# Patient Record
Sex: Female | Born: 1955
Health system: Southern US, Community
[De-identification: ages and names within clinical notes are randomized; demographics above are authoritative.]

## PROBLEM LIST (undated history)

## (undated) DIAGNOSIS — C801 Malignant (primary) neoplasm, unspecified: Secondary | ICD-10-CM

## (undated) DIAGNOSIS — E669 Obesity, unspecified: Secondary | ICD-10-CM

## (undated) DIAGNOSIS — B009 Herpesviral infection, unspecified: Secondary | ICD-10-CM

## (undated) DIAGNOSIS — N3281 Overactive bladder: Secondary | ICD-10-CM

## (undated) DIAGNOSIS — K219 Gastro-esophageal reflux disease without esophagitis: Secondary | ICD-10-CM

## (undated) DIAGNOSIS — F329 Major depressive disorder, single episode, unspecified: Secondary | ICD-10-CM

## (undated) DIAGNOSIS — M1990 Unspecified osteoarthritis, unspecified site: Secondary | ICD-10-CM

## (undated) DIAGNOSIS — K209 Esophagitis, unspecified: Secondary | ICD-10-CM

## (undated) DIAGNOSIS — R351 Nocturia: Secondary | ICD-10-CM

## (undated) DIAGNOSIS — Z8489 Family history of other specified conditions: Secondary | ICD-10-CM

## (undated) DIAGNOSIS — R350 Frequency of micturition: Secondary | ICD-10-CM

## (undated) DIAGNOSIS — F32A Depression, unspecified: Secondary | ICD-10-CM

## (undated) DIAGNOSIS — N390 Urinary tract infection, site not specified: Secondary | ICD-10-CM

## (undated) DIAGNOSIS — R3915 Urgency of urination: Secondary | ICD-10-CM

## (undated) DIAGNOSIS — K2270 Barrett's esophagus without dysplasia: Secondary | ICD-10-CM

## (undated) DIAGNOSIS — E7800 Pure hypercholesterolemia, unspecified: Secondary | ICD-10-CM

## (undated) DIAGNOSIS — Z5189 Encounter for other specified aftercare: Secondary | ICD-10-CM

## (undated) DIAGNOSIS — IMO0001 Reserved for inherently not codable concepts without codable children: Secondary | ICD-10-CM

## (undated) HISTORY — PX: OTHER SURGICAL HISTORY: SHX169

## (undated) HISTORY — DX: Urinary tract infection, site not specified: N39.0

## (undated) HISTORY — DX: Obesity, unspecified: E66.9

## (undated) HISTORY — DX: Major depressive disorder, single episode, unspecified: F32.9

## (undated) HISTORY — DX: Barrett's esophagus without dysplasia: K22.70

## (undated) HISTORY — DX: Overactive bladder: N32.81

## (undated) HISTORY — PX: KNEE ARTHROSCOPY: SHX127

## (undated) HISTORY — PX: ARTHROSCOPY KNEE W/ DRILLING: SUR92

## (undated) HISTORY — PX: UPPER GASTROINTESTINAL ENDOSCOPY: SHX188

## (undated) HISTORY — DX: Esophagitis, unspecified: K20.9

## (undated) HISTORY — DX: Herpesviral infection, unspecified: B00.9

## (undated) HISTORY — DX: Encounter for other specified aftercare: Z51.89

## (undated) HISTORY — PX: HIP FRACTURE SURGERY: SHX118

## (undated) HISTORY — DX: Depression, unspecified: F32.A

## (undated) HISTORY — PX: COLONOSCOPY: SHX174

## (undated) HISTORY — DX: Malignant (primary) neoplasm, unspecified: C80.1

## (undated) HISTORY — DX: Gastro-esophageal reflux disease without esophagitis: K21.9

## (undated) HISTORY — DX: Unspecified osteoarthritis, unspecified site: M19.90

---
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
## 1993-01-15 HISTORY — PX: SINUS EXPLORATION: SHX5214

## 1998-01-15 HISTORY — PX: CARPAL TUNNEL RELEASE: SHX101

## 1998-04-13 ENCOUNTER — Ambulatory Visit (HOSPITAL_BASED_OUTPATIENT_CLINIC_OR_DEPARTMENT_OTHER): Admission: RE | Admit: 1998-04-13 | Discharge: 1998-04-13 | Payer: Self-pay | Admitting: Orthopedic Surgery

## 1998-08-01 ENCOUNTER — Encounter: Payer: Self-pay | Admitting: Occupational Medicine

## 1998-08-01 ENCOUNTER — Ambulatory Visit: Admission: RE | Admit: 1998-08-01 | Discharge: 1998-08-01 | Payer: Self-pay | Admitting: Occupational Medicine

## 2003-04-16 ENCOUNTER — Ambulatory Visit (HOSPITAL_COMMUNITY): Admission: RE | Admit: 2003-04-16 | Discharge: 2003-04-16 | Payer: Self-pay | Admitting: Gastroenterology

## 2004-02-23 ENCOUNTER — Encounter: Admission: RE | Admit: 2004-02-23 | Discharge: 2004-02-23 | Payer: Self-pay | Admitting: Family Medicine

## 2004-11-29 ENCOUNTER — Encounter: Admission: RE | Admit: 2004-11-29 | Discharge: 2004-11-29 | Payer: Self-pay | Admitting: Family Medicine

## 2004-12-28 ENCOUNTER — Encounter (INDEPENDENT_AMBULATORY_CARE_PROVIDER_SITE_OTHER): Payer: Self-pay | Admitting: *Deleted

## 2004-12-28 ENCOUNTER — Ambulatory Visit (HOSPITAL_COMMUNITY): Admission: RE | Admit: 2004-12-28 | Discharge: 2004-12-28 | Payer: Self-pay | Admitting: Family Medicine

## 2005-01-15 HISTORY — PX: ESOPHAGOGASTRODUODENOSCOPY: SHX1529

## 2005-01-20 ENCOUNTER — Emergency Department (HOSPITAL_COMMUNITY): Admission: EM | Admit: 2005-01-20 | Discharge: 2005-01-20 | Payer: Self-pay | Admitting: Family Medicine

## 2005-06-29 LAB — HM COLONOSCOPY: HM Colonoscopy: NORMAL

## 2005-07-23 ENCOUNTER — Encounter: Admission: RE | Admit: 2005-07-23 | Discharge: 2005-07-23 | Payer: Self-pay | Admitting: Orthopedic Surgery

## 2006-03-26 ENCOUNTER — Emergency Department (HOSPITAL_COMMUNITY): Admission: EM | Admit: 2006-03-26 | Discharge: 2006-03-26 | Payer: Self-pay | Admitting: Emergency Medicine

## 2006-04-16 HISTORY — PX: HEMORRHOID SURGERY: SHX153

## 2007-10-07 ENCOUNTER — Encounter: Admission: RE | Admit: 2007-10-07 | Discharge: 2007-10-07 | Payer: Self-pay | Admitting: Family Medicine

## 2008-04-30 ENCOUNTER — Emergency Department (HOSPITAL_COMMUNITY): Admission: EM | Admit: 2008-04-30 | Discharge: 2008-04-30 | Payer: Self-pay | Admitting: Emergency Medicine

## 2009-02-27 ENCOUNTER — Encounter: Admission: RE | Admit: 2009-02-27 | Discharge: 2009-02-27 | Payer: Self-pay | Admitting: Orthopedic Surgery

## 2009-09-01 ENCOUNTER — Encounter: Admission: RE | Admit: 2009-09-01 | Discharge: 2009-09-01 | Payer: Self-pay | Admitting: Unknown Physician Specialty

## 2010-06-02 NOTE — Op Note (Signed)
Kristi Avila, Kristi Avila                             ACCOUNT NO.:  [id number]   MEDICAL RECORD NO.:  [id number]                   PATIENT TYPE:  AMB   LOCATION:  ENDO                                 FACILITY:  [facility]   PHYSICIAN:  John C. Madilyn Fireman, M.D.                 DATE OF BIRTH:  1955/10/11   DATE OF PROCEDURE:  04/16/2003  DATE OF DISCHARGE:                                 OPERATIVE REPORT   PROCEDURE:  Esophagogastroduodenoscopy with esophageal dilatation.   INDICATION FOR PROCEDURE:  Chronic reflux symptoms and intermittent solid  food dysphagia.   DESCRIPTION OF PROCEDURE:  The patient was placed in the left lateral  decubitus position and placed on the pulse monitor with continuous low-flow  oxygen delivered by nasal cannula.  She was sedated with 100 mcg IV fentanyl  and 10 mg IV Versed.  The Olympus video endoscope was advanced under direct  vision into the oropharynx and esophagus.  The esophagus was straight and of  normal caliber with the squamocolumnar line at 37 cm above a 1.5 cm hiatal  hernia, and there was a circumferential, widely patent but distinct lower  esophageal ring.  There was no visible esophagitis or stricture.  The  stomach was entered, and a small amount of liquid secretions were suctioned  from the fundus.  Retroflexed view of the cardia was unremarkable.  The  fundus, body, antrum, and pylorus all appeared normal.  The duodenum was  entered, and both the bulb and second portion were well-inspected and  appeared to be within normal limits.  The scope was advanced as far as  possible down the distal duodenum and Savary guidewire placed.  The scope  was then withdrawn, and a 17 mm Savary dilator was passed over the guidewire  mild dilatation and no blood seen on withdrawal.  The dilator was removed  together with the wire and the patient returned to the recovery room in  stable condition.  She tolerated the procedure well, and there were no  immediate  complications.   IMPRESSION:  Lower esophageal ring with small hiatal hernia.   PLAN:  1. Advance diet and observe response to dilatation.  2. Continue Protonix which is helping her reflux symptoms.                                               John C. Madilyn Fireman, M.D.    J[facility]/MEDQ  D:  04/16/2003  T:  04/16/2003  Job:  [id number]   cc:   [place]ale Haven. Andrey Campanile, M.D.  [address]  [place]  [place] [address]  Fax: [phone]

## 2012-05-08 ENCOUNTER — Telehealth: Payer: Self-pay | Admitting: Physician Assistant

## 2012-05-09 NOTE — Telephone Encounter (Signed)
1- Bladder Medicine: At OV 03/26/12 started Detrol LA 2mg . This is the low dose. Will increase to next dose--4mg . Tell her if this does not work, let me know and will change to different medicine.  2-Simvastatin: Tell pt that a new warning came out about Simvastatin a while ago. It lists certain meds that are not to be used in combination with Simvastatin. She is not on any of those medicatons. Tell her Simvastatin has been out for >15 years. Millions of people have been on this medicine. She does not need to worry.   Send in new RX for Detrol LA 4mg  one po QD   # 30 / prn refills

## 2012-05-09 NOTE — Telephone Encounter (Signed)
Pt called.  Understands about increase of Detrol.  Then got into discussion about Simvastatin.  Told not on any medications that were of concern in literature.  Pt then stated was worried because when reading and talked to pharmacist, one of the concerns was with patients that drank beer and she drinks beer on the weekends.  Then expressed concern over body and joint aches she is also having and that can be side effect of statins.  She wanted to know if she could use herbal supplement "red yeast"  Told her we usually tell patients with myalgias on statins to stop statin for two weeks (do not replace with anything else) then call us and let us know how they are doing.  She has opted to do that.

## 2012-05-12 ENCOUNTER — Other Ambulatory Visit: Payer: Self-pay | Admitting: Family Medicine

## 2012-05-12 MED ORDER — TOLTERODINE TARTRATE ER 4 MG PO CP24: 4.0000 mg | ORAL_CAPSULE | Freq: Every day | ORAL | Status: DC

## 2012-05-12 NOTE — Telephone Encounter (Signed)
detrol increased to 4mg  /day. Pt states pharmacy never received.  Rx sent again

## 2012-05-24 ENCOUNTER — Emergency Department (INDEPENDENT_AMBULATORY_CARE_PROVIDER_SITE_OTHER)
Admission: EM | Admit: 2012-05-24 | Discharge: 2012-05-24 | Discharge status: Against Medical Advice | Payer: BC Managed Care – PPO | Source: Home / Self Care

## 2012-05-24 ENCOUNTER — Encounter (HOSPITAL_COMMUNITY): Payer: Self-pay

## 2012-05-24 DIAGNOSIS — R110 Nausea: Secondary | ICD-10-CM

## 2012-05-24 DIAGNOSIS — R079 Chest pain, unspecified: Secondary | ICD-10-CM

## 2012-05-24 DIAGNOSIS — R1013 Epigastric pain: Secondary | ICD-10-CM

## 2012-05-24 HISTORY — DX: Pure hypercholesterolemia, unspecified: E78.00

## 2012-05-24 HISTORY — DX: Reserved for inherently not codable concepts without codable children: IMO0001

## 2012-05-24 HISTORY — DX: Gastro-esophageal reflux disease without esophagitis: K21.9

## 2012-05-24 MED ORDER — SODIUM CHLORIDE 0.9 % IV SOLN: Freq: Once | INTRAVENOUS | Status: DC

## 2012-05-24 NOTE — ED Notes (Signed)
Further discussion w patient, signed AMA form; pt understands she is to go to the ED if develops worsening syx

## 2012-05-24 NOTE — ED Provider Notes (Signed)
History     CSN: [id number]  Arrival date & time 05/24/12  1530   First MD Initiated Contact with Patient 05/24/12 1551      Chief Complaint  Patient presents with  . Chest Pain    (Consider location/radiation/quality/duration/timing/severity/associated sxs/prior treatment) HPI Comments: While eating lunch today this 57 year old female experienced sudden acute epigastric pain radiating superiorly retrosternally into the right chest. She describes it as a fullness feeling. It is associated with nausea but no vomiting. She initially thought this was due to her reflux and sheet to GI medications some of which included the Zegerid and Reglan. These did not help her symptoms. She stated she wanted to be sure that she was not having a heart attack. She appears generally well and in no acute distress. She is warm and dry. Showing no signs of respiratory distress.   Past Medical History  Diagnosis Date  . Reflux   . High cholesterol     History reviewed. No pertinent past surgical history.  No family history on file.  History  Substance Use Topics  . Smoking status: Not on file  . Smokeless tobacco: Not on file  . Alcohol Use: Not on file    OB History   Grav Para Term Preterm Abortions TAB SAB Ect Mult Living                  Review of Systems  Constitutional: Negative.   HENT: Negative.   Respiratory: Negative.   Cardiovascular: Positive for chest pain. Negative for palpitations.  Gastrointestinal: Positive for nausea and abdominal pain. Negative for vomiting.  Genitourinary: Negative.   Neurological: Negative.     Allergies  Cephalosporins; Codeine; and Sulfa antibiotics  Home Medications   Current Outpatient Rx  Name  Route  Sig  Dispense  Refill  . fluticasone (FLONASE) 50 MCG/ACT nasal spray   Nasal   Place 2 sprays into the nose daily.         Marland Kitchen acyclovir (ZOVIRAX) 400 MG tablet   Oral   Take 400 mg by mouth every 4 (four) hours while awake.          . DULoxetine (CYMBALTA) 60 MG capsule   Oral   Take 60 mg by mouth daily.         . meloxicam (MOBIC) 15 MG tablet   Oral   Take 15 mg by mouth daily.         . metoCLOPramide (REGLAN) 5 MG tablet   Oral   Take 5 mg by mouth 4 (four) times daily.         Marland Kitchen omeprazole-sodium bicarbonate (ZEGERID) 40-1100 MG per capsule   Oral   Take 1 capsule by mouth daily before breakfast.         . tolterodine (DETROL LA) 4 MG 24 hr capsule   Oral   Take 1 capsule (4 mg total) by mouth daily.   30 capsule   5     BP 112/62  Pulse 69  Temp(Src) 97.9 F (36.6 C) (Oral)  Resp 16  SpO2 100%  Physical Exam  Nursing note and vitals reviewed. Constitutional: She is oriented to person, place, and time. She appears well-developed and well-nourished. No distress.  Eyes: EOM are normal. Pupils are equal, round, and reactive to light.  Neck: Normal range of motion. Neck supple.  Cardiovascular: Normal rate, regular rhythm and intact distal pulses.   Pulmonary/Chest: Effort normal and breath sounds normal. No respiratory distress. She has no  wheezes.  Abdominal: Soft. She exhibits no mass. There is tenderness. There is no rebound.  Epigastric and right upper quadrant tenderness. Positive Murphy sign.  Musculoskeletal: She exhibits no edema and no tenderness.  Lymphadenopathy:    She has no cervical adenopathy.  Neurological: She is alert and oriented to person, place, and time.  Skin: Skin is warm and dry. No rash noted.  Psychiatric: She has a normal mood and affect.    ED Course  Procedures (including critical care time)  Labs Reviewed - No data to display No results found.   1. Chest pain at rest   2. Epigastric abdominal pain   3. Nausea       MDM  This 57 year old female is being transferred to the emergency department for evaluation of epigastric pain radiating into the chest describing it as a fullness. Although she has a normal EKG she has few risk factors  including postmenopausal and dyslipidemia. She is in stable condition with some improvement in the fullness in her chest.         Hayden Rasmussen, NP 05/24/12 1628  Hayden Rasmussen, NP 05/24/12 [phone]

## 2012-05-24 NOTE — ED Provider Notes (Signed)
Medical screening examination/treatment/procedure(s) were performed by non-physician practitioner and as supervising physician I was immediately available for consultation/collaboration.  Leslee Home, M.D.  Reuben Likes, MD 05/24/12 2001

## 2012-05-24 NOTE — ED Notes (Signed)
Went into room to start IV and place on monitor , when she refused to allow IV start, and is declining transfer to ED for further evaluation; wants to make an appt w her PCP on Monday for further testing

## 2012-05-24 NOTE — ED Notes (Signed)
Reports a pressure or heaviness in her epigastric area since lunch today, not improved after taking her PM dose of reglan ; states her pain does not radiate, no sweating or vomiting w the sensation; "feels weird , wanted to have it checked out to be sure I wasn't having a heart attack"

## 2012-05-26 ENCOUNTER — Ambulatory Visit (INDEPENDENT_AMBULATORY_CARE_PROVIDER_SITE_OTHER): Payer: BC Managed Care – PPO | Admitting: Physician Assistant

## 2012-05-26 ENCOUNTER — Encounter: Payer: Self-pay | Admitting: Physician Assistant

## 2012-05-26 VITALS — BP 104/62 | HR 72 | Temp 97.5°F | Resp 18 | Ht 64.75 in | Wt 220.0 lb

## 2012-05-26 DIAGNOSIS — E785 Hyperlipidemia, unspecified: Secondary | ICD-10-CM

## 2012-05-26 DIAGNOSIS — R0789 Other chest pain: Secondary | ICD-10-CM

## 2012-05-26 MED ORDER — PRAVASTATIN SODIUM 10 MG PO TABS: 10.0000 mg | ORAL_TABLET | Freq: Every day | ORAL | Status: DC

## 2012-05-27 NOTE — Progress Notes (Signed)
Patient ID: Kristi Avila MRN: [id number], DOB: 1956-01-01, 57 y.o. Date of Encounter: 05/27/2012, 9:07 AM    Chief Complaint:  Chief Complaint  Patient presents with  . ER folllow up c/o chest pain  . has been off simvastatin  feels great what to do now     HPI: 57 y.o. year old female reports that she went to [facility] over weekend-05/24/12. Had pain in  Right chest and nausea. States that they did EKG but no labs. She was told to f/u here. Says that she has had no further pain or nausea since that one episode. Says she went home and took reflux med and slept with head of bed raised and symptoms resolved. Has had no exertional chest discomfort,  Shortness of breath, nausea, neck or arm discomfort.   Also states that she was having diffuse achiness ove rher entire body. Stopped Simvastatin 2 weeks ago and feels so much better. Myalgias resolved.   Home Meds: See attached medication section for any medications that were entered at today's visit. The computer does not put those onto this list.The following list is a list of meds entered prior to today's visit.   Current Outpatient Prescriptions on File Prior to Visit  Medication Sig Dispense Refill  . acyclovir (ZOVIRAX) 400 MG tablet Take 400 mg by mouth daily.       . DULoxetine (CYMBALTA) 60 MG capsule Take 60 mg by mouth daily.      . fluticasone (FLONASE) 50 MCG/ACT nasal spray Place 2 sprays into the nose daily.      . meloxicam (MOBIC) 15 MG tablet Take 15 mg by mouth daily.      . metoCLOPramide (REGLAN) 5 MG tablet Take 5 mg by mouth 4 (four) times daily.      Marland Kitchen omeprazole-sodium bicarbonate (ZEGERID) 40-1100 MG per capsule Take 1 capsule by mouth daily before breakfast.      . tolterodine (DETROL LA) 4 MG 24 hr capsule Take 1 capsule (4 mg total) by mouth daily.  30 capsule  5   No current facility-administered medications on file prior to visit.    Allergies:  Allergies  Allergen Reactions  . Cephalosporins   . Codeine   .  Simvastatin     myalgias  . Sulfa Antibiotics       Review of Systems: See HPI for pertinent ROS. All other ROS negative.    Physical Exam: Blood pressure 104/62, pulse 72, temperature 97.5 F (36.4 C), temperature source Oral, resp. rate 18, height 5' 4.75" (1.645 m), weight 220 lb (99.791 kg)., Body mass index is 36.88 kg/(m^2). General: Obese WF.  Appears in no acute distress. Neck: Supple. No thyromegaly. No lymphadenopathy. Lungs: Clear bilaterally to auscultation without wheezes, rales, or rhonchi. Breathing is unlabored. Heart: Regular rhythm. No murmurs, rubs, or gallops. Abdomen: Soft, non-tender, non-distended with normoactive bowel sounds. No hepatomegaly. No rebound/guarding. No obvious abdominal masses. Msk:  Strength and tone normal for age. Extremities/Skin: Warm and dry. No clubbing or cyanosis. No edema. No rashes or suspicious lesions. Neuro: Alert and oriented X 3. Moves all extremities spontaneously. Gait is normal. CNII-XII grossly in tact. Psych:  Responds to questions appropriately with a normal affect.     ASSESSMENT AND PLAN:  57 y.o. year old female with  1. Other and unspecified hyperlipidemia Will try Pravachol. Call me if develops myalgias. O/W, if tolerates med, will recheck lab in 6 weeks.  - pravastatin (PRAVACHOL) 10 MG tablet; Take 1 tablet (10  mg total) by mouth daily.  Dispense: 30 tablet; Refill: 1 - Lipid panel; Future - Hepatic function panel; Future  2. Atypical chest pain This sounds c/w GERD. Her symptoms have resolved and her exam is normal. If has recurrent symptoms, f/u.   Signed, [address] [place], [place], [facility] 05/27/2012 9:07 AM

## 2012-06-02 ENCOUNTER — Telehealth: Payer: Self-pay | Admitting: *Deleted

## 2012-06-02 DIAGNOSIS — E785 Hyperlipidemia, unspecified: Secondary | ICD-10-CM

## 2012-06-02 MED ORDER — EZETIMIBE 10 MG PO TABS: 10.0000 mg | ORAL_TABLET | Freq: Every day | ORAL | Status: DC

## 2012-06-02 NOTE — Telephone Encounter (Signed)
Rec Zetia. This works in a completely different way. Just deceases absorption of cholesterol in intestine. Does not cause muscle aches.Recheck Fasting Labs in 2-3 months. Send in Rx: Zetia 10mg  one po QD # 30/ 2 refills/ Place Order for FLP/LFT

## 2012-06-02 NOTE — Telephone Encounter (Signed)
Having myalgias with Pravachol??  Feels just can't take statins.  States family history of intolerance to statins.  What can she do that does not require a statin?  Please advise.

## 2012-06-04 ENCOUNTER — Other Ambulatory Visit: Payer: Self-pay | Admitting: Physician Assistant

## 2012-06-04 NOTE — Telephone Encounter (Signed)
Medication refilled per protocol. 

## 2012-06-07 NOTE — Telephone Encounter (Signed)
Pt aware andf med already called in on 05/19

## 2012-07-23 DIAGNOSIS — K2270 Barrett's esophagus without dysplasia: Secondary | ICD-10-CM

## 2012-07-23 DIAGNOSIS — K209 Esophagitis, unspecified without bleeding: Secondary | ICD-10-CM

## 2012-07-23 HISTORY — DX: Esophagitis, unspecified without bleeding: K20.90

## 2012-07-23 HISTORY — DX: Barrett's esophagus without dysplasia: K22.70

## 2012-08-25 ENCOUNTER — Other Ambulatory Visit: Payer: BC Managed Care – PPO

## 2012-08-25 DIAGNOSIS — E785 Hyperlipidemia, unspecified: Secondary | ICD-10-CM

## 2012-08-25 LAB — HEPATIC FUNCTION PANEL
ALT: 27 U/L (ref 0–35)
Bilirubin, Direct: 0.1 mg/dL (ref 0.0–0.3)

## 2012-08-25 LAB — LIPID PANEL
Cholesterol: 306 mg/dL — ABNORMAL HIGH (ref 0–200)
HDL: 71 mg/dL (ref >39)
LDL Cholesterol: 200 mg/dL — ABNORMAL HIGH (ref 0–99)
Total CHOL/HDL Ratio: 4.3 Ratio

## 2012-08-27 ENCOUNTER — Other Ambulatory Visit: Payer: Self-pay | Admitting: Orthopedic Surgery

## 2012-08-28 ENCOUNTER — Encounter: Payer: Self-pay | Admitting: Physician Assistant

## 2012-08-28 ENCOUNTER — Ambulatory Visit (INDEPENDENT_AMBULATORY_CARE_PROVIDER_SITE_OTHER): Payer: BC Managed Care – PPO | Admitting: Physician Assistant

## 2012-08-28 VITALS — BP 110/70 | HR 80 | Temp 98.2°F | Resp 18 | Ht 65.0 in | Wt 224.0 lb

## 2012-08-28 DIAGNOSIS — E7800 Pure hypercholesterolemia, unspecified: Secondary | ICD-10-CM

## 2012-08-28 DIAGNOSIS — N318 Other neuromuscular dysfunction of bladder: Secondary | ICD-10-CM

## 2012-08-28 DIAGNOSIS — F411 Generalized anxiety disorder: Secondary | ICD-10-CM

## 2012-08-28 DIAGNOSIS — N3281 Overactive bladder: Secondary | ICD-10-CM | POA: Insufficient documentation

## 2012-08-28 DIAGNOSIS — F3289 Other specified depressive episodes: Secondary | ICD-10-CM

## 2012-08-28 DIAGNOSIS — E669 Obesity, unspecified: Secondary | ICD-10-CM

## 2012-08-28 DIAGNOSIS — N950 Postmenopausal bleeding: Secondary | ICD-10-CM

## 2012-08-28 DIAGNOSIS — F32A Depression, unspecified: Secondary | ICD-10-CM | POA: Insufficient documentation

## 2012-08-28 DIAGNOSIS — F329 Major depressive disorder, single episode, unspecified: Secondary | ICD-10-CM

## 2012-08-28 DIAGNOSIS — F419 Anxiety disorder, unspecified: Secondary | ICD-10-CM | POA: Insufficient documentation

## 2012-08-28 DIAGNOSIS — K219 Gastro-esophageal reflux disease without esophagitis: Secondary | ICD-10-CM

## 2012-08-28 DIAGNOSIS — E785 Hyperlipidemia, unspecified: Secondary | ICD-10-CM

## 2012-08-28 MED ORDER — FESOTERODINE FUMARATE ER 8 MG PO TB24: 8.0000 mg | ORAL_TABLET | Freq: Every day | ORAL | Status: DC

## 2012-08-28 MED ORDER — MELOXICAM 15 MG PO TABS: 15.0000 mg | ORAL_TABLET | Freq: Every day | ORAL | Status: DC

## 2012-08-28 MED ORDER — DULOXETINE HCL 60 MG PO CPEP: ORAL_CAPSULE | ORAL | Status: DC

## 2012-08-28 MED ORDER — EZETIMIBE 10 MG PO TABS: 10.0000 mg | ORAL_TABLET | Freq: Every day | ORAL | Status: DC

## 2012-08-28 NOTE — Progress Notes (Signed)
Patient ID: Kristi Avila MRN: [id number], DOB: 01-09-1956, 57 y.o. Date of Encounter: @DATE @  Chief Complaint:  Chief Complaint  Patient presents with  . routine check up    has had labs  having knee replacement next month  feels zetia is making her achy    HPI: 57 y.o. year old female  presents for routine followup.  Hyperlipidemia:  The simvastatin and pravastatin calls it caused significant myalgias. Most recently she was prescribed steady. However she stopped that often because she thought that was causing some body aches off A. She's currently taking over-the-counter fish oil.  Postmenopausal bleeding she states that she had had no menses for 2 years. However she did have 2 days of bleeding in the month of June. No other episodes of bleeding. No abdominal or pelvic pain.  At her office visit with me on 03/26/2012 she reported  symptoms of overactive bladder. At that visit she reported several month history of urinary frequency nocturia and leaking at times. She was started on Detrol XL 2 mg daily. Since then she has called reporting continued symptoms and the dose was increased to 4 mg. She reports that even with the 4 mg she's continuing to have symptoms. She wakes up 4 to 5 times at night to urinate. As well she says that at the end of urination she has some leaking.  she had no adverse effects to the medication.  Anxiety/depression she is  still on the Cymbalta 60 mg 2 tablet daily. This continues to control her anxiety and depression and her mood is stable on medication.   Past Medical History  Diagnosis Date  . Reflux   . High cholesterol   . Overactive bladder   . Obesity   . Anxiety   . Depression   . GERD (gastroesophageal reflux disease)      Home Meds: See attached medication section for current medication list. Any medications entered into computer today will not appear on this note's list. The medications listed below were entered prior to today. Current  Outpatient Prescriptions on File Prior to Visit  Medication Sig Dispense Refill  . acyclovir (ZOVIRAX) 400 MG tablet Take 400 mg by mouth daily.       Marland Kitchen BLACK COHOSH PO Take by mouth 2 (two) times daily.      . Cholecalciferol (VITAMIN D PO) Take by mouth daily.      . fluticasone (FLONASE) 50 MCG/ACT nasal spray INSTILL 2 SPRAYS IN EACH NOSTRIL EVERY DAY  5 g  11  . metoCLOPramide (REGLAN) 5 MG tablet Take 5 mg by mouth 4 (four) times daily.      . Multiple Vitamin (MULTIVITAMIN) tablet Take 1 tablet by mouth daily.      Marland Kitchen omeprazole-sodium bicarbonate (ZEGERID) 40-1100 MG per capsule Take 1 capsule by mouth daily before breakfast.      . CALCIUM PO Take by mouth daily.       No current facility-administered medications on file prior to visit.    Allergies:  Allergies  Allergen Reactions  . Cephalosporins   . Codeine   . Pravastatin     Myalgias  . Simvastatin     myalgias  . Sulfa Antibiotics     History   Social History  . Marital Status: Unknown    Spouse Name: N/A    Number of Children: N/A  . Years of Education: N/A   Occupational History  . Not on file.   Social History Main Topics  .  Smoking status: Never Smoker   . Smokeless tobacco: Never Used  . Alcohol Use: Yes  . Drug Use: No  . Sexual Activity: Not on file   Other Topics Concern  . Not on file   Social History Narrative  . No narrative on file    History reviewed. No pertinent family history.   Review of Systems:  See HPI for pertinent ROS. All other ROS negative.    Physical Exam: Blood pressure 110/70, pulse 80, temperature 98.2 F (36.8 C), temperature source Oral, resp. rate 18, height 5\' 5"  (1.651 m), weight 224 lb (101.606 kg)., Body mass index is 37.28 kg/(m^2). General: Obese white female. Appears in no acute distress. Neck: Supple. No thyromegaly. No lymphadenopathy.No carotid bruits. Lungs: Clear bilaterally to auscultation without wheezes, rales, or rhonchi. Breathing is  unlabored. Heart: RRR with S1 S2. No murmurs, rubs, or gallops. Abdomen: Soft, non-tender, non-distended with normoactive bowel sounds. No hepatomegaly. No rebound/guarding. No obvious abdominal masses. Musculoskeletal:  Strength and tone normal for age. Extremities/Skin: Warm and dry. No edema. No rashes or suspicious lesions. Neuro: Alert and oriented X 3. Moves all extremities spontaneously. Gait is normal. CNII-XII grossly in tact. Psych:  Responds to questions appropriately with a normal affect.     ASSESSMENT AND PLAN:  57 y.o. year old female with  1. Anxiety - DULoxetine (CYMBALTA) 60 MG capsule; Take 2 daily  Dispense: 60 capsule; Refill: 5  2. Depression - DULoxetine (CYMBALTA) 60 MG capsule; Take 2 daily  Dispense: 60 capsule; Refill: 5  3. GERD (gastroesophageal reflux disease)  4. High cholesterol She is agreeable to restart the Zetia. Discussed that this medication should not cause myalgias. However if she feels that she definitely feels worse on the medication and this may have to be stopped. She has a history of intolerance to multiple statins. Recent LDL is 200 since she really needs to be on therapy if possible. - ezetimibe (ZETIA) 10 MG tablet; Take 1 tablet (10 mg total) by mouth daily.  Dispense: 90 tablet; Refill: 1  5. Obesity  6. Overactive bladder Still with symptoms on Detrol LA 4 mg. Will change to Toviaz - fesoterodine (TOVIAZ) 8 MG TB24 tablet; Take 1 tablet (8 mg total) by mouth daily.  Dispense: 30 tablet; Refill: 11  7. Postmenopausal bleeding Need to rule out uterine cancer - Ambulatory referral to Obstetrics / Gynecology  She had a complete physical exam 06/04/2011. All preventive care was abated at that time. Murray Hodgkins [place], [place], [facility] 08/28/2012 3:05 PM

## 2012-08-29 ENCOUNTER — Other Ambulatory Visit: Payer: Self-pay | Admitting: Orthopedic Surgery

## 2012-09-10 ENCOUNTER — Encounter: Payer: Self-pay | Admitting: Physician Assistant

## 2012-09-10 ENCOUNTER — Ambulatory Visit (INDEPENDENT_AMBULATORY_CARE_PROVIDER_SITE_OTHER): Payer: BC Managed Care – PPO | Admitting: Physician Assistant

## 2012-09-10 VITALS — HR 68 | Temp 98.0°F | Resp 18 | Wt 224.0 lb

## 2012-09-10 DIAGNOSIS — M79609 Pain in unspecified limb: Secondary | ICD-10-CM

## 2012-09-10 DIAGNOSIS — M79662 Pain in left lower leg: Secondary | ICD-10-CM

## 2012-09-10 NOTE — Progress Notes (Signed)
Patient ID: Kristi Avila MRN: [id number], DOB: [birth year], 56 y.o. Date of Encounter: 09/10/2012, 4:34 PM    Chief Complaint:  Chief Complaint  Patient presents with  . pain left outer lower leg over 1 week     HPI: 57 y.o. year old white female reports that she has had pain in the back of her left calf for the past one to 2 weeks. She's had no trauma or injury. She has had no episodes of severe muscle spasms /cramps to cause  Pain. She has done no exercise or activity to cause this pain. She is actually been at the beach on vacation for the past week. She did very minimal wall pain and no other type of exercise.   She is concerned about possible blood clots and wants to have tests to make sure that she does not have a blood clot present. As well she has read that Zetia can cause some muscle pain. However she does note that she has had no other muscles with any pain-- only this left calf.  Home Meds: See attached medication section for any medications that were entered at today's visit. The computer does not put those onto this list.The following list is a list of meds entered prior to today's visit.   Current Outpatient Prescriptions on File Prior to Visit  Medication Sig Dispense Refill  . acyclovir (ZOVIRAX) 400 MG tablet Take 400 mg by mouth daily.       Marland Kitchen BLACK COHOSH PO Take by mouth 2 (two) times daily.      Marland Kitchen CALCIUM PO Take by mouth daily.      . Cholecalciferol (VITAMIN D PO) Take by mouth daily.      . DULoxetine (CYMBALTA) 60 MG capsule Take 2 daily  60 capsule  5  . ezetimibe (ZETIA) 10 MG tablet Take 1 tablet (10 mg total) by mouth daily.  90 tablet  1  . fesoterodine (TOVIAZ) 8 MG TB24 tablet Take 1 tablet (8 mg total) by mouth daily.  30 tablet  11  . fluticasone (FLONASE) 50 MCG/ACT nasal spray INSTILL 2 SPRAYS IN EACH NOSTRIL EVERY DAY  5 g  11  . meloxicam (MOBIC) 15 MG tablet Take 1 tablet (15 mg total) by mouth daily.  30 tablet  5  . metoCLOPramide (REGLAN) 5 MG  tablet Take 5 mg by mouth 4 (four) times daily.      . Multiple Vitamin (MULTIVITAMIN) tablet Take 1 tablet by mouth daily.      Marland Kitchen omeprazole-sodium bicarbonate (ZEGERID) 40-1100 MG per capsule Take 1 capsule by mouth daily before breakfast.       No current facility-administered medications on file prior to visit.    Allergies:  Allergies  Allergen Reactions  . Cephalosporins   . Codeine   . Pravastatin     Myalgias  . Simvastatin     myalgias  . Sulfa Antibiotics       Review of Systems: See HPI for pertinent ROS. All other ROS negative.    Physical Exam: Pulse 68, temperature 98 F (36.7 C), temperature source Oral, resp. rate 18, weight 224 lb (101.606 kg)., Body mass index is 37.28 kg/(m^2). General: Obese white female. Appears in no acute distress. Lungs: Clear bilaterally to auscultation without wheezes, rales, or rhonchi. Breathing is unlabored. Heart: Regular rhythm. No murmurs, rubs, or gallops. Msk:  Strength and tone normal for age. Extremities/Skin: Left calf: There is no erythema. No warmth. No palpable cord. The posterior  aspect of her calf is tender with palpation of the muscle itself. Neuro: Alert and oriented X 3. Moves all extremities spontaneously. Gait is normal. CNII-XII grossly in tact. Psych:  Responds to questions appropriately with a normal affect.     ASSESSMENT AND PLAN:  57 y.o. year old female with  1. Calf pain, left We'll obtain venous Doppler to rule out DVT.  If this is negative then I think it is definitely secondary to muscle pain. I do not think there is any reason to obtain an x-ray as the pain is in the muscle area not deep in the bone. If the Doppler is negative then I recommend stretching. Also recommend holding the zetia for 2 weeks to see whether the pain resolved with holding the zetia. Can then followup with me in 2 weeks to let me now whether the symptoms have resolved and to discuss whether to retry the zetia or not. - Lower  Extremity Venous Duplex Left; Future   Signed, Shon Hale [place], [place], [facility] 09/10/2012 4:34 PM

## 2012-09-11 ENCOUNTER — Encounter (HOSPITAL_COMMUNITY): Payer: Self-pay | Admitting: Pharmacy Technician

## 2012-09-11 ENCOUNTER — Other Ambulatory Visit: Payer: BC Managed Care – PPO

## 2012-09-11 ENCOUNTER — Ambulatory Visit
Admission: RE | Admit: 2012-09-11 | Discharge: 2012-09-11 | Disposition: A | Discharge status: Home / Self Care | Payer: BC Managed Care – PPO | Source: Ambulatory Visit | Attending: Physician Assistant | Admitting: Physician Assistant

## 2012-09-11 DIAGNOSIS — M79662 Pain in left lower leg: Secondary | ICD-10-CM

## 2012-09-12 ENCOUNTER — Encounter: Payer: Self-pay | Admitting: Family Medicine

## 2012-09-12 ENCOUNTER — Other Ambulatory Visit: Payer: BC Managed Care – PPO

## 2012-09-12 DIAGNOSIS — B009 Herpesviral infection, unspecified: Secondary | ICD-10-CM | POA: Insufficient documentation

## 2012-09-12 DIAGNOSIS — M159 Polyosteoarthritis, unspecified: Secondary | ICD-10-CM

## 2012-09-16 ENCOUNTER — Other Ambulatory Visit (HOSPITAL_COMMUNITY): Payer: Self-pay | Admitting: Orthopedic Surgery

## 2012-09-17 ENCOUNTER — Encounter (HOSPITAL_COMMUNITY): Payer: Self-pay

## 2012-09-17 ENCOUNTER — Encounter (HOSPITAL_COMMUNITY)
Admission: RE | Admit: 2012-09-17 | Discharge: 2012-09-17 | Disposition: A | Discharge status: Home / Self Care | Payer: BC Managed Care – PPO | Source: Ambulatory Visit | Attending: Orthopedic Surgery | Admitting: Orthopedic Surgery

## 2012-09-17 DIAGNOSIS — Z01812 Encounter for preprocedural laboratory examination: Secondary | ICD-10-CM | POA: Insufficient documentation

## 2012-09-17 HISTORY — DX: Frequency of micturition: R35.0

## 2012-09-17 HISTORY — DX: Nocturia: R35.1

## 2012-09-17 HISTORY — DX: Urgency of urination: R39.15

## 2012-09-17 LAB — CBC
MCH: 30 pg (ref 26.0–34.0)
MCHC: 32.9 g/dL (ref 30.0–36.0)
MCV: 91.1 fL (ref 78.0–100.0)
Platelets: 333 10*3/uL (ref 150–400)
RDW: 12.5 % (ref 11.5–15.5)

## 2012-09-17 LAB — COMPREHENSIVE METABOLIC PANEL
AST: 19 U/L (ref 0–37)
Albumin: 3.8 g/dL (ref 3.5–5.2)
CO2: 28 mEq/L (ref 19–32)
Calcium: 9.1 mg/dL (ref 8.4–10.5)
Creatinine, Ser: 0.91 mg/dL (ref 0.50–1.10)
GFR calc non Af Amer: 70 mL/min — ABNORMAL LOW (ref >90)
Sodium: 134 mEq/L — ABNORMAL LOW (ref 135–145)
Total Protein: 6.9 g/dL (ref 6.0–8.3)

## 2012-09-17 LAB — SURGICAL PCR SCREEN
MRSA, PCR: NEGATIVE
Staphylococcus aureus: POSITIVE — AB

## 2012-09-17 LAB — PROTIME-INR: INR: 0.95 (ref 0.00–1.49)

## 2012-09-17 LAB — URINALYSIS, ROUTINE W REFLEX MICROSCOPIC
Glucose, UA: NEGATIVE mg/dL
Ketones, ur: NEGATIVE mg/dL
Leukocytes, UA: NEGATIVE
Protein, ur: NEGATIVE mg/dL
Urobilinogen, UA: 0.2 mg/dL (ref 0.0–1.0)

## 2012-09-17 NOTE — Progress Notes (Signed)
PCR report faxed and called to Harley Hallmark at office / also faxed to Dr. Lequita Halt / Pt notified

## 2012-09-17 NOTE — Patient Instructions (Addendum)
Kristi Avila  09/17/2012                           YOUR PROCEDURE IS SCHEDULED ON: 09/22/12               PLEASE REPORT TO SHORT STAY CENTER AT : 6:30 AM               CALL THIS NUMBER IF ANY PROBLEMS THE DAY OF SURGERY :               [phone]                      REMEMBER:   Do not eat food or drink liquids AFTER MIDNIGHT    Take these medicines the morning of surgery with A SIP OF WATER: CYMBALTA / REGLAN / [name] / XANAX   Do not wear jewelry, make-up   Do not wear lotions, powders, or perfumes.   Do not shave legs or underarms 12 hrs. before surgery (men may shave face)  Do not bring valuables to the hospital.  Contacts, dentures or bridgework may not be worn into surgery.  Leave suitcase in the car. After surgery it may be brought to your room.  For patients admitted to the hospital more than one night, checkout time is 11:00                          The day of discharge.   Patients discharged the day of surgery will not be allowed to drive home                             If going home same day of surgery, must have someone stay with you first                           24 hrs at home and arrange for some one to drive you home from hospital.    Special Instructions:   Please read over the following fact sheets that you were given:               1. MRSA  INFORMATION                      2. [place] PREPARING FOR SURGERY SHEET               3. INCENTIVE SPIROMETER                                                X_____________________________________________________________________        Failure to follow these instructions may result in cancellation of your surgery

## 2012-09-21 ENCOUNTER — Other Ambulatory Visit: Payer: Self-pay | Admitting: Orthopedic Surgery

## 2012-09-21 NOTE — H&P (Signed)
Kristi Avila  DOB: 08/01/1955 Single / Language: English / Race: White Female  Date of Admission:  09/22/2012  Chief Complaint:  Right Knee Pain  History of Present Illness The patient is a 57 year old female who comes in for a preoperative History and Physical. The patient is scheduled for a right total knee arthroplasty to be performed by Dr. Frank V. Aluisio, MD at [facility] on 09/22/2012. The patient is a 57 year old female who presents today for follow up of their knee. The patient is being followed for their right knee pain and osteoarthritis. They are now several month(s) out from a Synvisc series. Symptoms reported today include: pain, swelling and stiffness. The patient feels that they are doing poorly and report their pain level to be moderate to severe (worse with weightbearing). Current treatment includes: NSAIDs (Mobic). The following medication has been used for pain control: Hydrocodone. The patient has reported improvement of their symptoms with: viscosupplementation (for short periods). The knee is getting progressively worse. She is having some lateral hip pain but the knee pain is worse than that. This is limiting what she can and can not do. She is ready to get the knee replace. She is not having any swelling. She states it is not locking on her. She is ready to proceed with surgery. They have been treated conservatively in the past for the above stated problem and despite conservative measures, they continue to have progressive pain and severe functional limitations and dysfunction. They have failed non-operative management including home exercise, medications, and injections. It is felt that they would benefit from undergoing total joint replacement. Risks and benefits of the procedure have been discussed with the patient and they elect to proceed with surgery. There are no active contraindications to surgery such as ongoing infection or rapidly progressive  neurological disease.  Problem List Primary osteoarthritis of one knee (715.16)   Allergies Sulfanomides Cephalosporins Codeine/Codeine Derivatives   Family History Cerebrovascular Accident. mother Osteoporosis. mother and father Osteoarthritis. mother and father   Social History Number of flights of stairs before winded. 2-3 Illicit drug use. no Living situation. live alone Exercise. Exercises rarely; does running / walking Tobacco use. never smoker Pain Contract. no Children. 0 Current work status. working full time Alcohol use. current drinker; drinks beer, wine and hard liquor; only occasionally per week Drug/Alcohol Rehab (Currently). no Drug/Alcohol Rehab (Previously). no Marital status. single Tobacco / smoke exposure. no Advance Directives. Living Will, Healthcare POA   Medication History Zetia (10MG Tablet, Oral) Active. Metoclopramide HCl (10MG Tablet, Oral) Active. Zegerid ( Oral) Specific dose unknown - Active. Cymbalta (60MG Capsule DR Part, Oral) Active. Fluticasone Propionate (50MCG/ACT Suspension, Nasal) Active. Meloxicam (7.5MG Tablet, 1 (one) Tablet Oral two times daily with food  BEGIN ON DAY 7 AFTER DOSEPAK, Taken starting 11/26/2011) Active. (PAB/JAF RX SENT VIA ESCRIBE) Norco (10-325MG Tablet, 1-2 Tablet Oral every 4-6 hours as needed for pain, Taken starting 08/26/2012) Active.   Past Surgical History Anal Fissure Repair Sinus Surgery Arthroscopy of Knee. bilateral Carpal Tunnel Repair. right Colon Polyp Removal - Colonoscopy Dilation and Curettage of Uterus - Multiple Foot Surgery. left   Medical History Depression Hypercholesterolemia Skin Cancer Ulcer disease Osteoarthritis Gastroesophageal Reflux Disease Barrett's Esophagus Hemorrhoids Menopause   Review of Systems General:Present- Night Sweats and Fatigue. Not Present- Chills, Weight Gain, Weight Loss and Memory Loss. Skin:Not Present-  Hives, Itching, Rash, Eczema and Lesions. HEENT:Not Present- Tinnitus, Headache, Double Vision, Visual Loss, Hearing Loss and Dentures.   Respiratory:Not Present- Shortness of breath with exertion, Shortness of breath at rest, Allergies, Coughing up blood and Chronic Cough. Cardiovascular:Not Present- Chest Pain, Racing/skipping heartbeats, Difficulty Breathing Lying Down, Murmur, Swelling and Palpitations. Gastrointestinal:Present- Constipation. Not Present- Bloody Stool, Heartburn, Abdominal Pain, Vomiting, Nausea, Diarrhea, Difficulty Swallowing, Jaundice and Loss of appetitie. Female Genitourinary:Present- Urinary frequency. Not Present- Blood in Urine, Weak urinary stream, Discharge, Flank Pain, Incontinence, Painful Urination, Urgency, Urinary Retention and Urinating at Night. Musculoskeletal:Present- Muscle Weakness, Joint Pain, Back Pain and Morning Stiffness. Not Present- Muscle Pain, Joint Swelling and Spasms. Neurological:Not Present- Tremor, Dizziness, Blackout spells, Paralysis, Difficulty with balance and Weakness. Psychiatric:Not Present- Insomnia.   Vitals Pulse: 76 (Regular) Resp.: 14 (Unlabored) BP: 114/76 (Sitting, Left Arm, Standard)    Physical Exam The physical exam findings are as follows:   General Mental Status - Alert, cooperative and good historian. General Appearance- pleasant. Not in acute distress. Orientation- Oriented X3. Build & Nutrition- Well nourished and Well developed.   Head and Neck Head- normocephalic, atraumatic . Neck Global Assessment- supple. no bruit auscultated on the right and no bruit auscultated on the left.   Eye Pupil- Bilateral- Regular and Round. Motion- Bilateral- EOMI.   Chest and Lung Exam Auscultation: Breath sounds:- clear at anterior chest wall and - clear at posterior chest wall. Adventitious sounds:- No Adventitious sounds.   Cardiovascular Auscultation:Rhythm- Regular rate  and rhythm. Heart Sounds- S1 WNL and S2 WNL. Murmurs & Other Heart Sounds:Auscultation of the heart reveals - No Murmurs.   Abdomen Inspection:Contour- Generalized mild distention. Palpation/Percussion:Tenderness- Abdomen is non-tender to palpation. Rigidity (guarding)- Abdomen is soft. Auscultation:Auscultation of the abdomen reveals - Bowel sounds normal.   Female Genitourinary Not done, not pertinent to present illness  Musculoskeletal On exam she is alert and oriented in no apparent distress. Her right hip can be flexed to 110, rotated in 30, out 40, and abducted 40 with some discomfort on range of motion. She is very tender over the greater trochanter and palpation there reproduces her pain.  Assessment & Plan Primary osteoarthritis of one knee (715.16) Impression: Right Knee  Note: Plan is for a Right Total Knee Replacement by Dr. Aluisio.  Plan is to go home.  PCP - Mary Beth Dixon, PA-C [facility]  The patient does not have any contraindications and will recieve TXA (tranexamic acid) prior to surgery.  Time spent ~ 35 minutes   Signed electronically by Robt Okuda L Rayen Dafoe, III PA-C 

## 2012-09-22 ENCOUNTER — Other Ambulatory Visit: Payer: Self-pay | Admitting: Surgical

## 2012-09-22 ENCOUNTER — Inpatient Hospital Stay (HOSPITAL_COMMUNITY): Payer: BC Managed Care – PPO | Admitting: Registered Nurse

## 2012-09-22 ENCOUNTER — Encounter (HOSPITAL_COMMUNITY)
Admission: RE | Disposition: A | Discharge status: Home Health | Payer: Self-pay | Source: Ambulatory Visit | Attending: Orthopedic Surgery

## 2012-09-22 ENCOUNTER — Encounter (HOSPITAL_COMMUNITY): Payer: Self-pay | Admitting: Registered Nurse

## 2012-09-22 ENCOUNTER — Encounter (HOSPITAL_COMMUNITY): Payer: Self-pay | Admitting: *Deleted

## 2012-09-22 ENCOUNTER — Inpatient Hospital Stay (HOSPITAL_COMMUNITY)
Admission: RE | Admit: 2012-09-22 | Discharge: 2012-09-24 | DRG: 209 | Disposition: A | Discharge status: Home Health | Payer: BC Managed Care – PPO | Source: Ambulatory Visit | Attending: Orthopedic Surgery | Admitting: Orthopedic Surgery

## 2012-09-22 DIAGNOSIS — E669 Obesity, unspecified: Secondary | ICD-10-CM | POA: Diagnosis present

## 2012-09-22 DIAGNOSIS — M1710 Unilateral primary osteoarthritis, unspecified knee: Principal | ICD-10-CM | POA: Diagnosis present

## 2012-09-22 DIAGNOSIS — F329 Major depressive disorder, single episode, unspecified: Secondary | ICD-10-CM | POA: Diagnosis present

## 2012-09-22 DIAGNOSIS — F3289 Other specified depressive episodes: Secondary | ICD-10-CM | POA: Diagnosis present

## 2012-09-22 DIAGNOSIS — Z6835 Body mass index (BMI) 35.0-35.9, adult: Secondary | ICD-10-CM

## 2012-09-22 DIAGNOSIS — D62 Acute posthemorrhagic anemia: Secondary | ICD-10-CM

## 2012-09-22 DIAGNOSIS — E7800 Pure hypercholesterolemia, unspecified: Secondary | ICD-10-CM | POA: Diagnosis present

## 2012-09-22 DIAGNOSIS — Z79899 Other long term (current) drug therapy: Secondary | ICD-10-CM

## 2012-09-22 DIAGNOSIS — M179 Osteoarthritis of knee, unspecified: Secondary | ICD-10-CM

## 2012-09-22 DIAGNOSIS — K219 Gastro-esophageal reflux disease without esophagitis: Secondary | ICD-10-CM | POA: Diagnosis present

## 2012-09-22 DIAGNOSIS — M1990 Unspecified osteoarthritis, unspecified site: Secondary | ICD-10-CM | POA: Diagnosis present

## 2012-09-22 DIAGNOSIS — Z96651 Presence of right artificial knee joint: Secondary | ICD-10-CM

## 2012-09-22 HISTORY — PX: TOTAL KNEE ARTHROPLASTY: SHX125

## 2012-09-22 LAB — TYPE AND SCREEN
ABO/RH(D): AB POS
Antibody Screen: NEGATIVE

## 2012-09-22 SURGERY — ARTHROPLASTY, KNEE, TOTAL
Anesthesia: Spinal | Site: Knee | Laterality: Right | Wound class: Clean

## 2012-09-22 MED ORDER — TRAMADOL HCL 50 MG PO TABS
50.0000 mg | ORAL_TABLET | Freq: Four times a day (QID) | ORAL | Status: DC | PRN
  Administered 2012-09-23 (×3): 100 mg via ORAL
  Filled 2012-09-22 (×3): qty 2

## 2012-09-22 MED ORDER — VANCOMYCIN HCL IN DEXTROSE 1-5 GM/200ML-% IV SOLN
1000.0000 mg | Freq: Two times a day (BID) | INTRAVENOUS | Status: AC
  Administered 2012-09-22: 1000 mg via INTRAVENOUS
  Filled 2012-09-22: qty 200

## 2012-09-22 MED ORDER — POLYETHYLENE GLYCOL 3350 17 G PO PACK
17.0000 g | PACK | Freq: Every day | ORAL | Status: DC | PRN
  Administered 2012-09-23: 17 g via ORAL

## 2012-09-22 MED ORDER — MIDAZOLAM HCL 5 MG/5ML IJ SOLN
INTRAMUSCULAR | Status: DC | PRN
  Administered 2012-09-22: 2 mg via INTRAVENOUS

## 2012-09-22 MED ORDER — METOCLOPRAMIDE HCL 5 MG PO TABS
5.0000 mg | ORAL_TABLET | Freq: Four times a day (QID) | ORAL | Status: DC
  Administered 2012-09-22 – 2012-09-24 (×8): 5 mg via ORAL
  Filled 2012-09-22 (×11): qty 1

## 2012-09-22 MED ORDER — METHOCARBAMOL 500 MG PO TABS
500.0000 mg | ORAL_TABLET | Freq: Four times a day (QID) | ORAL | Status: DC | PRN
  Administered 2012-09-22 – 2012-09-24 (×7): 500 mg via ORAL
  Filled 2012-09-22 (×7): qty 1

## 2012-09-22 MED ORDER — LACTATED RINGERS IV SOLN: INTRAVENOUS | Status: DC

## 2012-09-22 MED ORDER — ALPRAZOLAM 0.25 MG PO TABS
0.2500 mg | ORAL_TABLET | Freq: Three times a day (TID) | ORAL | Status: DC | PRN
  Administered 2012-09-22 – 2012-09-23 (×4): 0.25 mg via ORAL
  Filled 2012-09-22 (×4): qty 1

## 2012-09-22 MED ORDER — METOCLOPRAMIDE HCL 5 MG/ML IJ SOLN: 5.0000 mg | Freq: Three times a day (TID) | INTRAMUSCULAR | Status: DC | PRN

## 2012-09-22 MED ORDER — STERILE WATER FOR IRRIGATION IR SOLN
Status: DC | PRN
  Administered 2012-09-22: 2000 mL

## 2012-09-22 MED ORDER — DEXAMETHASONE SODIUM PHOSPHATE 10 MG/ML IJ SOLN
INTRAMUSCULAR | Status: DC | PRN
  Administered 2012-09-22: 10 mg via INTRAVENOUS

## 2012-09-22 MED ORDER — PHENOL 1.4 % MT LIQD: 1.0000 | OROMUCOSAL | Status: DC | PRN

## 2012-09-22 MED ORDER — CHLORHEXIDINE GLUCONATE 4 % EX LIQD: 60.0000 mL | Freq: Once | CUTANEOUS | Status: DC

## 2012-09-22 MED ORDER — ACYCLOVIR 400 MG PO TABS
400.0000 mg | ORAL_TABLET | Freq: Every day | ORAL | Status: DC
  Administered 2012-09-22 – 2012-09-24 (×3): 400 mg via ORAL
  Filled 2012-09-22 (×3): qty 1

## 2012-09-22 MED ORDER — HYDROMORPHONE HCL PF 1 MG/ML IJ SOLN
0.5000 mg | INTRAMUSCULAR | Status: DC | PRN
  Administered 2012-09-22: 1 mg via INTRAVENOUS
  Administered 2012-09-22: 0.5 mg via INTRAVENOUS
  Administered 2012-09-22 – 2012-09-23 (×5): 1 mg via INTRAVENOUS
  Filled 2012-09-22 (×7): qty 1

## 2012-09-22 MED ORDER — RIVAROXABAN 10 MG PO TABS
10.0000 mg | ORAL_TABLET | Freq: Every day | ORAL | Status: DC
  Administered 2012-09-23 – 2012-09-24 (×2): 10 mg via ORAL
  Filled 2012-09-22 (×3): qty 1

## 2012-09-22 MED ORDER — PROPOFOL INFUSION 10 MG/ML OPTIME
INTRAVENOUS | Status: DC | PRN
  Administered 2012-09-22: 100 ug/kg/min via INTRAVENOUS

## 2012-09-22 MED ORDER — FLEET ENEMA 7-19 GM/118ML RE ENEM: 1.0000 | ENEMA | Freq: Once | RECTAL | Status: AC | PRN

## 2012-09-22 MED ORDER — MORPHINE SULFATE 2 MG/ML IJ SOLN
1.0000 mg | INTRAMUSCULAR | Status: DC | PRN
  Administered 2012-09-22: 2 mg via INTRAVENOUS
  Administered 2012-09-22: 1 mg via INTRAVENOUS
  Filled 2012-09-22 (×2): qty 1

## 2012-09-22 MED ORDER — MENTHOL 3 MG MT LOZG: 1.0000 | LOZENGE | OROMUCOSAL | Status: DC | PRN

## 2012-09-22 MED ORDER — PROMETHAZINE HCL 25 MG/ML IJ SOLN: 6.2500 mg | INTRAMUSCULAR | Status: DC | PRN

## 2012-09-22 MED ORDER — PANTOPRAZOLE SODIUM 40 MG PO TBEC
80.0000 mg | DELAYED_RELEASE_TABLET | Freq: Every day | ORAL | Status: DC
  Administered 2012-09-22 – 2012-09-24 (×3): 80 mg via ORAL
  Filled 2012-09-22 (×3): qty 2

## 2012-09-22 MED ORDER — SODIUM CHLORIDE 0.9 % IJ SOLN
INTRAMUSCULAR | Status: DC | PRN
  Administered 2012-09-22: 10:00:00

## 2012-09-22 MED ORDER — DEXAMETHASONE 6 MG PO TABS
10.0000 mg | ORAL_TABLET | Freq: Every day | ORAL | Status: AC
  Administered 2012-09-23: 10 mg via ORAL
  Filled 2012-09-22: qty 1

## 2012-09-22 MED ORDER — POTASSIUM CHLORIDE IN NACL 20-0.9 MEQ/L-% IV SOLN
INTRAVENOUS | Status: DC
  Administered 2012-09-22 (×2): via INTRAVENOUS
  Filled 2012-09-22 (×4): qty 1000

## 2012-09-22 MED ORDER — TRANEXAMIC ACID 100 MG/ML IV SOLN
1000.0000 mg | INTRAVENOUS | Status: AC
  Administered 2012-09-22: 1000 mg via INTRAVENOUS
  Filled 2012-09-22: qty 10

## 2012-09-22 MED ORDER — PROPOFOL 10 MG/ML IV BOLUS
INTRAVENOUS | Status: DC | PRN
  Administered 2012-09-22 (×2): 30 mg via INTRAVENOUS

## 2012-09-22 MED ORDER — FLUTICASONE PROPIONATE 50 MCG/ACT NA SUSP
2.0000 | Freq: Every day | NASAL | Status: DC
  Administered 2012-09-23: 2 via NASAL
  Filled 2012-09-22: qty 16

## 2012-09-22 MED ORDER — SODIUM CHLORIDE 0.9 % IR SOLN
Status: DC | PRN
  Administered 2012-09-22: 1000 mL

## 2012-09-22 MED ORDER — ONDANSETRON HCL 4 MG PO TABS: 4.0000 mg | ORAL_TABLET | Freq: Four times a day (QID) | ORAL | Status: DC | PRN

## 2012-09-22 MED ORDER — EZETIMIBE 10 MG PO TABS
10.0000 mg | ORAL_TABLET | Freq: Every evening | ORAL | Status: DC
  Administered 2012-09-22 – 2012-09-23 (×2): 10 mg via ORAL
  Filled 2012-09-22 (×3): qty 1

## 2012-09-22 MED ORDER — FESOTERODINE FUMARATE ER 8 MG PO TB24
8.0000 mg | ORAL_TABLET | Freq: Every evening | ORAL | Status: DC
  Administered 2012-09-22 – 2012-09-23 (×2): 8 mg via ORAL
  Filled 2012-09-22 (×3): qty 1

## 2012-09-22 MED ORDER — ACETAMINOPHEN 500 MG PO TABS
1000.0000 mg | ORAL_TABLET | Freq: Four times a day (QID) | ORAL | Status: AC
Start: 2012-09-22 — End: 2012-09-22
  Administered 2012-09-22: 1000 mg via ORAL
  Filled 2012-09-22: qty 2

## 2012-09-22 MED ORDER — LACTATED RINGERS IV SOLN
INTRAVENOUS | Status: DC
  Administered 2012-09-22 (×2): via INTRAVENOUS
  Administered 2012-09-22: 1000 mL via INTRAVENOUS

## 2012-09-22 MED ORDER — DOCUSATE SODIUM 100 MG PO CAPS
100.0000 mg | ORAL_CAPSULE | Freq: Two times a day (BID) | ORAL | Status: DC
  Administered 2012-09-22 – 2012-09-24 (×4): 100 mg via ORAL

## 2012-09-22 MED ORDER — MEPERIDINE HCL 50 MG/ML IJ SOLN
6.2500 mg | INTRAMUSCULAR | Status: DC | PRN
  Administered 2012-09-22: 12.5 mg via INTRAVENOUS

## 2012-09-22 MED ORDER — OXYCODONE HCL 5 MG PO TABS
5.0000 mg | ORAL_TABLET | ORAL | Status: DC | PRN
  Administered 2012-09-22 – 2012-09-24 (×17): 10 mg via ORAL
  Filled 2012-09-22 (×18): qty 2

## 2012-09-22 MED ORDER — DULOXETINE HCL 60 MG PO CPEP
60.0000 mg | ORAL_CAPSULE | Freq: Every morning | ORAL | Status: DC
Start: 2012-09-23 — End: 2012-09-24
  Administered 2012-09-23 – 2012-09-24 (×2): 60 mg via ORAL
  Filled 2012-09-22 (×2): qty 1

## 2012-09-22 MED ORDER — 0.9 % SODIUM CHLORIDE (POUR BTL) OPTIME
TOPICAL | Status: DC | PRN
  Administered 2012-09-22: 1000 mL

## 2012-09-22 MED ORDER — METOCLOPRAMIDE HCL 10 MG PO TABS: 5.0000 mg | ORAL_TABLET | Freq: Three times a day (TID) | ORAL | Status: DC | PRN

## 2012-09-22 MED ORDER — BUPIVACAINE HCL (PF) 0.5 % IJ SOLN
INTRAMUSCULAR | Status: AC
  Filled 2012-09-22: qty 30

## 2012-09-22 MED ORDER — BISACODYL 10 MG RE SUPP: 10.0000 mg | Freq: Every day | RECTAL | Status: DC | PRN

## 2012-09-22 MED ORDER — BUPIVACAINE HCL (PF) 0.25 % IJ SOLN
INTRAMUSCULAR | Status: AC
  Filled 2012-09-22: qty 30

## 2012-09-22 MED ORDER — BUPIVACAINE LIPOSOME 1.3 % IJ SUSP
20.0000 mL | Freq: Once | INTRAMUSCULAR | Status: DC
  Filled 2012-09-22: qty 20

## 2012-09-22 MED ORDER — HYDROMORPHONE HCL PF 1 MG/ML IJ SOLN: 0.2500 mg | INTRAMUSCULAR | Status: DC | PRN

## 2012-09-22 MED ORDER — METHOCARBAMOL 100 MG/ML IJ SOLN
500.0000 mg | Freq: Four times a day (QID) | INTRAVENOUS | Status: DC | PRN
  Administered 2012-09-22: 500 mg via INTRAVENOUS
  Filled 2012-09-22: qty 5

## 2012-09-22 MED ORDER — KETOROLAC TROMETHAMINE 15 MG/ML IJ SOLN
15.0000 mg | Freq: Four times a day (QID) | INTRAMUSCULAR | Status: AC | PRN
  Administered 2012-09-23: 15 mg via INTRAVENOUS
  Filled 2012-09-22: qty 1

## 2012-09-22 MED ORDER — SODIUM CHLORIDE 0.9 % IJ SOLN
INTRAMUSCULAR | Status: AC
  Filled 2012-09-22: qty 50

## 2012-09-22 MED ORDER — ONDANSETRON HCL 4 MG/2ML IJ SOLN
INTRAMUSCULAR | Status: DC | PRN
  Administered 2012-09-22: 4 mg via INTRAVENOUS

## 2012-09-22 MED ORDER — PHENYLEPHRINE HCL 10 MG/ML IJ SOLN
INTRAMUSCULAR | Status: DC | PRN
  Administered 2012-09-22 (×3): 120 ug via INTRAVENOUS
  Administered 2012-09-22 (×2): 80 ug via INTRAVENOUS
  Administered 2012-09-22: 40 ug via INTRAVENOUS

## 2012-09-22 MED ORDER — BUPIVACAINE IN DEXTROSE 0.75-8.25 % IT SOLN
INTRATHECAL | Status: DC | PRN
  Administered 2012-09-22: 1.6 mL via INTRATHECAL

## 2012-09-22 MED ORDER — DEXAMETHASONE SODIUM PHOSPHATE 10 MG/ML IJ SOLN: 10.0000 mg | Freq: Once | INTRAMUSCULAR | Status: DC

## 2012-09-22 MED ORDER — DEXAMETHASONE SODIUM PHOSPHATE 10 MG/ML IJ SOLN
10.0000 mg | Freq: Every day | INTRAMUSCULAR | Status: AC
  Filled 2012-09-22: qty 1

## 2012-09-22 MED ORDER — ACETAMINOPHEN 500 MG PO TABS
1000.0000 mg | ORAL_TABLET | Freq: Once | ORAL | Status: AC
  Administered 2012-09-22: 1000 mg via ORAL
  Filled 2012-09-22: qty 2

## 2012-09-22 MED ORDER — SODIUM CHLORIDE 0.9 % IV SOLN: INTRAVENOUS | Status: DC

## 2012-09-22 MED ORDER — FENTANYL CITRATE 0.05 MG/ML IJ SOLN
INTRAMUSCULAR | Status: DC | PRN
  Administered 2012-09-22: 100 ug via INTRAVENOUS

## 2012-09-22 MED ORDER — ONDANSETRON HCL 4 MG/2ML IJ SOLN: 4.0000 mg | Freq: Four times a day (QID) | INTRAMUSCULAR | Status: DC | PRN

## 2012-09-22 MED ORDER — BUPIVACAINE HCL 0.25 % IJ SOLN
INTRAMUSCULAR | Status: DC | PRN
  Administered 2012-09-22: 20 mL

## 2012-09-22 MED ORDER — MEPERIDINE HCL 50 MG/ML IJ SOLN
INTRAMUSCULAR | Status: AC
  Filled 2012-09-22: qty 1

## 2012-09-22 MED ORDER — DIPHENHYDRAMINE HCL 12.5 MG/5ML PO ELIX
12.5000 mg | ORAL_SOLUTION | ORAL | Status: DC | PRN
Start: 2012-09-22 — End: 2012-09-24

## 2012-09-22 MED ORDER — VANCOMYCIN HCL 10 G IV SOLR
1500.0000 mg | INTRAVENOUS | Status: AC
  Administered 2012-09-22: 1500 mg via INTRAVENOUS
  Filled 2012-09-22: qty 1500

## 2012-09-22 SURGICAL SUPPLY — 58 items
BAG SPEC THK2 15X12 ZIP CLS (MISCELLANEOUS) ×1
BAG ZIPLOCK 12X15 (MISCELLANEOUS) ×2 IMPLANT
BANDAGE ELASTIC 6 VELCRO ST LF (GAUZE/BANDAGES/DRESSINGS) ×2 IMPLANT
BANDAGE ESMARK 6X9 LF (GAUZE/BANDAGES/DRESSINGS) ×1 IMPLANT
BLADE SAG 18X100X1.27 (BLADE) ×2 IMPLANT
BLADE SAW SGTL 11.0X1.19X90.0M (BLADE) ×2 IMPLANT
BNDG CMPR 9X6 STRL LF SNTH (GAUZE/BANDAGES/DRESSINGS) ×1
BNDG ESMARK 6X9 LF (GAUZE/BANDAGES/DRESSINGS) ×2
BOWL SMART MIX CTS (DISPOSABLE) ×2 IMPLANT
CAPT RP KNEE ×1 IMPLANT
CEMENT HV SMART SET (Cement) ×4 IMPLANT
CLOTH BEACON ORANGE TIMEOUT ST (SAFETY) ×2 IMPLANT
CUFF TOURN SGL QUICK 34 (TOURNIQUET CUFF) ×2
CUFF TRNQT CYL 34X4X40X1 (TOURNIQUET CUFF) ×1 IMPLANT
DECANTER SPIKE VIAL GLASS SM (MISCELLANEOUS) ×2 IMPLANT
DRAPE EXTREMITY T 121X128X90 (DRAPE) ×2 IMPLANT
DRAPE POUCH INSTRU U-SHP 10X18 (DRAPES) ×2 IMPLANT
DRAPE U-SHAPE 47X51 STRL (DRAPES) ×2 IMPLANT
DRSG ADAPTIC 3X8 NADH LF (GAUZE/BANDAGES/DRESSINGS) ×2 IMPLANT
DRSG PAD ABDOMINAL 8X10 ST (GAUZE/BANDAGES/DRESSINGS) ×2 IMPLANT
DURAPREP 26ML APPLICATOR (WOUND CARE) ×2 IMPLANT
ELECT REM PT RETURN 9FT ADLT (ELECTROSURGICAL) ×2
ELECTRODE REM PT RTRN 9FT ADLT (ELECTROSURGICAL) ×1 IMPLANT
EVACUATOR 1/8 PVC DRAIN (DRAIN) ×2 IMPLANT
FACESHIELD LNG OPTICON STERILE (SAFETY) ×10 IMPLANT
GLOVE BIO SURGEON STRL SZ7.5 (GLOVE) IMPLANT
GLOVE BIO SURGEON STRL SZ8 (GLOVE) ×2 IMPLANT
GLOVE BIOGEL PI IND STRL 8 (GLOVE) ×2 IMPLANT
GLOVE BIOGEL PI INDICATOR 8 (GLOVE) ×2
GLOVE SURG SS PI 6.5 STRL IVOR (GLOVE) IMPLANT
GOWN STRL NON-REIN LRG LVL3 (GOWN DISPOSABLE) ×2 IMPLANT
GOWN STRL REIN XL XLG (GOWN DISPOSABLE) IMPLANT
HANDPIECE INTERPULSE COAX TIP (DISPOSABLE) ×2
IMMOBILIZER KNEE 20 (SOFTGOODS) ×2
IMMOBILIZER KNEE 20 THIGH 36 (SOFTGOODS) ×1 IMPLANT
KIT BASIN OR (CUSTOM PROCEDURE TRAY) ×2 IMPLANT
MANIFOLD NEPTUNE II (INSTRUMENTS) ×2 IMPLANT
NDL SAFETY ECLIPSE 18X1.5 (NEEDLE) ×2 IMPLANT
NEEDLE HYPO 18GX1.5 SHARP (NEEDLE) ×4
NS IRRIG 1000ML POUR BTL (IV SOLUTION) ×2 IMPLANT
PACK TOTAL JOINT (CUSTOM PROCEDURE TRAY) ×2 IMPLANT
PADDING CAST COTTON 6X4 STRL (CAST SUPPLIES) ×5 IMPLANT
POSITIONER SURGICAL ARM (MISCELLANEOUS) ×2 IMPLANT
SET HNDPC FAN SPRY TIP SCT (DISPOSABLE) ×1 IMPLANT
SPONGE GAUZE 4X4 12PLY (GAUZE/BANDAGES/DRESSINGS) ×2 IMPLANT
STRIP CLOSURE SKIN 1/2X4 (GAUZE/BANDAGES/DRESSINGS) ×4 IMPLANT
STRIP CLOSURE SKIN 1/4X4 (GAUZE/BANDAGES/DRESSINGS) ×1 IMPLANT
SUCTION FRAZIER 12FR DISP (SUCTIONS) ×2 IMPLANT
SUT MNCRL AB 4-0 PS2 18 (SUTURE) ×2 IMPLANT
SUT VIC AB 2-0 CT1 27 (SUTURE) ×6
SUT VIC AB 2-0 CT1 TAPERPNT 27 (SUTURE) ×3 IMPLANT
SUT VLOC 180 0 24IN GS25 (SUTURE) ×2 IMPLANT
SYR 20CC LL (SYRINGE) ×2 IMPLANT
SYR 50ML LL SCALE MARK (SYRINGE) ×2 IMPLANT
TOWEL OR 17X26 10 PK STRL BLUE (TOWEL DISPOSABLE) ×4 IMPLANT
TRAY FOLEY CATH 14FRSI W/METER (CATHETERS) ×2 IMPLANT
WATER STERILE IRR 1500ML POUR (IV SOLUTION) ×2 IMPLANT
WRAP KNEE MAXI GEL POST OP (GAUZE/BANDAGES/DRESSINGS) ×2 IMPLANT

## 2012-09-22 NOTE — Addendum Note (Signed)
Addendum created 09/22/12 1458 by Illene Silver, CRNA   Modules edited: Anesthesia LDA

## 2012-09-22 NOTE — Interval H&P Note (Signed)
History and Physical Interval Note:  09/22/2012 7:06 AM  Kristi Avila  has presented today for surgery, with the diagnosis of oa right knee   The various methods of treatment have been discussed with the patient and family. After consideration of risks, benefits and other options for treatment, the patient has consented to  Procedure(s): RIGHT TOTAL KNEE ARTHROPLASTY (Right) as a surgical intervention .  The patient's history has been reviewed, patient examined, no change in status, stable for surgery.  I have reviewed the patient's chart and labs.  Questions were answered to the patient's satisfaction.     Loanne Drilling

## 2012-09-22 NOTE — Progress Notes (Signed)
Utilization review completed.  

## 2012-09-22 NOTE — Evaluation (Signed)
Physical Therapy Evaluation Patient Details Name: Kristi Avila MRN: [id number] DOB: 1956/01/07 Today's Date: 09/22/2012 Time: 4098-1191 PT Time Calculation (min): 20 min  PT Assessment / Plan / Recommendation History of Present Illness  s/p R TKA 09/22/12  Clinical Impression  On eval POD 0, pt required Min assist for mobility-able to ambulate ~25 feet with RW. Anticipate pt will progress well during stay. Recommend HHPT, RW.     PT Assessment  Patient needs continued PT services    Follow Up Recommendations  Home health PT    Does the patient have the potential to tolerate intense rehabilitation      Barriers to Discharge        Equipment Recommendations  Rolling walker with 5" wheels    Recommendations for Other Services OT consult   Frequency 7X/week    Precautions / Restrictions Precautions Precautions: Knee;Fall Required Braces or Orthoses: Knee Immobilizer - Right Knee Immobilizer - Right: Discontinue once straight leg raise with < 10 degree lag Restrictions Weight Bearing Restrictions: No RLE Weight Bearing: Weight bearing as tolerated   Pertinent Vitals/Pain 5/10 R knee. Ice applied end of session      Mobility  Bed Mobility Bed Mobility: Supine to Sit Supine to Sit: 4: Min assist Details for Bed Mobility Assistance: Assist for R LE off bed.  Transfers Transfers: Sit to Stand;Stand to Sit Sit to Stand: 4: Min assist;From bed;From elevated surface Stand to Sit: 4: Min assist;To chair/3-in-1;With armrests Details for Transfer Assistance: VCs safety, technique, handplacement. Assist to rise, stabilize, control descent Ambulation/Gait Ambulation/Gait Assistance: 4: Min assist Ambulation Distance (Feet): 25 Feet Assistive device: Rolling walker Ambulation/Gait Assistance Details: VCS safety, technique, sequence, step length. Assist to stabilize.  Gait Pattern: Step-to pattern;Decreased stride length;Antalgic    Exercises     PT Diagnosis: Difficulty  walking;Abnormality of gait;Acute pain  PT Problem List: Decreased strength;Decreased range of motion;Decreased activity tolerance;Decreased balance;Decreased mobility;Pain;Decreased knowledge of use of DME PT Treatment Interventions: DME instruction;Gait training;Stair training;Functional mobility training;Therapeutic activities;Therapeutic exercise;Patient/family education     PT Goals(Current goals can be found in the care plan section) Acute Rehab PT Goals Patient Stated Goal: home. regain independence PT Goal Formulation: With patient Time For Goal Achievement: 10/06/12 Potential to Achieve Goals: Good  Visit Information  Last PT Received On: 09/22/12 Assistance Needed: +1 History of Present Illness: s/p R TKA 09/22/12       Prior Functioning  Home Living Family/patient expects to be discharged to:: Private residence Living Arrangements: Spouse/significant other Available Help at Discharge: Family;Friend(s) Type of Home: House Home Access: Stairs to enter [employer] of Steps: 2 Entrance Stairs-Rails: None Home Layout: Two level;Able to live on main level with bedroom/bathroom Home Equipment: None Prior Function Level of Independence: Independent Communication Communication: No difficulties    Cognition  Cognition Arousal/Alertness: Awake/alert Behavior During Therapy: WFL for tasks assessed/performed Overall Cognitive Status: Within Functional Limits for tasks assessed    Extremity/Trunk Assessment Upper Extremity Assessment Upper Extremity Assessment: Defer to OT evaluation Lower Extremity Assessment Lower Extremity Assessment: RLE deficits/detail RLE Deficits / Details: hip flex 2/5, moves ankle well Cervical / Trunk Assessment Cervical / Trunk Assessment: Normal   Balance    End of Session PT - End of Session Equipment Utilized During Treatment: Gait belt Activity Tolerance: Patient tolerated treatment well Patient left: in chair;with call  bell/phone within reach CPM Right Knee CPM Right Knee: Off  GP     Rebeca Alert, MPT Pager: [phone]

## 2012-09-22 NOTE — Addendum Note (Signed)
Addendum created 09/22/12 1300 by Illene Silver, CRNA   Modules edited: Anesthesia Blocks and Procedures

## 2012-09-22 NOTE — Transfer of Care (Signed)
Immediate Anesthesia Transfer of Care Note  Patient: Kristi Avila  Procedure(s) Performed: Procedure(s): RIGHT TOTAL KNEE ARTHROPLASTY (Right)  Patient Location: PACU  Anesthesia Type:Spinal  Level of Consciousness: awake, alert  and oriented  Airway & Oxygen Therapy: Patient Spontanous Breathing and Patient connected to face mask oxygen  Post-op Assessment: Post -op Vital signs reviewed and stable  Post vital signs: stable  Complications: No apparent anesthesia complications

## 2012-09-22 NOTE — Anesthesia Preprocedure Evaluation (Signed)
Anesthesia Evaluation  Patient identified by MRN, date of birth, ID band Patient awake    Reviewed: Allergy & Precautions, H&P , NPO status , Patient's Chart, lab work & pertinent test results  History of Anesthesia Complications (+) PONV  Airway Mallampati: II TM Distance: >3 FB Neck ROM: Full    Dental no notable dental hx.    Pulmonary neg pulmonary ROS,  breath sounds clear to auscultation  Pulmonary exam normal       Cardiovascular negative cardio ROS  Rhythm:Regular Rate:Normal     Neuro/Psych negative neurological ROS  negative psych ROS   GI/Hepatic negative GI ROS, Neg liver ROS,   Endo/Other  negative endocrine ROS  Renal/GU negative Renal ROS  negative genitourinary   Musculoskeletal negative musculoskeletal ROS (+)   Abdominal   Peds negative pediatric ROS (+)  Hematology negative hematology ROS (+)   Anesthesia Other Findings   Reproductive/Obstetrics negative OB ROS                           Anesthesia Physical Anesthesia Plan  ASA: II  Anesthesia Plan: Spinal   Post-op Pain Management:    Induction:   Airway Management Planned: Simple Face Mask  Additional Equipment:   Intra-op Plan:   Post-operative Plan:   Informed Consent: I have reviewed the patients History and Physical, chart, labs and discussed the procedure including the risks, benefits and alternatives for the proposed anesthesia with the patient or authorized representative who has indicated his/her understanding and acceptance.   Dental advisory given  Plan Discussed with: CRNA  Anesthesia Plan Comments:         Anesthesia Quick Evaluation  

## 2012-09-22 NOTE — H&P (View-Only) (Signed)
Kristi Avila  DOB: 10/16/1955 Single / Language: [place] / Race: White Female  Date of Admission:  09/22/2012  Chief Complaint:  Right Knee Pain  History of Present Illness The patient is a 57 year old female who comes in for a preoperative History and Physical. The patient is scheduled for a right total knee arthroplasty to be performed by Dr. Gus Rankin. Aluisio, MD at [facility] on 09/22/2012. The patient is a 57 year old female who presents today for follow up of their knee. The patient is being followed for their right knee pain and osteoarthritis. They are now several month(s) out from a Synvisc series. Symptoms reported today include: pain, swelling and stiffness. The patient feels that they are doing poorly and report their pain level to be moderate to severe (worse with weightbearing). Current treatment includes: NSAIDs (Mobic). The following medication has been used for pain control: Hydrocodone. The patient has reported improvement of their symptoms with: viscosupplementation (for short periods). The knee is getting progressively worse. She is having some lateral hip pain but the knee pain is worse than that. This is limiting what she can and can not do. She is ready to get the knee replace. She is not having any swelling. She states it is not locking on her. She is ready to proceed with surgery. They have been treated conservatively in the past for the above stated problem and despite conservative measures, they continue to have progressive pain and severe functional limitations and dysfunction. They have failed non-operative management including home exercise, medications, and injections. It is felt that they would benefit from undergoing total joint replacement. Risks and benefits of the procedure have been discussed with the patient and they elect to proceed with surgery. There are no active contraindications to surgery such as ongoing infection or rapidly progressive  neurological disease.  Problem List Primary osteoarthritis of one knee (715.16)   Allergies Sulfanomides Cephalosporins Codeine/Codeine Derivatives   Family History Cerebrovascular Accident. mother Osteoporosis. mother and father Osteoarthritis. mother and father   Social History Number of flights of stairs before winded. 2-3 Illicit drug use. no Living situation. live alone Exercise. Exercises rarely; does running / walking Tobacco use. never smoker Pain Contract. no Children. 0 Current work status. working full time Alcohol use. current drinker; drinks beer, wine and hard liquor; only occasionally per week Drug/Alcohol Rehab (Currently). no Drug/Alcohol Rehab (Previously). no Marital status. single Tobacco / smoke exposure. no Advance Directives. Living Will, Healthcare POA   Medication History Zetia (10MG  Tablet, Oral) Active. Metoclopramide HCl (10MG  Tablet, Oral) Active. Zegerid ( Oral) Specific dose unknown - Active. Cymbalta (60MG  Capsule DR Part, Oral) Active. Fluticasone Propionate (50MCG/ACT Suspension, Nasal) Active. Meloxicam (7.5MG  Tablet, 1 (one) Tablet Oral two times daily with food  BEGIN ON DAY 7 AFTER DOSEPAK, Taken starting 11/26/2011) Active. (PAB/JAF RX SENT VIA ESCRIBE) Norco (10-325MG  Tablet, 1-2 Tablet Oral every 4-6 hours as needed for pain, Taken starting 08/26/2012) Active.   Past Surgical History Anal Fissure Repair Sinus Surgery Arthroscopy of Knee. bilateral Carpal Tunnel Repair. right Colon Polyp Removal - Colonoscopy Dilation and Curettage of Uterus - Multiple Foot Surgery. left   Medical History Depression Hypercholesterolemia Skin Cancer Ulcer disease Osteoarthritis Gastroesophageal Reflux Disease Barrett's Esophagus Hemorrhoids Menopause   Review of Systems General:Present- Night Sweats and Fatigue. Not Present- Chills, Weight Gain, Weight Loss and Memory Loss. Skin:Not Present-  Hives, Itching, Rash, Eczema and Lesions. HEENT:Not Present- Tinnitus, Headache, Double Vision, Visual Loss, Hearing Loss and Dentures.  Respiratory:Not Present- Shortness of breath with exertion, Shortness of breath at rest, Allergies, Coughing up blood and Chronic Cough. Cardiovascular:Not Present- Chest Pain, Racing/skipping heartbeats, Difficulty Breathing Lying Down, Murmur, Swelling and Palpitations. Gastrointestinal:Present- Constipation. Not Present- Bloody Stool, Heartburn, Abdominal Pain, Vomiting, Nausea, Diarrhea, Difficulty Swallowing, Jaundice and Loss of appetitie. Female Genitourinary:Present- Urinary frequency. Not Present- Blood in Urine, Weak urinary stream, Discharge, Flank Pain, Incontinence, Painful Urination, Urgency, Urinary Retention and Urinating at Night. Musculoskeletal:Present- Muscle Weakness, Joint Pain, Back Pain and Morning Stiffness. Not Present- Muscle Pain, Joint Swelling and Spasms. Neurological:Not Present- Tremor, Dizziness, Blackout spells, Paralysis, Difficulty with balance and Weakness. Psychiatric:Not Present- Insomnia.   Vitals Pulse: 76 (Regular) Resp.: 14 (Unlabored) BP: 114/76 (Sitting, Left Arm, Standard)    Physical Exam The physical exam findings are as follows:   General Mental Status - Alert, cooperative and good historian. General Appearance- pleasant. Not in acute distress. Orientation- Oriented X3. Build & Nutrition- Well nourished and Well developed.   Head and Neck Head- normocephalic, atraumatic . Neck Global Assessment- supple. no bruit auscultated on the right and no bruit auscultated on the left.   Eye Pupil- Bilateral- Regular and Round. Motion- Bilateral- EOMI.   Chest and Lung Exam Auscultation: Breath sounds:- clear at anterior chest wall and - clear at posterior chest wall. Adventitious sounds:- No Adventitious sounds.   Cardiovascular Auscultation:Rhythm- Regular rate  and rhythm. Heart Sounds- S1 WNL and S2 WNL. Murmurs & Other Heart Sounds:Auscultation of the heart reveals - No Murmurs.   Abdomen Inspection:Contour- Generalized mild distention. Palpation/Percussion:Tenderness- Abdomen is non-tender to palpation. Rigidity (guarding)- Abdomen is soft. Auscultation:Auscultation of the abdomen reveals - Bowel sounds normal.   Female Genitourinary Not done, not pertinent to present illness  Musculoskeletal On exam she is alert and oriented in no apparent distress. Her right hip can be flexed to 110, rotated in 30, out 40, and abducted 40 with some discomfort on range of motion. She is very tender over the greater trochanter and palpation there reproduces her pain.  Assessment & Plan Primary osteoarthritis of one knee (715.16) Impression: Right Knee  Note: Plan is for a Right Total Knee Replacement by Dr. Lequita Halt.  Plan is to go home.  PCP - Frazier Richards, PA-C [facility]  The patient does not have any contraindications and will recieve TXA (tranexamic acid) prior to surgery.  Time spent ~ 35 minutes   Signed electronically by Lauraine Rinne, III PA-C

## 2012-09-22 NOTE — Op Note (Signed)
Pre-operative diagnosis- Osteoarthritis  Right knee(s)  Post-operative diagnosis- Osteoarthritis Right knee(s)  Procedure-  Right  Total Knee Arthroplasty  Surgeon- Gus Rankin. Konica Stankowski, MD  Assistant- Dimitri Ped, PA-C   Anesthesia-  Spinal EBL-* No blood loss amount entered *  Drains Hemovac  Tourniquet time-  Total Tourniquet Time Documented: Thigh (Right) - 42 minutes Total: Thigh (Right) - 42 minutes    Complications- None  Condition-PACU - hemodynamically stable.   Brief Clinical Note  Kristi Avila is a 57 y.o. year old female with end stage OA of her right knee with progressively worsening pain and dysfunction. She has constant pain, with activity and at rest and significant functional deficits with difficulties even with ADLs. She has had extensive non-op management including analgesics, injections of cortisone and viscosupplements, and home exercise program, but remains in significant pain with significant dysfunction.Radiographs show bone on bone arthritis lateral and patellofemoral. She presents now for right Total Knee Arthroplasty.    Procedure in detail---   The patient is brought into the operating room and positioned supine on the operating table. After successful administration of  Spinal,   a tourniquet is placed high on the  Right thigh(s) and the lower extremity is prepped and draped in the usual sterile fashion. Time out is performed by the operating team and then the  Right lower extremity is wrapped in Esmarch, knee flexed and the tourniquet inflated to 300 mmHg.       A midline incision is made with a ten blade through the subcutaneous tissue to the level of the extensor mechanism. A fresh blade is used to make a medial parapatellar arthrotomy. Soft tissue over the proximal medial tibia is subperiosteally elevated to the joint line with a knife and into the semimembranosus bursa with a Cobb elevator. Soft tissue over the proximal lateral tibia is elevated with  attention being paid to avoiding the patellar tendon on the tibial tubercle. The patella is everted, knee flexed 90 degrees and the ACL and PCL are removed. Findings are bone on bone lateral and patellofemoral with large lateral osteophytes.        The drill is used to create a starting hole in the distal femur and the canal is thoroughly irrigated with sterile saline to remove the fatty contents. The 5 degree Right  valgus alignment guide is placed into the femoral canal and the distal femoral cutting block is pinned to remove 10 mm off the distal femur. Resection is made with an oscillating saw.      The tibia is subluxed forward and the menisci are removed. The extramedullary alignment guide is placed referencing proximally at the medial aspect of the tibial tubercle and distally along the second metatarsal axis and tibial crest. The block is pinned to remove 2mm off the more deficient lateral  side. Resection is made with an oscillating saw. Size 3is the most appropriate size for the tibia and the proximal tibia is prepared with the modular drill and keel punch for that size.      The femoral sizing guide is placed and size 4 narrow is most appropriate. Rotation is marked off the epicondylar axis and confirmed by creating a rectangular flexion gap at 90 degrees. The size 4 cutting block is pinned in this rotation and the anterior, posterior and chamfer cuts are made with the oscillating saw. The intercondylar block is then placed and that cut is made.      Trial size 3 tibial component, trial size 4  narrow posterior stabilized femur and a 10  mm posterior stabilized rotating platform insert trial is placed. Full extension is achieved with excellent varus/valgus and anterior/posterior balance throughout full range of motion. The patella is everted and thickness measured to be 22  mm. Free hand resection is taken to 12 mm, a 38 template is placed, lug holes are drilled, trial patella is placed, and it tracks  normally. Osteophytes are removed off the posterior femur with the trial in place. All trials are removed and the cut bone surfaces prepared with pulsatile lavage. Cement is mixed and once ready for implantation, the size 3 tibial implant, size  4 narrow posterior stabilized femoral component, and the size 38 patella are cemented in place and the patella is held with the clamp. The trial insert is placed and the knee held in full extension. The Exparel (20 ml mixed with 30 ml saline) and .25% Bupivicaine, are injected into the extensor mechanism, posterior capsule, medial and lateral gutters and subcutaneous tissues.  All extruded cement is removed and once the cement is hard the permanent 10 mm posterior stabilized rotating platform insert is placed into the tibial tray.      The wound is copiously irrigated with saline solution and the extensor mechanism closed over a hemovac drain with #1 V-loc suture. The tourniquet is released for a total tourniquet time of 42  minutes. Flexion against gravity is 140 degrees and the patella tracks normally. Subcutaneous tissue is closed with 2.0 vicryl and subcuticular with running 4.0 Monocryl. The incision is cleaned and dried and steri-strips and a bulky sterile dressing are applied. The limb is placed into a knee immobilizer and the patient is awakened and transported to recovery in stable condition.      Please note that a surgical assistant was a medical necessity for this procedure in order to perform it in a safe and expeditious manner. Surgical assistant was necessary to retract the ligaments and vital neurovascular structures to prevent injury to them and also necessary for proper positioning of the limb to allow for anatomic placement of the prosthesis.   Gus Rankin Kaysin Brock, MD    09/22/2012, 10:39 AM

## 2012-09-22 NOTE — Transfer of Care (Signed)
Immediate Anesthesia Transfer of Care Note  Patient: Kristi Avila  Procedure(s) Performed: Procedure(s): RIGHT TOTAL KNEE ARTHROPLASTY (Right)  Patient Location: PACU  Anesthesia Type:Spinal  Level of Consciousness: awake, alert  and oriented  Airway & Oxygen Therapy: Patient Spontanous Breathing and Patient connected to face mask oxygen  Post-op Assessment: Report given to PACU RN  Post vital signs: stable  Complications: No apparent anesthesia complications

## 2012-09-22 NOTE — Anesthesia Procedure Notes (Addendum)
Spinal   Spinal  End time: 09/22/2012 9:35 AM Staffing CRNA/Resident: Lorianna Spadaccini E Performed by: resident/CRNA  Preanesthetic Checklist Completed: patient identified, site marked, surgical consent, pre-op evaluation, timeout performed, IV checked, risks and benefits discussed and monitors and equipment checked Spinal Block Patient position: sitting Prep: Betadine Patient monitoring: heart rate, continuous pulse ox and blood pressure Approach: midline Location: L2-3 Injection technique: single-shot Needle Needle type: Sprotte  Needle gauge: 24 G Assessment Sensory level: T4 Additional Notes Spinal kit expiration date checked and confirmed . Pt tolerated procedure well CSFx3, negative heme, negative paresthesia.

## 2012-09-22 NOTE — Anesthesia Postprocedure Evaluation (Signed)
  Anesthesia Post-op Note  Patient: Kristi Avila  Procedure(s) Performed: Procedure(s) (LRB): RIGHT TOTAL KNEE ARTHROPLASTY (Right)  Patient Location: PACU  Anesthesia Type: Spinal  Level of Consciousness: awake and alert   Airway and Oxygen Therapy: Patient Spontanous Breathing  Post-op Pain: mild  Post-op Assessment: Post-op Vital signs reviewed, Patient's Cardiovascular Status Stable, Respiratory Function Stable, Patent Airway and No signs of Nausea or vomiting  Last Vitals:  Filed Vitals:   09/22/12 1214  BP: 128/76  Pulse: 61  Temp: 36.5 C  Resp:     Post-op Vital Signs: stable   Complications: No apparent anesthesia complications

## 2012-09-23 ENCOUNTER — Encounter (HOSPITAL_COMMUNITY): Payer: Self-pay | Admitting: Orthopedic Surgery

## 2012-09-23 DIAGNOSIS — D62 Acute posthemorrhagic anemia: Secondary | ICD-10-CM

## 2012-09-23 LAB — BASIC METABOLIC PANEL
BUN: 8 mg/dL (ref 6–23)
Chloride: 105 mEq/L (ref 96–112)
GFR calc Af Amer: >90 mL/min (ref >90)
GFR calc non Af Amer: >90 mL/min (ref >90)
Glucose, Bld: 104 mg/dL — ABNORMAL HIGH (ref 70–99)
Potassium: 3.9 mEq/L (ref 3.5–5.1)
Sodium: 137 mEq/L (ref 135–145)

## 2012-09-23 LAB — CBC
HCT: 32.9 % — ABNORMAL LOW (ref 36.0–46.0)
Hemoglobin: 10.8 g/dL — ABNORMAL LOW (ref 12.0–15.0)
MCHC: 32.8 g/dL (ref 30.0–36.0)
WBC: 12.3 10*3/uL — ABNORMAL HIGH (ref 4.0–10.5)

## 2012-09-23 NOTE — Evaluation (Signed)
Occupational Therapy Evaluation Patient Details Name: Kristi Avila MRN: [id number] DOB: 02-06-1955 Today's Date: 09/23/2012 Time: 4098-1191 OT Time Calculation (min): 35 min  OT Assessment / Plan / Recommendation History of present illness s/p R TKA 09/22/12   Clinical Impression   Pt is doing well and able to practice toilet transfer with walker use today. Will benefit from skilled OT services to maximize ADL independence for d/c home with family assist.     OT Assessment  Patient needs continued OT Services    Follow Up Recommendations  No OT follow up;Supervision/Assistance - 24 hour    Barriers to Discharge      Equipment Recommendations  3 in 1 bedside comode    Recommendations for Other Services    Frequency  Min 2X/week    Precautions / Restrictions Precautions Precautions: Knee;Fall Required Braces or Orthoses: Knee Immobilizer - Right Knee Immobilizer - Right: Discontinue once straight leg raise with < 10 degree lag Restrictions Weight Bearing Restrictions: No RLE Weight Bearing: Weight bearing as tolerated   Pertinent Vitals/Pain 4/10 R knee. Reposition, ice    ADL  Eating/Feeding: Independent Where Assessed - Eating/Feeding: Chair Grooming: Wash/dry hands;Set up Where Assessed - Grooming: Supported sitting Upper Body Bathing: Chest;Right arm;Left arm;Abdomen;Set up Where Assessed - Upper Body Bathing: Unsupported sitting Lower Body Bathing: Minimal assistance Where Assessed - Lower Body Bathing: Supported sit to stand Upper Body Dressing: Set up Where Assessed - Upper Body Dressing: Unsupported sitting Lower Body Dressing: Moderate assistance Where Assessed - Lower Body Dressing: Supported sit to Pharmacist, hospital: Minimal Web designer: Raised toilet seat with arms (or 3-in-1 over toilet) Toileting - Clothing Manipulation and Hygiene: Minimal assistance Where Assessed - Engineer, mining and Hygiene: Sit to stand  from 3-in-1 or toilet Equipment Used: Rolling walker ADL Comments: Pt's significant other present and will be able to help at d/c. Pt may be able to borrow a 3in1 but is leaning toward obtaining one here. She plans to sponge bathe initially.     OT Diagnosis: Generalized weakness  OT Problem List: Decreased strength;Decreased knowledge of use of DME or AE OT Treatment Interventions: Self-care/ADL training;DME and/or AE instruction;Patient/family education;Therapeutic activities   OT Goals(Current goals can be found in the care plan section) Acute Rehab OT Goals Patient Stated Goal: home. regain independence OT Goal Formulation: With patient/family Time For Goal Achievement: 09/30/12 Potential to Achieve Goals: Good  Visit Information  Last OT Received On: 09/23/12 Assistance Needed: +1 History of Present Illness: s/p R TKA 09/22/12       Prior Functioning     Home Living Family/patient expects to be discharged to:: Private residence Living Arrangements: Spouse/significant other Available Help at Discharge: Family;Friend(s) Type of Home: House Home Access: Stairs to enter [employer] of Steps: 2 Entrance Stairs-Rails: None Home Layout: Two level;Able to live on main level with bedroom/bathroom Home Equipment: None Additional Comments: states she may be able to borrow a 3in1 Prior Function Level of Independence: Independent Communication Communication: No difficulties         Vision/Perception     Cognition  Cognition Arousal/Alertness: Awake/alert Behavior During Therapy: WFL for tasks assessed/performed Overall Cognitive Status: Within Functional Limits for tasks assessed    Extremity/Trunk Assessment Upper Extremity Assessment Upper Extremity Assessment: Overall WFL for tasks assessed     Mobility Bed Mobility Supine to Sit: 4: Min guard;HOB elevated Transfers Transfers: Sit to Stand;Stand to Sit Sit to Stand: 4: Min guard;With upper extremity  assist;From bed;From chair/3-in-1  Stand to Sit: 4: Min assist;With upper extremity assist;To chair/3-in-1 Details for Transfer Assistance: verbal cues for hand placement and safety     Exercise     Balance Balance Balance Assessed: Yes Dynamic Standing Balance Dynamic Standing - Level of Assistance: 4: Min assist (min guard)   End of Session OT - End of Session Equipment Utilized During Treatment: Gait belt Activity Tolerance: Patient tolerated treatment well Patient left: in chair;with call bell/phone within reach;with family/visitor present CPM Right Knee CPM Right Knee: Off  GO     Lennox Laity [phone] 09/23/2012, 9:58 AM

## 2012-09-23 NOTE — Progress Notes (Signed)
   Subjective: 1 Day Post-Op Procedure(s) (LRB): RIGHT TOTAL KNEE ARTHROPLASTY (Right) Patient reports pain as moderate and severe last night.  Little better this morning. Patient seen in rounds with Dr. Lequita Halt. Patient is well, but has had some minor complaints of pain in the knee, requiring pain medications We will start therapy today.  Plan is to go Home after hospital stay.  Objective: Vital signs in last 24 hours: Temp:  [96.9 F (36.1 C)-98.5 F (36.9 C)] 98.1 F (36.7 C) (09/09 0518) Pulse Rate:  [58-91] 79 (09/09 0518) Resp:  [13-19] 16 (09/09 0518) BP: (99-128)/(49-81) 126/81 mmHg (09/09 0518) SpO2:  [94 %-100 %] 100 % (09/09 0518) Weight:  [102.059 kg (225 lb)] 102.059 kg (225 lb) (09/08 1214)  Intake/Output from previous day:  Intake/Output Summary (Last 24 hours) at 09/23/12 0722 Last data filed at 09/23/12 0519  Gross per 24 hour  Intake 4057.5 ml  Output   6280 ml  Net -2222.5 ml    Intake/Output this shift:    Labs:  Recent Labs  09/23/12 0403  HGB 10.8*    Recent Labs  09/23/12 0403  WBC 12.3*  RBC 3.60*  HCT 32.9*  PLT 296    Recent Labs  09/23/12 0403  NA 137  K 3.9  CL 105  CO2 27  BUN 8  CREATININE 0.78  GLUCOSE 104*  CALCIUM 9.0   No results found for this basename: LABPT, INR,  in the last 72 hours  EXAM General - Patient is Alert, Appropriate and Oriented Extremity - Neurovascular intact Sensation intact distally Dorsiflexion/Plantar flexion intact Dressing - dressing C/D/I Motor Function - intact, moving foot and toes well on exam.  Hemovac pulled without difficulty.  Past Medical History  Diagnosis Date  . Reflux   . High cholesterol   . Overactive bladder   . Obesity   . Anxiety   . Depression   . Arthritis   . HSV-2 infection     anal  . GERD (gastroesophageal reflux disease)     h/o peptic ulcer  . PONV (postoperative nausea and vomiting)   . Frequency of urination   . Urgency of urination   .  Nocturia     Assessment/Plan: 1 Day Post-Op Procedure(s) (LRB): RIGHT TOTAL KNEE ARTHROPLASTY (Right) Principal Problem:   OA (osteoarthritis) of knee Active Problems:   Postoperative anemia due to acute blood loss  Estimated body mass index is 35.78 kg/(m^2) as calculated from the following:   Height as of this encounter: 5' 6.5" (1.689 m).   Weight as of this encounter: 102.059 kg (225 lb). Advance diet Up with therapy Discharge home with home health  DVT Prophylaxis - Xarelto Weight-Bearing as tolerated to right leg No vaccines. D/C O2 and Pulse OX and try on Room Air  Camie Hauss 09/23/2012, 7:22 AM

## 2012-09-23 NOTE — Progress Notes (Addendum)
[facility]  [facility] is providing the following services: Patient declined RW - stated that she already has access to one.   If patient discharges after hours, please call [phone].   Renard Hamper 09/23/2012, 11:59 AM

## 2012-09-23 NOTE — Progress Notes (Signed)
Physical Therapy Treatment Patient Details Name: Kristi Avila MRN: [id number] DOB: 11/16/1955 Today's Date: 09/23/2012 Time: 4098-1191 PT Time Calculation (min): 23 min  PT Assessment / Plan / Recommendation  History of Present Illness s/p R TKA 09/22/12   PT Comments   Progressing with mobility. Still having some pain control issues.   Follow Up Recommendations  Home health PT     Does the patient have the potential to tolerate intense rehabilitation     Barriers to Discharge        Equipment Recommendations  Rolling walker with 5" wheels    Recommendations for Other Services OT consult  Frequency 7X/week   Progress towards PT Goals Progress towards PT goals: Progressing toward goals  Plan Current plan remains appropriate    Precautions / Restrictions Precautions Precautions: Knee;Fall Required Braces or Orthoses: Knee Immobilizer - Right Knee Immobilizer - Right: Discontinue once straight leg raise with < 10 degree lag Restrictions Weight Bearing Restrictions: No RLE Weight Bearing: Weight bearing as tolerated   Pertinent Vitals/Pain 1/10 R knee at rest; 5/10 R knee with activity. Ice applied end of session    Mobility  Bed Mobility Bed Mobility: Supine to Sit;Sit to Supine Supine to Sit: 4: Min assist Sit to Supine: 4: Min assist Details for Bed Mobility Assistance: Assist for R LE off/onto bed.  Transfers Transfers: Sit to Stand;Stand to Sit Sit to Stand: 4: Min assist;From bed Stand to Sit: 4: Min guard;To bed Details for Transfer Assistance: verbal cues for hand placement and safety. Assist to rise, stabilize Ambulation/Gait Ambulation/Gait Assistance: 4: Min guard Ambulation Distance (Feet): 95 Feet Assistive device: Rolling walker Ambulation/Gait Assistance Details: VCs safety, technique, sequence, step length. Assist to stabilize intermittently. Gait Pattern: Step-to pattern;Antalgic;Trunk flexed    Exercises Total Joint Exercises Ankle Circles/Pumps:  AROM;Both;15 reps;Supine Quad Sets: AROM;Both;15 reps;Supine Heel Slides: AAROM;Right;10 reps;Supine Hip ABduction/ADduction: AAROM;Right;10 reps;Supine Straight Leg Raises: AAROM;Right;10 reps;Supine Goniometric ROM: 10-50 degrees supine   PT Diagnosis:    PT Problem List:   PT Treatment Interventions:     PT Goals (current goals can now be found in the care plan section)    Visit Information  Last PT Received On: 09/23/12 Assistance Needed: +1 History of Present Illness: s/p R TKA 09/22/12    Subjective Data      Cognition  Cognition Arousal/Alertness: Awake/alert Behavior During Therapy: WFL for tasks assessed/performed Overall Cognitive Status: Within Functional Limits for tasks assessed    Balance     End of Session PT - End of Session Equipment Utilized During Treatment: Gait belt Activity Tolerance: Patient tolerated treatment well Patient left: in bed;with call bell/phone within reach;with family/visitor present   GP     Rebeca Alert, MPT Pager: [phone]

## 2012-09-23 NOTE — Progress Notes (Signed)
Physical Therapy Treatment Patient Details Name: Kristi Avila MRN: [id number] DOB: 26-Sep-1955 Today's Date: 09/23/2012 Time: 1140-1205 PT Time Calculation (min): 25 min  PT Assessment / Plan / Recommendation  History of Present Illness s/p R TKA 09/22/12   PT Comments   Progressing with mobility. Moderate pain with activity.   Follow Up Recommendations  Home health PT     Does the patient have the potential to tolerate intense rehabilitation     Barriers to Discharge        Equipment Recommendations  Rolling walker with 5" wheels    Recommendations for Other Services OT consult  Frequency 7X/week   Progress towards PT Goals Progress towards PT goals: Progressing toward goals  Plan Current plan remains appropriate    Precautions / Restrictions Precautions Precautions: Knee;Fall Required Braces or Orthoses: Knee Immobilizer - Right Knee Immobilizer - Right: Discontinue once straight leg raise with < 10 degree lag Restrictions Weight Bearing Restrictions: No RLE Weight Bearing: Weight bearing as tolerated   Pertinent Vitals/Pain 7/10 R knee. Pre-medicated before session. Ice applied end of session    Mobility  Bed Mobility Bed Mobility: Supine to Sit;Sit to Supine Supine to Sit: 4: Min assist Sit to Supine: 4: Min assist Details for Bed Mobility Assistance: Assist for R LE off/onto bed.  Transfers Transfers: Sit to Stand;Stand to Sit Sit to Stand: 4: Min assist;From bed Stand to Sit: 4: Min assist;To bed Details for Transfer Assistance: verbal cues for hand placement and safety. Assist to rise, stabilize, control descent Ambulation/Gait Ambulation/Gait Assistance: 4: Min assist Ambulation Distance (Feet): 100 Feet Assistive device: Rolling walker Ambulation/Gait Assistance Details: VCs safety, technique, sequence, step length. Assist to stabilize intermittently. Gait Pattern: Step-to pattern;Decreased stride length;Antalgic;Trunk flexed    Exercises Total Joint  Exercises Ankle Circles/Pumps: AROM;Both;15 reps;Supine Quad Sets: AROM;Both;15 reps;Supine Heel Slides: AAROM;Right;10 reps;Supine Hip ABduction/ADduction: AAROM;Right;10 reps;Supine Straight Leg Raises: AAROM;Right;10 reps;Supine Goniometric ROM: 10-50 degrees supine   PT Diagnosis:    PT Problem List:   PT Treatment Interventions:     PT Goals (current goals can now be found in the care plan section) Acute Rehab PT Goals Patient Stated Goal: home. regain independence  Visit Information  Last PT Received On: 09/23/12 Assistance Needed: +1 History of Present Illness: s/p R TKA 09/22/12    Subjective Data  Patient Stated Goal: home. regain independence   Cognition  Cognition Arousal/Alertness: Awake/alert Behavior During Therapy: WFL for tasks assessed/performed Overall Cognitive Status: Within Functional Limits for tasks assessed    Balance  Balance Balance Assessed: Yes Dynamic Standing Balance Dynamic Standing - Level of Assistance: 4: Min assist (min guard)  End of Session PT - End of Session Equipment Utilized During Treatment: Gait belt Activity Tolerance: Patient tolerated treatment well Patient left: in bed;with call bell/phone within reach CPM Right Knee CPM Right Knee: Off   GP     Rebeca Alert, MPT Pager: [phone]

## 2012-09-24 LAB — CBC
HCT: 32.5 % — ABNORMAL LOW (ref 36.0–46.0)
Hemoglobin: 10.4 g/dL — ABNORMAL LOW (ref 12.0–15.0)
MCH: 29.5 pg (ref 26.0–34.0)
MCHC: 32 g/dL (ref 30.0–36.0)
MCV: 92.3 fL (ref 78.0–100.0)
RDW: 12.9 % (ref 11.5–15.5)

## 2012-09-24 LAB — BASIC METABOLIC PANEL
BUN: 11 mg/dL (ref 6–23)
Creatinine, Ser: 0.78 mg/dL (ref 0.50–1.10)
GFR calc Af Amer: >90 mL/min (ref >90)
GFR calc non Af Amer: >90 mL/min (ref >90)
Glucose, Bld: 97 mg/dL (ref 70–99)
Potassium: 3.7 mEq/L (ref 3.5–5.1)

## 2012-09-24 MED ORDER — METHOCARBAMOL 500 MG PO TABS: 500.0000 mg | ORAL_TABLET | Freq: Four times a day (QID) | ORAL | Status: DC | PRN

## 2012-09-24 MED ORDER — RIVAROXABAN 10 MG PO TABS: 10.0000 mg | ORAL_TABLET | Freq: Every day | ORAL | Status: DC

## 2012-09-24 MED ORDER — TRAMADOL HCL 50 MG PO TABS: 50.0000 mg | ORAL_TABLET | Freq: Four times a day (QID) | ORAL | Status: DC | PRN

## 2012-09-24 MED ORDER — OXYCODONE HCL 5 MG PO TABS: 5.0000 mg | ORAL_TABLET | ORAL | Status: DC | PRN

## 2012-09-24 NOTE — Discharge Summary (Signed)
Physician Discharge Summary   Patient ID: Kristi Avila MRN: [id number] DOB/AGE: 03-09-1955 57 y.o.  Admit date: 09/22/2012 Discharge date: 09/24/2012  Primary Diagnosis:  Osteoarthritis Right knee(s)  Admission Diagnoses:  Past Medical History  Diagnosis Date  . Reflux   . High cholesterol   . Overactive bladder   . Obesity   . Anxiety   . Depression   . Arthritis   . HSV-2 infection     anal  . GERD (gastroesophageal reflux disease)     h/o peptic ulcer  . PONV (postoperative nausea and vomiting)   . Frequency of urination   . Urgency of urination   . Nocturia    Discharge Diagnoses:   Principal Problem:   OA (osteoarthritis) of knee Active Problems:   Postoperative anemia due to acute blood loss  Estimated body mass index is 35.78 kg/(m^2) as calculated from the following:   Height as of this encounter: 5' 6.5" (1.689 m).   Weight as of this encounter: 102.059 kg (225 lb).  Procedure:  Procedure(s) (LRB): RIGHT TOTAL KNEE ARTHROPLASTY (Right)   Consults: None  HPI: Kristi Avila is a 58 y.o. year old female with end stage OA of her right knee with progressively worsening pain and dysfunction. She has constant pain, with activity and at rest and significant functional deficits with difficulties even with ADLs. She has had extensive non-op management including analgesics, injections of cortisone and viscosupplements, and home exercise program, but remains in significant pain with significant dysfunction.Radiographs show bone on bone arthritis lateral and patellofemoral. She presents now for right Total Knee Arthroplasty.   Laboratory Data: Admission on 09/22/2012, Discharged on 09/24/2012  Component Date Value Range Status  . MRSA, PCR 09/17/2012 NEGATIVE  NEGATIVE Final  . Staphylococcus aureus 09/17/2012 POSITIVE* NEGATIVE Final   Comment:                                 The Xpert SA Assay (FDA                          approved for NASAL specimens              in patients over 70 years of age),                          is one component of                          a comprehensive surveillance                          program.  Test performance has                          been validated by [employer] for patients greater                          than or equal to 3 year old.                          It  is not intended                          to diagnose infection nor to                          guide or monitor treatment.  Marland Kitchen aPTT 09/17/2012 27  24 - 37 seconds Final  . WBC 09/17/2012 7.4  4.0 - 10.5 K/uL Final  . RBC 09/17/2012 4.40  3.87 - 5.11 MIL/uL Final  . Hemoglobin 09/17/2012 13.2  12.0 - 15.0 g/dL Final  . HCT 81/19/1478 40.1  36.0 - 46.0 % Final  . MCV 09/17/2012 91.1  78.0 - 100.0 fL Final  . MCH 09/17/2012 30.0  26.0 - 34.0 pg Final  . MCHC 09/17/2012 32.9  30.0 - 36.0 g/dL Final  . RDW 29/56/2130 12.5  11.5 - 15.5 % Final  . Platelets 09/17/2012 333  150 - 400 K/uL Final  . Sodium 09/17/2012 134* 135 - 145 mEq/L Final  . Potassium 09/17/2012 3.8  3.5 - 5.1 mEq/L Final  . Chloride 09/17/2012 100  96 - 112 mEq/L Final  . CO2 09/17/2012 28  19 - 32 mEq/L Final  . Glucose, Bld 09/17/2012 102* 70 - 99 mg/dL Final  . BUN 86/57/8469 13  6 - 23 mg/dL Final  . Creatinine, Ser 09/17/2012 0.91  0.50 - 1.10 mg/dL Final  . Calcium 62/95/2841 9.1  8.4 - 10.5 mg/dL Final  . Total Protein 09/17/2012 6.9  6.0 - 8.3 g/dL Final  . Albumin 32/44/0102 3.8  3.5 - 5.2 g/dL Final  . AST 72/53/6644 19  0 - 37 U/L Final  . ALT 09/17/2012 23  0 - 35 U/L Final  . Alkaline Phosphatase 09/17/2012 88  39 - 117 U/L Final  . Total Bilirubin 09/17/2012 0.3  0.3 - 1.2 mg/dL Final  . GFR calc non Af Amer 09/17/2012 70* >90 mL/min Final  . GFR calc Af Amer 09/17/2012 81* >90 mL/min Final   Comment: (NOTE)                          The eGFR has been calculated using the CKD EPI equation.                          This  calculation has not been validated in all clinical situations.                          eGFR's persistently <90 mL/min signify possible Chronic Kidney                          Disease.  Marland Kitchen Prothrombin Time 09/17/2012 12.5  11.6 - 15.2 seconds Final  . INR 09/17/2012 0.95  0.00 - 1.49 Final  . ABO/RH(D) 09/17/2012 AB POS   Final  . Antibody Screen 09/17/2012 NEG   Final  . Sample Expiration 09/17/2012 09/25/2012   Final  . Color, Urine 09/17/2012 YELLOW  YELLOW Final  . APPearance 09/17/2012 CLEAR  CLEAR Final  . Specific Gravity, Urine 09/17/2012 1.013  1.005 - 1.030 Final  . pH 09/17/2012 6.0  5.0 - 8.0 Final  . Glucose, UA 09/17/2012 NEGATIVE  NEGATIVE mg/dL Final  . Hgb urine dipstick 09/17/2012 NEGATIVE  NEGATIVE Final  . Bilirubin Urine 09/17/2012 NEGATIVE  NEGATIVE Final  . Ketones, ur 09/17/2012 NEGATIVE  NEGATIVE mg/dL Final  . Protein, ur 82/95/6213 NEGATIVE  NEGATIVE mg/dL Final  . Urobilinogen, UA 09/17/2012 0.2  0.0 - 1.0 mg/dL Final  . Nitrite 08/65/7846 NEGATIVE  NEGATIVE Final  . Leukocytes, UA 09/17/2012 NEGATIVE  NEGATIVE Final   MICROSCOPIC NOT DONE ON URINES WITH NEGATIVE PROTEIN, BLOOD, LEUKOCYTES, NITRITE, OR GLUCOSE <1000 mg/dL.  . WBC 09/23/2012 12.3* 4.0 - 10.5 K/uL Final  . RBC 09/23/2012 3.60* 3.87 - 5.11 MIL/uL Final  . Hemoglobin 09/23/2012 10.8* 12.0 - 15.0 g/dL Final  . HCT 96/29/5284 32.9* 36.0 - 46.0 % Final  . MCV 09/23/2012 91.4  78.0 - 100.0 fL Final  . MCH 09/23/2012 30.0  26.0 - 34.0 pg Final  . MCHC 09/23/2012 32.8  30.0 - 36.0 g/dL Final  . RDW 13/24/4010 12.5  11.5 - 15.5 % Final  . Platelets 09/23/2012 296  150 - 400 K/uL Final  . Sodium 09/23/2012 137  135 - 145 mEq/L Final  . Potassium 09/23/2012 3.9  3.5 - 5.1 mEq/L Final  . Chloride 09/23/2012 105  96 - 112 mEq/L Final  . CO2 09/23/2012 27  19 - 32 mEq/L Final  . Glucose, Bld 09/23/2012 104* 70 - 99 mg/dL Final  . BUN 27/25/3664 8  6 - 23 mg/dL Final  . Creatinine, Ser 09/23/2012 0.78   0.50 - 1.10 mg/dL Final  . Calcium 40/34/7425 9.0  8.4 - 10.5 mg/dL Final  . GFR calc non Af Amer 09/23/2012 >90  >90 mL/min Final  . GFR calc Af Amer 09/23/2012 >90  >90 mL/min Final   Comment: (NOTE)                          The eGFR has been calculated using the CKD EPI equation.                          This calculation has not been validated in all clinical situations.                          eGFR's persistently <90 mL/min signify possible Chronic Kidney                          Disease.  . WBC 09/24/2012 11.5* 4.0 - 10.5 K/uL Final  . RBC 09/24/2012 3.52* 3.87 - 5.11 MIL/uL Final  . Hemoglobin 09/24/2012 10.4* 12.0 - 15.0 g/dL Final  . HCT 95/63/8756 32.5* 36.0 - 46.0 % Final  . MCV 09/24/2012 92.3  78.0 - 100.0 fL Final  . MCH 09/24/2012 29.5  26.0 - 34.0 pg Final  . MCHC 09/24/2012 32.0  30.0 - 36.0 g/dL Final  . RDW 43/32/9518 12.9  11.5 - 15.5 % Final  . Platelets 09/24/2012 289  150 - 400 K/uL Final  . Sodium 09/24/2012 137  135 - 145 mEq/L Final  . Potassium 09/24/2012 3.7  3.5 - 5.1 mEq/L Final  . Chloride 09/24/2012 102  96 - 112 mEq/L Final  . CO2 09/24/2012 28  19 - 32 mEq/L Final  . Glucose, Bld 09/24/2012 97  70 - 99 mg/dL Final  . BUN 84/16/6063 11  6 - 23 mg/dL Final  . Creatinine, Ser 09/24/2012 0.78  0.50 - 1.10 mg/dL Final  . Calcium 01/60/1093 9.2  8.4 - 10.5 mg/dL Final  . GFR calc  non Af Amer 09/24/2012 >90  >90 mL/min Final  . GFR calc Af Amer 09/24/2012 >90  >90 mL/min Final   Comment: (NOTE)                          The eGFR has been calculated using the CKD EPI equation.                          This calculation has not been validated in all clinical situations.                          eGFR's persistently <90 mL/min signify possible Chronic Kidney                          Disease.  Hospital Outpatient Visit on 09/17/2012  Component Date Value Range Status  . ABO/RH(D) 09/17/2012 AB POS   Final  Abstract on 09/12/2012  Component Date Value Range  Status  . HM Mammogram 10/30/2011 [facility]  normal   Final  . HM Colonoscopy 06/29/2005 normal   Final  Appointment on 08/25/2012  Component Date Value Range Status  . Cholesterol 08/25/2012 306* 0 - 200 mg/dL Final   Comment: ATP III Classification:                                < 200        mg/dL        Desirable                               200 - 239     mg/dL        Borderline High                               >= 240        mg/dL        High                             . Triglycerides 08/25/2012 176* <150 mg/dL Final  . HDL 16/10/9602 71  >39 mg/dL Final  . Total CHOL/HDL Ratio 08/25/2012 4.3   Final  . VLDL 08/25/2012 35  0 - 40 mg/dL Final  . LDL Cholesterol 08/25/2012 200* 0 - 99 mg/dL Final   Comment:                            Total Cholesterol/HDL Ratio:CHD Risk                                                 Coronary Heart Disease Risk Table  Men       Women                                   1/2 Average Risk              3.4        3.3                                       Average Risk              5.0        4.4                                    2X Average Risk              9.6        7.1                                    3X Average Risk             23.4       11.0                          Use the calculated Patient Ratio above and the CHD Risk table                           to determine the patient's CHD Risk.                          ATP III Classification (LDL):                                < 100        mg/dL         Optimal                               100 - 129     mg/dL         Near or Above Optimal                               130 - 159     mg/dL         Borderline High                               160 - 189     mg/dL         High                                > 190        mg/dL         Very High                             .  Total Bilirubin 08/25/2012 0.4  0.3 - 1.2 mg/dL Final  . Bilirubin,  Direct 08/25/2012 0.1  0.0 - 0.3 mg/dL Final  . Indirect Bilirubin 08/25/2012 0.3  0.0 - 0.9 mg/dL Final  . Alkaline Phosphatase 08/25/2012 82  39 - 117 U/L Final  . AST 08/25/2012 15  0 - 37 U/L Final  . ALT 08/25/2012 27  0 - 35 U/L Final  . Total Protein 08/25/2012 7.5  6.0 - 8.3 g/dL Final  . Albumin 16/10/9602 4.6  3.5 - 5.2 g/dL Final     X-Rays:Us Venous Img Lower Unilateral Left  09/11/2012   *RADIOLOGY REPORT*  Clinical Data: Left calf pain.  LEFT LOWER EXTREMITY VENOUS DUPLEX ULTRASOUND  Technique:  Gray-scale sonography with graded compression, as well as color Doppler and duplex ultrasound were performed to evaluate the deep venous system of the lower extremity from the level of the common femoral vein through the popliteal and proximal calf veins. Spectral Doppler was utilized to evaluate flow at rest and with distal augmentation maneuvers.  Comparison:  None.  Findings:  Normal compressibility of the common femoral, superficial femoral, and popliteal veins is demonstrated, as well as the visualized proximal calf veins.  No filling defects to suggest DVT on grayscale or color Doppler imaging.  Doppler waveforms show normal direction of venous flow, normal respiratory phasicity and response to augmentation.  IMPRESSION: No evidence of lower extremity deep vein thrombosis.   Original Report Authenticated By: Holley Dexter, M.D.    EKG: Orders placed during the hospital encounter of 05/24/12  . ED EKG  . ED EKG  . EKG 12-LEAD  . EKG 12-LEAD     Hospital Course: Kristi Avila is a 57 y.o. who was admitted to [facility]. They were brought to the operating room on 09/22/2012 and underwent Procedure(s): RIGHT TOTAL KNEE ARTHROPLASTY.  Patient tolerated the procedure well and was later transferred to the recovery room and then to the orthopaedic floor for postoperative care.  They were given PO and IV analgesics for pain control following their surgery.  They were given 24 hours  of postoperative antibiotics of  Anti-infectives   Start     Dose/Rate Route Frequency Ordered Stop   09/22/12 2130  vancomycin (VANCOCIN) IVPB 1000 mg/200 mL premix     1,000 mg 200 mL/hr over 60 Minutes Intravenous Every 12 hours 09/22/12 1227 09/22/12 2308   09/22/12 1300  acyclovir (ZOVIRAX) tablet 400 mg  Status:  Discontinued     400 mg Oral Daily 09/22/12 1227 09/24/12 1630   09/22/12 0647  vancomycin (VANCOCIN) 1,500 mg in sodium chloride 0.9 % 500 mL IVPB     1,500 mg 250 mL/hr over 120 Minutes Intravenous On call to O.R. 09/22/12 5409 09/22/12 0920     and started on DVT prophylaxis in the form of Xarelto.   PT and OT were ordered for total joint protocol.  Discharge planning consulted to help with postop disposition and equipment needs.  Patient had a rough night on the evening of surgery with moderate to severe pain.  They started to get up OOB with therapy on day one. Hemovac drain was pulled without difficulty.  Continued to work with therapy into day two.  Dressing was changed on day two and the incision was healing well. Patient was seen in rounds and was ready to go home later that same day.   Discharge Medications: Prior to Admission medications   Medication Sig Start Date End Date Taking? Authorizing  Provider  acyclovir (ZOVIRAX) 400 MG tablet Take 400 mg by mouth daily.    Yes Historical Provider, MD  ALPRAZolam (XANAX) 0.25 MG tablet Take 0.25 mg by mouth 3 (three) times daily as needed for anxiety.   Yes Historical Provider, MD  docusate sodium (COLACE) 100 MG capsule Take 200 mg by mouth every morning.   Yes Historical Provider, MD  DULoxetine (CYMBALTA) 60 MG capsule Take 60 mg by mouth every morning.   Yes Historical Provider, MD  ezetimibe (ZETIA) 10 MG tablet Take 10 mg by mouth every evening.   Yes Historical Provider, MD  fesoterodine (TOVIAZ) 8 MG TB24 tablet Take 8 mg by mouth every evening.   Yes Historical Provider, MD  fluticasone (FLONASE) 50 MCG/ACT nasal  spray INSTILL 2 SPRAYS IN EACH NOSTRIL EVERY DAY 06/04/12  Yes Dorena Bodo, PA-C  metoCLOPramide (REGLAN) 5 MG tablet Take 5 mg by mouth 4 (four) times daily.   Yes Historical Provider, MD  omeprazole-sodium bicarbonate (ZEGERID) 40-1100 MG per capsule Take 1 capsule by mouth every evening.    Yes Historical Provider, MD  Lidocaine-Hydrocortisone Ace 3-2.5 % KIT Place rectally 2 (two) times daily as needed (only with flare ups).    Historical Provider, MD  methocarbamol (ROBAXIN) 500 MG tablet Take 1 tablet (500 mg total) by mouth every 6 (six) hours as needed. 09/24/12   Helena Sardo, PA-C  oxyCODONE (OXY IR/ROXICODONE) 5 MG immediate release tablet Take 1-2 tablets (5-10 mg total) by mouth every 3 (three) hours as needed. 09/24/12   Ayvah Caroll Julien Girt, PA-C  rivaroxaban (XARELTO) 10 MG TABS tablet Take 1 tablet (10 mg total) by mouth daily with breakfast. Take Xarelto for two and a half more weeks, then discontinue Xarelto. Once the patient has completed the blood thinner regimen, then take a Baby 81 mg Aspirin daily for four more weeks. 09/24/12   Eular Panek, PA-C  traMADol (ULTRAM) 50 MG tablet Take 1-2 tablets (50-100 mg total) by mouth every 6 (six) hours as needed (mild pain). 09/24/12   Zadia Uhde Julien Girt, PA-C    Diet: Cardiac diet Activity:WBAT Follow-up:in 2 weeks Disposition - Home Discharged Condition: good       Discharge Orders   Future Orders Complete By Expires   Call MD / Call 911  As directed    Comments:     If you experience chest pain or shortness of breath, CALL 911 and be transported to the hospital emergency room.  If you develope a fever above 101 F, pus (white drainage) or increased drainage or redness at the wound, or calf pain, call your surgeon's office.   Change dressing  As directed    Comments:     Change dressing daily with sterile 4 x 4 inch gauze dressing and apply TED hose. Do not submerge the incision under water.   Constipation  Prevention  As directed    Comments:     Drink plenty of fluids.  Prune juice may be helpful.  You may use a stool softener, such as Colace (over the counter) 100 mg twice a day.  Use MiraLax (over the counter) for constipation as needed.   Diet general  As directed    Discharge instructions  As directed    Comments:     Pick up stool softner and laxative for home. Do not submerge incision under water. May shower. Continue to use ice for pain and swelling from surgery.  Take Xarelto for two and a half more weeks, then discontinue  Xarelto. Once the patient has completed the blood thinner regimen, then take a Baby 81 mg Aspirin daily for four more weeks.   Do not put a pillow under the knee. Place it under the heel.  As directed    Do not sit on low chairs, stoools or toilet seats, as it may be difficult to get up from low surfaces  As directed    Driving restrictions  As directed    Comments:     No driving until released by the physician.   Increase activity slowly as tolerated  As directed    Lifting restrictions  As directed    Comments:     No lifting until released by the physician.   Patient may shower  As directed    Comments:     You may shower without a dressing once there is no drainage.  Do not wash over the wound.  If drainage remains, do not shower until drainage stops.   TED hose  As directed    Comments:     Use stockings (TED hose) for 3 weeks on both leg(s).  You may remove them at night for sleeping.   Weight bearing as tolerated  As directed        Medication List    STOP taking these medications       BLACK COHOSH PO     cholecalciferol 1000 UNITS tablet  Commonly known as:  VITAMIN D     meloxicam 15 MG tablet  Commonly known as:  MOBIC     multivitamin tablet      TAKE these medications       acyclovir 400 MG tablet  Commonly known as:  ZOVIRAX  Take 400 mg by mouth daily.     ALPRAZolam 0.25 MG tablet  Commonly known as:  XANAX  Take 0.25  mg by mouth 3 (three) times daily as needed for anxiety.     docusate sodium 100 MG capsule  Commonly known as:  COLACE  Take 200 mg by mouth every morning.     DULoxetine 60 MG capsule  Commonly known as:  CYMBALTA  Take 60 mg by mouth every morning.     ezetimibe 10 MG tablet  Commonly known as:  ZETIA  Take 10 mg by mouth every evening.     fluticasone 50 MCG/ACT nasal spray  Commonly known as:  FLONASE  INSTILL 2 SPRAYS IN EACH NOSTRIL EVERY DAY     Lidocaine-Hydrocortisone Ace 3-2.5 % Kit  Place rectally 2 (two) times daily as needed (only with flare ups).     methocarbamol 500 MG tablet  Commonly known as:  ROBAXIN  Take 1 tablet (500 mg total) by mouth every 6 (six) hours as needed.     metoCLOPramide 5 MG tablet  Commonly known as:  REGLAN  Take 5 mg by mouth 4 (four) times daily.     omeprazole-sodium bicarbonate 40-1100 MG per capsule  Commonly known as:  ZEGERID  Take 1 capsule by mouth every evening.     oxyCODONE 5 MG immediate release tablet  Commonly known as:  Oxy IR/ROXICODONE  Take 1-2 tablets (5-10 mg total) by mouth every 3 (three) hours as needed.     rivaroxaban 10 MG Tabs tablet  Commonly known as:  XARELTO  - Take 1 tablet (10 mg total) by mouth daily with breakfast. Take Xarelto for two and a half more weeks, then discontinue Xarelto.  - Once the patient has completed the  blood thinner regimen, then take a Baby 81 mg Aspirin daily for four more weeks.     TOVIAZ 8 MG Tb24 tablet  Generic drug:  fesoterodine  Take 8 mg by mouth every evening.     traMADol 50 MG tablet  Commonly known as:  ULTRAM  Take 1-2 tablets (50-100 mg total) by mouth every 6 (six) hours as needed (mild pain).       Follow-up Information   Follow up with Loanne Drilling, MD. Schedule an appointment as soon as possible for a visit on 10/07/2012. (Call [phone] tomorrow to make the appointment)    Specialty:  Orthopedic Surgery   Contact information:   [address] [place] [address] [phone]       Signed: Patrica Duel 09/28/2012, 9:35 AM

## 2012-09-24 NOTE — Care Management Note (Addendum)
    Page 1 of 1   09/24/2012     5:08:53 PM   CARE MANAGEMENT NOTE 09/24/2012  Patient:  Kristi Avila, Kristi Avila   Account Number:  [id number]  Date Initiated:  09/24/2012  Documentation initiated by:  Colleen Can  Subjective/Objective Assessment:   dx Osteoarthritis rt knee; total knee replacemnt     Action/Plan:   CM spoke with patient. Plans are for discharge to home where she will havew caregiver. She has access to RW and 3n1. Genevieve Norlander will provide [facility] services.   Anticipated DC Date:  09/24/2012   Anticipated DC Plan:  HOME W HOME HEALTH SERVICES      DC Planning Services  CM consult      [facility] Choice  HOME HEALTH   Choice offered to / List presented to:  C-1 Patient        HH arranged  HH-2 PT      [facility] agency  [facility]   Status of service:  Completed, signed off Medicare Important Message given?   (If response is "NO", the following Medicare IM given date fields will be blank) Date Medicare IM given:   Date Additional Medicare IM given:    Discharge Disposition:  HOME W HOME HEALTH SERVICES  Per UR Regulation:    If discussed at Long Length of Stay Meetings, dates discussed:    Comments:  09/24/2012 Colleen Can BSN RN CCM [phone] Genevieve Norlander will provide [facility] services with start date of tomorrow 09/25/2012

## 2012-09-24 NOTE — Progress Notes (Signed)
   Subjective: 2 Days Post-Op Procedure(s) (LRB): RIGHT TOTAL KNEE ARTHROPLASTY (Right) Patient reports pain as mild.   Patient seen in rounds with Dr. Lequita Halt. Patient is well, and has had no acute complaints or problems Patient is ready to go home  Objective: Vital signs in last 24 hours: Temp:  [98.3 F (36.8 C)-99 F (37.2 C)] 98.4 F (36.9 C) (09/10 0559) Pulse Rate:  [79-90] 90 (09/10 0559) Resp:  [16-18] 16 (09/10 0559) BP: (116-125)/(66-86) 116/76 mmHg (09/10 0559) SpO2:  [94 %-99 %] 94 % (09/10 0559)  Intake/Output from previous day:  Intake/Output Summary (Last 24 hours) at 09/24/12 1036 Last data filed at 09/24/12 0810  Gross per 24 hour  Intake   1040 ml  Output   5100 ml  Net  -4060 ml    Intake/Output this shift: Total I/O In: 240 [P.O.:240] Out: 500 [Urine:500]  Labs:  Recent Labs  09/23/12 0403 09/24/12 0410  HGB 10.8* 10.4*    Recent Labs  09/23/12 0403 09/24/12 0410  WBC 12.3* 11.5*  RBC 3.60* 3.52*  HCT 32.9* 32.5*  PLT 296 289    Recent Labs  09/23/12 0403 09/24/12 0410  NA 137 137  K 3.9 3.7  CL 105 102  CO2 27 28  BUN 8 11  CREATININE 0.78 0.78  GLUCOSE 104* 97  CALCIUM 9.0 9.2   No results found for this basename: LABPT, INR,  in the last 72 hours  EXAM: General - Patient is Alert, Appropriate and Oriented Extremity - Neurovascular intact Sensation intact distally Dorsiflexion/Plantar flexion intact Incision - clean, dry, no drainage Motor Function - intact, moving foot and toes well on exam.   Assessment/Plan: 2 Days Post-Op Procedure(s) (LRB): RIGHT TOTAL KNEE ARTHROPLASTY (Right) Procedure(s) (LRB): RIGHT TOTAL KNEE ARTHROPLASTY (Right) Past Medical History  Diagnosis Date  . Reflux   . High cholesterol   . Overactive bladder   . Obesity   . Anxiety   . Depression   . Arthritis   . HSV-2 infection     anal  . GERD (gastroesophageal reflux disease)     h/o peptic ulcer  . PONV (postoperative nausea  and vomiting)   . Frequency of urination   . Urgency of urination   . Nocturia    Principal Problem:   OA (osteoarthritis) of knee Active Problems:   Postoperative anemia due to acute blood loss  Estimated body mass index is 35.78 kg/(m^2) as calculated from the following:   Height as of this encounter: 5' 6.5" (1.689 m).   Weight as of this encounter: 102.059 kg (225 lb). Up with therapy Diet - Cardiac diet Follow up - in 2 weeks Activity - WBAT Disposition - Home Condition Upon Discharge - Good D/C Meds - See DC Summary DVT Prophylaxis - Xarelto  Kristi Avila 09/24/2012, 10:36 AM

## 2012-09-24 NOTE — Progress Notes (Signed)
Physical Therapy Treatment Patient Details Name: Kristi Avila MRN: [id number] DOB: 1955-06-26 Today's Date: 09/24/2012 Time: 4098-1191 PT Time Calculation (min): 34 min  PT Assessment / Plan / Recommendation  History of Present Illness s/p R TKA 09/22/12   PT Comments   Progressing with mobility. All education completed. No further questions from pt. Issued stair negotiation handout. Ready to d/c from PT standpoint. Recommend HHPT, RW.   Follow Up Recommendations  Home health PT     Does the patient have the potential to tolerate intense rehabilitation     Barriers to Discharge        Equipment Recommendations  Rolling walker with 5" wheels    Recommendations for Other Services OT consult  Frequency 7X/week   Progress towards PT Goals Progress towards PT goals: Progressing toward goals  Plan Current plan remains appropriate    Precautions / Restrictions Precautions Precautions: Knee;Fall Required Braces or Orthoses: Knee Immobilizer - Right Knee Immobilizer - Right: Discontinue once straight leg raise with < 10 degree lag Restrictions Weight Bearing Restrictions: No RLE Weight Bearing: Weight bearing as tolerated   Pertinent Vitals/Pain 7/10 R knee with activity. Ice applied end of session    Mobility  Bed Mobility Bed Mobility: Supine to Sit;Sit to Supine Supine to Sit: 4: Min assist Sit to Supine: 4: Min assist Details for Bed Mobility Assistance: Assist for R LE off/onto bed.  Transfers Transfers: Sit to Stand;Stand to Sit Sit to Stand: 4: Min guard;From bed;From chair/3-in-1 Stand to Sit: 4: Min guard;To bed;To chair/3-in-1 Details for Transfer Assistance: verbal cues for hand placement and safety.  Ambulation/Gait Ambulation/Gait Assistance: 4: Min guard Ambulation Distance (Feet): 110 Feet Assistive device: Rolling walker Gait Pattern: Step-to pattern;Step-through pattern;Decreased stride length;Antalgic;Trunk flexed;Decreased weight shift to right Stairs:  Yes Stairs Assistance: 4: Min assist Stairs Assistance Details (indicate cue type and reason): VCs safety, technique, sequence. Assist to stabilize walker. Handout issue as well. Stair Management Technique: Backwards;With walker;Step to pattern Number of Stairs: 2    Exercises Total Joint Exercises Ankle Circles/Pumps: AROM;Both;15 reps;Seated Quad Sets: AROM;Both;10 reps;Seated Hip ABduction/ADduction: AAROM;Right;10 reps;Seated Straight Leg Raises: AAROM;Right;10 reps;Seated Knee Flexion: AAROM;Right;10 reps;Seated Goniometric ROM: 10-60 degrees   PT Diagnosis:    PT Problem List:   PT Treatment Interventions:     PT Goals (current goals can now be found in the care plan section)    Visit Information  Last PT Received On: 09/24/12 Assistance Needed: +1 History of Present Illness: s/p R TKA 09/22/12    Subjective Data      Cognition  Cognition Arousal/Alertness: Awake/alert Behavior During Therapy: WFL for tasks assessed/performed Overall Cognitive Status: Within Functional Limits for tasks assessed    Balance     End of Session PT - End of Session Equipment Utilized During Treatment: Gait belt Activity Tolerance: Patient tolerated treatment well Patient left: in bed;with call bell/phone within reach   GP     Rebeca Alert, MPT Pager: [phone]

## 2012-09-24 NOTE — Progress Notes (Signed)
OT Cancellation Note  Patient Details Name: Kristi Avila MRN: [id number] DOB: 12-26-1955   Cancelled Treatment:    Reason Eval/Treat Not Completed: Other (comment) Pt states she has been up to the 3in1 several times and doesn't feel she needs to practice with OT further. She states significant other can assist with LB ADL.  Lennox Laity [phone] 09/24/2012, 9:14 AM

## 2012-09-25 NOTE — Progress Notes (Signed)
Discharge summary sent to payer through [facility]  

## 2012-09-29 ENCOUNTER — Other Ambulatory Visit: Payer: Self-pay | Admitting: Physician Assistant

## 2012-09-29 ENCOUNTER — Ambulatory Visit: Payer: Self-pay | Admitting: Physician Assistant

## 2012-09-29 NOTE — Telephone Encounter (Signed)
Last refill Xanax was 03/26/12  #30  Last OV 09/10/12  Need OK refill Xanax?

## 2012-09-29 NOTE — Telephone Encounter (Signed)
.?   OK to Refill lov 08/28/12

## 2012-09-29 NOTE — Telephone Encounter (Signed)
Approved plus one additional refill

## 2012-09-29 NOTE — Telephone Encounter (Signed)
Rx called in #30/1RF

## 2012-09-29 NOTE — Telephone Encounter (Signed)
???   Did she get # 90 on 09/24/12? That is what was on med list section that [name] just showed me--?? She just had knee surgery so probably needing med related to that but not if got 90 on 9/10 --?? Kim cna you check this

## 2012-10-06 ENCOUNTER — Encounter: Payer: Self-pay | Admitting: Physician Assistant

## 2012-10-06 DIAGNOSIS — K209 Esophagitis, unspecified without bleeding: Secondary | ICD-10-CM | POA: Insufficient documentation

## 2012-10-06 DIAGNOSIS — K2270 Barrett's esophagus without dysplasia: Secondary | ICD-10-CM | POA: Insufficient documentation

## 2012-12-23 ENCOUNTER — Telehealth: Payer: Self-pay | Admitting: Physician Assistant

## 2012-12-23 DIAGNOSIS — Z79899 Other long term (current) drug therapy: Secondary | ICD-10-CM

## 2012-12-23 DIAGNOSIS — E785 Hyperlipidemia, unspecified: Secondary | ICD-10-CM

## 2012-12-23 NOTE — Telephone Encounter (Signed)
Pt is wanting to get her cholesterol checked since she has been on a new medicine to see if its working and she is wanting to know if we are affiliated with another lab other than [facility] that we can fax her order to Call back number is [phone]

## 2012-12-24 NOTE — Telephone Encounter (Signed)
Has been on Zetia and wants to have labs done.  Also due for routine visit.  Fasting lipids and LFT's ordered and pt to make appt for routine follow up after labs done to see provider.

## 2012-12-26 ENCOUNTER — Telehealth: Payer: Self-pay | Admitting: Physician Assistant

## 2012-12-26 NOTE — Telephone Encounter (Signed)
[phone] Lab request cholesterol and liver check and iron check Kristi Avila was suppose to fax the request to [facility] this morning and it was in epic but [facility] is not able to see in epic Call back number is [phone]

## 2012-12-29 NOTE — Telephone Encounter (Signed)
Pt is calling because Kristi Avila still has not received the lab request and she has called several times about this matter and she would like to have it done today  [phone]  [phone]  Lab request cholesterol and liver check and iron check  [name] was suppose to fax the request to [facility] this morning and it was in epic but [facility] is not able to see in epic  Call back number is [phone]

## 2012-12-30 NOTE — Telephone Encounter (Signed)
Kristi Avila are you taking care of this? I over heard you say something to lab this morning about her.

## 2012-12-30 NOTE — Telephone Encounter (Signed)
Order faxed to lab.

## 2013-01-01 ENCOUNTER — Other Ambulatory Visit: Payer: Self-pay | Admitting: Physician Assistant

## 2013-01-01 LAB — HEPATIC FUNCTION PANEL
Albumin: 4.8 g/dL (ref 3.5–5.2)
Alkaline Phosphatase: 84 U/L (ref 39–117)
Bilirubin, Direct: 0.1 mg/dL (ref 0.0–0.3)
Total Bilirubin: 0.4 mg/dL (ref 0.3–1.2)

## 2013-01-01 LAB — LIPID PANEL
HDL: 58 mg/dL (ref >39)
LDL Cholesterol: 154 mg/dL — ABNORMAL HIGH (ref 0–99)

## 2013-01-05 ENCOUNTER — Telehealth: Payer: Self-pay | Admitting: Physician Assistant

## 2013-01-05 MED ORDER — EZETIMIBE 10 MG PO TABS: 10.0000 mg | ORAL_TABLET | Freq: Every evening | ORAL | Status: DC

## 2013-01-05 NOTE — Telephone Encounter (Signed)
Pt aware of lab results.  C/O sore throat, swollen glands, ears hurt, yellow secretions.  No fever.  Can you call something in for her?

## 2013-01-05 NOTE — Telephone Encounter (Signed)
Sorry that she needs to be seen. Otherwise she can use decongestants Tylenol Motrin for the pain.

## 2013-01-05 NOTE — Telephone Encounter (Signed)
Pt is calling today to find out her lab results Call back number is [phone]

## 2013-01-12 NOTE — Telephone Encounter (Signed)
Pt feeling better using OTC meds

## 2013-02-06 ENCOUNTER — Encounter: Payer: Self-pay | Admitting: Family Medicine

## 2013-02-06 DIAGNOSIS — M1990 Unspecified osteoarthritis, unspecified site: Secondary | ICD-10-CM | POA: Insufficient documentation

## 2013-02-18 ENCOUNTER — Telehealth: Payer: Self-pay | Admitting: *Deleted

## 2013-02-19 NOTE — Telephone Encounter (Signed)
Pt wants something other then Flonase because can't afford OTC

## 2013-02-19 NOTE — Telephone Encounter (Signed)
Can change to  Nasacort  2 sprays per nostril daily prn  dispense #1 with 11 refills

## 2013-02-20 MED ORDER — TRIAMCINOLONE ACETONIDE 55 MCG/ACT NA AERO: 2.0000 | INHALATION_SPRAY | Freq: Every day | NASAL | Status: DC

## 2013-02-20 NOTE — Telephone Encounter (Signed)
Pt aware of med change

## 2013-04-01 ENCOUNTER — Other Ambulatory Visit: Payer: Self-pay | Admitting: Physician Assistant

## 2013-04-01 NOTE — Telephone Encounter (Signed)
Medication refilled per protocol. 

## 2013-04-08 ENCOUNTER — Other Ambulatory Visit: Payer: Self-pay | Admitting: Physician Assistant

## 2013-04-08 NOTE — Telephone Encounter (Signed)
Refill appropriate and filled per protocol. 

## 2013-06-04 ENCOUNTER — Encounter: Payer: Self-pay | Admitting: Physician Assistant

## 2013-06-04 ENCOUNTER — Ambulatory Visit (INDEPENDENT_AMBULATORY_CARE_PROVIDER_SITE_OTHER): Payer: BC Managed Care – PPO | Admitting: Physician Assistant

## 2013-06-04 VITALS — BP 114/72 | HR 68 | Temp 98.0°F | Resp 18 | Ht 65.5 in | Wt 224.0 lb

## 2013-06-04 DIAGNOSIS — F32A Depression, unspecified: Secondary | ICD-10-CM

## 2013-06-04 DIAGNOSIS — M129 Arthropathy, unspecified: Secondary | ICD-10-CM

## 2013-06-04 DIAGNOSIS — Z01818 Encounter for other preprocedural examination: Secondary | ICD-10-CM

## 2013-06-04 DIAGNOSIS — K209 Esophagitis, unspecified without bleeding: Secondary | ICD-10-CM

## 2013-06-04 DIAGNOSIS — F3289 Other specified depressive episodes: Secondary | ICD-10-CM

## 2013-06-04 DIAGNOSIS — M1990 Unspecified osteoarthritis, unspecified site: Secondary | ICD-10-CM

## 2013-06-04 DIAGNOSIS — N3281 Overactive bladder: Secondary | ICD-10-CM

## 2013-06-04 DIAGNOSIS — F411 Generalized anxiety disorder: Secondary | ICD-10-CM

## 2013-06-04 DIAGNOSIS — M159 Polyosteoarthritis, unspecified: Secondary | ICD-10-CM

## 2013-06-04 DIAGNOSIS — F419 Anxiety disorder, unspecified: Secondary | ICD-10-CM

## 2013-06-04 DIAGNOSIS — F329 Major depressive disorder, single episode, unspecified: Secondary | ICD-10-CM

## 2013-06-04 DIAGNOSIS — IMO0002 Reserved for concepts with insufficient information to code with codable children: Secondary | ICD-10-CM

## 2013-06-04 DIAGNOSIS — K2270 Barrett's esophagus without dysplasia: Secondary | ICD-10-CM

## 2013-06-04 DIAGNOSIS — E7800 Pure hypercholesterolemia, unspecified: Secondary | ICD-10-CM

## 2013-06-04 DIAGNOSIS — M1710 Unilateral primary osteoarthritis, unspecified knee: Secondary | ICD-10-CM

## 2013-06-04 DIAGNOSIS — M179 Osteoarthritis of knee, unspecified: Secondary | ICD-10-CM

## 2013-06-04 DIAGNOSIS — K219 Gastro-esophageal reflux disease without esophagitis: Secondary | ICD-10-CM

## 2013-06-04 DIAGNOSIS — B009 Herpesviral infection, unspecified: Secondary | ICD-10-CM

## 2013-06-04 DIAGNOSIS — E669 Obesity, unspecified: Secondary | ICD-10-CM

## 2013-06-04 DIAGNOSIS — N318 Other neuromuscular dysfunction of bladder: Secondary | ICD-10-CM

## 2013-06-04 LAB — CBC WITH DIFFERENTIAL/PLATELET
BASOS ABS: 0.1 10*3/uL (ref 0.0–0.1)
BASOS PCT: 1 % (ref 0–1)
EOS ABS: 0.1 10*3/uL (ref 0.0–0.7)
Eosinophils Relative: 1 % (ref 0–5)
HCT: 36.4 % (ref 36.0–46.0)
Hemoglobin: 12.2 g/dL (ref 12.0–15.0)
LYMPHS ABS: 3.2 10*3/uL (ref 0.7–4.0)
Lymphocytes Relative: 38 % (ref 12–46)
MCH: 29.7 pg (ref 26.0–34.0)
MCHC: 33.5 g/dL (ref 30.0–36.0)
MCV: 88.6 fL (ref 78.0–100.0)
Monocytes Absolute: 0.7 10*3/uL (ref 0.1–1.0)
Monocytes Relative: 8 % (ref 3–12)
NEUTROS PCT: 52 % (ref 43–77)
Neutro Abs: 4.4 10*3/uL (ref 1.7–7.7)
PLATELETS: 346 10*3/uL (ref 150–400)
RBC: 4.11 MIL/uL (ref 3.87–5.11)
RDW: 13 % (ref 11.5–15.5)
WBC: 8.4 10*3/uL (ref 4.0–10.5)

## 2013-06-04 MED ORDER — ALPRAZOLAM 0.25 MG PO TABS: ORAL_TABLET | ORAL | Status: DC

## 2013-06-05 LAB — COMPLETE METABOLIC PANEL WITH GFR
ALK PHOS: 75 U/L (ref 39–117)
ALT: 25 U/L (ref 0–35)
AST: 18 U/L (ref 0–37)
Albumin: 4.4 g/dL (ref 3.5–5.2)
BILIRUBIN TOTAL: 0.2 mg/dL (ref 0.2–1.2)
BUN: 15 mg/dL (ref 6–23)
CO2: 29 meq/L (ref 19–32)
Calcium: 9.7 mg/dL (ref 8.4–10.5)
Chloride: 103 mEq/L (ref 96–112)
Creat: 0.9 mg/dL (ref 0.50–1.10)
GFR, Est African American: 83 mL/min
GFR, Est Non African American: 72 mL/min
Glucose, Bld: 93 mg/dL (ref 70–99)
Potassium: 4.1 mEq/L (ref 3.5–5.3)
SODIUM: 140 meq/L (ref 135–145)
TOTAL PROTEIN: 6.5 g/dL (ref 6.0–8.3)

## 2013-06-05 LAB — TSH: TSH: 0.966 u[IU]/mL (ref 0.350–4.500)

## 2013-06-06 NOTE — Progress Notes (Signed)
Patient ID: Kristi Avila MRN: [id number], DOB: Jul 30, 1955, 58 y.o. Date of Encounter: $RemoveBefor'@DATE'PNeRxBVXReIU$ @  Chief Complaint:  Chief Complaint  Patient presents with  . surgical clearance    having LTKR in September, refill xanax    HPI: 58 y.o. year old female  presents for Preop /Surgical Clearance.   She has no personal history of CAD, PVD, or diabetes. She has no family history of premature CAD or cardiovascular disease. She rides a stationary bike every morning for 15 minutes. Has no angina symptoms with this. No increased amount of chest pressure, heaviness, tightness and no increased amount of dyspnea on exertion compared to her usual baseline.  She sees the following medical providers on a routine basis/recently: Dr. Maureen Ralphs at [facility] for [facility] knees --she recently had Left TKR and now is scheduled for Right TKR Dr. McDirmiad, Urologist Says that she sees a dermatologist once a year Also sees gynecologist Dr. Ronita Hipps says she has seen GI in [facility] but only for EGD and colonoscopy  Hyperlipidemia:   simvastatin and pravastatin caused significant myalgias. She is now on Zetia and tolerating this. She's currently taking over-the-counter fish oil.   At her office visit with me on 03/26/2012 she reported  symptoms of overactive bladder. At that visit she reported several month history of urinary frequency nocturia and leaking at times. She was started on Detrol XL 2 mg daily. Since then she has called reporting continued symptoms and the dose was increased to 4 mg. She reports that even with the 4 mg she's continuing to have symptoms. She wakes up 4 to 5 times at night to urinate. As well she says that at the end of urination she has some leaking.  she had no adverse effects to the medication. She is now on Toviaz. This works well. Causes no adv effects.   Anxiety/depression she is  still on the Cymbalta 60 mg 2 tablet daily. This continues to control her anxiety and depression  and her mood is stable on medication.She has no adverse effects to this medication. She says that she rarely uses Xanax. Says that her current bottle has actually expired. However she does want to get a refill on this while she is here-- she wants to have it available if she has high stress at work or if cannot sleep at night.   Past Medical History  Diagnosis Date  . Reflux   . High cholesterol   . Overactive bladder   . Obesity   . Anxiety   . Depression   . Arthritis   . HSV-2 infection     anal  . GERD (gastroesophageal reflux disease)     h/o peptic ulcer  . PONV (postoperative nausea and vomiting)   . Frequency of urination   . Urgency of urination   . Nocturia   . Barrett's esophagus with esophagitis 07/23/12    EGD: CLO test results negative. pathology: Barretss: Repeat EGD 3 years     Home Meds:  Outpatient Prescriptions Prior to Visit  Medication Sig Dispense Refill  . acyclovir (ZOVIRAX) 400 MG tablet TAKE 1 TABLET BY MOUTH 2 TIMES DAILY  60 tablet  3  . docusate sodium (COLACE) 100 MG capsule Take 200 mg by mouth 2 (two) times daily.       . DULoxetine (CYMBALTA) 60 MG capsule Take 60 mg by mouth every morning.      . ezetimibe (ZETIA) 10 MG tablet Take 1 tablet (10 mg total) by mouth  every evening.  90 tablet  3  . Lidocaine-Hydrocortisone Ace 3-2.5 % KIT Place rectally 2 (two) times daily as needed (only with flare ups).      . meloxicam (MOBIC) 15 MG tablet take 1 tablet by mouth twice a day  60 tablet  3  . metoCLOPramide (REGLAN) 5 MG tablet Take 5 mg by mouth 4 (four) times daily.      Marland Kitchen omeprazole-sodium bicarbonate (ZEGERID) 40-1100 MG per capsule Take 1 capsule by mouth every evening.       Marland Kitchen ALPRAZolam (XANAX) 0.25 MG tablet TAKE 1 TABLET BY MOUTH EVERY DAY AS NEEDED  30 tablet  1  . fesoterodine (TOVIAZ) 8 MG TB24 tablet Take 8 mg by mouth every evening.      . methocarbamol (ROBAXIN) 500 MG tablet Take 1 tablet (500 mg total) by mouth every 6 (six) hours  as needed.  90 tablet  0  . oxyCODONE (OXY IR/ROXICODONE) 5 MG immediate release tablet Take 1-2 tablets (5-10 mg total) by mouth every 3 (three) hours as needed.  90 tablet  0  . rivaroxaban (XARELTO) 10 MG TABS tablet Take 1 tablet (10 mg total) by mouth daily with breakfast. Take Xarelto for two and a half more weeks, then discontinue Xarelto. Once the patient has completed the blood thinner regimen, then take a Baby 81 mg Aspirin daily for four more weeks.  19 tablet  0  . traMADol (ULTRAM) 50 MG tablet Take 1-2 tablets (50-100 mg total) by mouth every 6 (six) hours as needed (mild pain).  60 tablet  0  . triamcinolone (NASACORT) 55 MCG/ACT AERO nasal inhaler Place 2 sprays into the nose daily.  1 Inhaler  12   No facility-administered medications prior to visit.    Allergies:  Allergies  Allergen Reactions  . Cephalosporins   . Pravastatin     Myalgias  . Simvastatin     myalgias  . Sulfa Antibiotics     History   Social History  . Marital Status: Divorced    Spouse Name: N/A    Number of Children: N/A  . Years of Education: N/A   Occupational History  . Not on file.   Social History Main Topics  . Smoking status: Never Smoker   . Smokeless tobacco: Never Used  . Alcohol Use: Yes     Comment: X2 PER WK  . Drug Use: No  . Sexual Activity: Not on file   Other Topics Concern  . Not on file   Social History Narrative  . No narrative on file    Family History  Problem Relation Age of Onset  . Cancer Sister     pancreatic  . Diabetes Brother      Review of Systems:  See HPI for pertinent ROS. All other ROS negative.    Physical Exam: Blood pressure 114/72, pulse 68, temperature 98 F (36.7 C), temperature source Oral, resp. rate 18, height 5' 5.5" (1.664 m), weight 224 lb (101.606 kg)., Body mass index is 36.7 kg/(m^2). General: Obese white female. Appears in no acute distress. Neck: Supple. No thyromegaly. No lymphadenopathy.No carotid bruits. Lungs:  Clear bilaterally to auscultation without wheezes, rales, or rhonchi. Breathing is unlabored. Heart: RRR with S1 S2. No murmurs, rubs, or gallops. Abdomen: Soft, non-tender, non-distended with normoactive bowel sounds. No hepatomegaly. No rebound/guarding. No obvious abdominal masses. Musculoskeletal:  Strength and tone normal for age. Extremities/Skin: Warm and dry. No edema. No rashes or suspicious lesions. Neuro: Alert and oriented  X 3. Moves all extremities spontaneously. Gait is normal. CNII-XII grossly in tact. Psych:  Responds to questions appropriately with a normal affect.     ASSESSMENT AND PLAN:  58 y.o. year old female with    1. Preoperative evaluation to rule out surgical contraindication  EKG performed today. NSR 63 bpm. NonSpecific St/T changes. Normal.   She is not fasting today. She had lipid panel done 01/01/13.-- Triglycerides 163    HDL 58     LDL 154 Check other labs now to make sure they are stable preop. - CBC with Differential - COMPLETE METABOLIC PANEL WITH GFR - TSH  Once I obtain lab results, will send Dr. Maureen Ralphs OV note, EKG, Labs--she is medically stable to undergo surgery.     High cholesterol She is intolerant to statins. Continue Zetia and fish oil.   Overactive bladder Controlled with Toviaz  3. Obesity Have discussed diet and exercise multiple times.  Currently she rides her stationary bike for 15 every minutes every morning to " loosen her knee."  Currently cannot do other exercise secondary to her knee pain.  4. Anxiety Continue current dose of Cymbalta. - ALPRAZolam (XANAX) 0.25 MG tablet; TAKE 1 TABLET BY MOUTH EVERY DAY AS NEEDED  Dispense: 30 tablet; Refill: 1  5. Depression Continue current dose of Cymbalta  6. GERD (gastroesophageal reflux disease)  7. Osteoarthritis of multiple joints  8. HSV-2 infection  9. OA (osteoarthritis) of knee  10. Barrett's esophagus with esophagitis Per GI  11. Arthritis  She had a  complete physical exam 06/04/2011. All preventive care was updated at that time.  Colonoscopy:  6 /15/2007. Negative. Repeat 10 years. Screening labs: Performed 06/04/11. All were excellent. Immunizations:Tdap  Given here 06/04/2011 Pap: Done here 06/04/11 --Cytology was normal. HPV was not detected.  Marin Olp Doffing, [place], [facility] 06/06/2013 6:47 AM

## 2013-06-26 ENCOUNTER — Encounter: Payer: Self-pay | Admitting: Physician Assistant

## 2013-07-10 ENCOUNTER — Encounter (HOSPITAL_COMMUNITY): Payer: Self-pay | Admitting: Emergency Medicine

## 2013-07-10 ENCOUNTER — Emergency Department (HOSPITAL_COMMUNITY)
Admission: EM | Admit: 2013-07-10 | Discharge: 2013-07-10 | Disposition: A | Discharge status: Home / Self Care | Payer: BC Managed Care – PPO | Source: Home / Self Care | Attending: Family Medicine | Admitting: Family Medicine

## 2013-07-10 DIAGNOSIS — M545 Low back pain, unspecified: Secondary | ICD-10-CM

## 2013-07-10 MED ORDER — KETOROLAC TROMETHAMINE 60 MG/2ML IM SOLN
60.0000 mg | Freq: Once | INTRAMUSCULAR | Status: AC
  Administered 2013-07-10: 60 mg via INTRAMUSCULAR

## 2013-07-10 MED ORDER — KETOROLAC TROMETHAMINE 60 MG/2ML IM SOLN
INTRAMUSCULAR | Status: AC
  Filled 2013-07-10: qty 2

## 2013-07-10 MED ORDER — METHYLPREDNISOLONE ACETATE 80 MG/ML IJ SUSP
INTRAMUSCULAR | Status: AC
  Filled 2013-07-10: qty 1

## 2013-07-10 MED ORDER — HYDROMORPHONE HCL PF 1 MG/ML IJ SOLN
0.5000 mg | Freq: Once | INTRAMUSCULAR | Status: AC
  Administered 2013-07-10: 0.5 mg via INTRAMUSCULAR

## 2013-07-10 MED ORDER — HYDROMORPHONE HCL PF 1 MG/ML IJ SOLN
INTRAMUSCULAR | Status: AC
  Filled 2013-07-10: qty 1

## 2013-07-10 MED ORDER — METHYLPREDNISOLONE ACETATE 80 MG/ML IJ SUSP
80.0000 mg | Freq: Once | INTRAMUSCULAR | Status: AC
  Administered 2013-07-10: 80 mg via INTRAMUSCULAR

## 2013-07-10 NOTE — ED Provider Notes (Signed)
Kristi Avila is a 58 y.o. female who presents to Urgent Care today for back pain. Patient has acute onset of low back pain starting this morning after she bent forward to pick up an object. She has a history of intermittent back pain secondary to degenerative disc disease. She denies any radiating pain weakness numbness bowel bladder dysfunction. She's not tried any medications yet. She feels well otherwise. No fevers or chills nausea vomiting or diarrhea   Past Medical History  Diagnosis Date  . Reflux   . High cholesterol   . Overactive bladder   . Obesity   . Anxiety   . Depression   . Arthritis   . HSV-2 infection     anal  . GERD (gastroesophageal reflux disease)     h/o peptic ulcer  . PONV (postoperative nausea and vomiting)   . Frequency of urination   . Urgency of urination   . Nocturia   . Barrett's esophagus with esophagitis 07/23/12    EGD: CLO test results negative. pathology: Barretss: Repeat EGD 3 years   History  Substance Use Topics  . Smoking status: Never Smoker   . Smokeless tobacco: Never Used  . Alcohol Use: Yes     Comment: X2 PER WK   ROS as above Medications: No current facility-administered medications for this encounter.   Current Outpatient Prescriptions  Medication Sig Dispense Refill  . acyclovir (ZOVIRAX) 400 MG tablet TAKE 1 TABLET BY MOUTH 2 TIMES DAILY  60 tablet  3  . docusate sodium (COLACE) 100 MG capsule Take 200 mg by mouth 2 (two) times daily.       . DULoxetine (CYMBALTA) 60 MG capsule Take 60 mg by mouth every morning.      . ezetimibe (ZETIA) 10 MG tablet Take 1 tablet (10 mg total) by mouth every evening.  90 tablet  3  . fluticasone (FLONASE) 50 MCG/ACT nasal spray Place 2 sprays into both nostrils daily.      . meloxicam (MOBIC) 15 MG tablet take 1 tablet by mouth twice a day  60 tablet  3  . metoCLOPramide (REGLAN) 5 MG tablet Take 5 mg by mouth 4 (four) times daily.      . mirabegron ER (MYRBETRIQ) 25 MG TB24 tablet Take 25 mg  by mouth daily.      Marland Kitchen omeprazole-sodium bicarbonate (ZEGERID) 40-1100 MG per capsule Take 1 capsule by mouth every evening.       Marland Kitchen oxybutynin (DITROPAN-XL) 10 MG 24 hr tablet Take 1 tablet by mouth daily.      Marland Kitchen ALPRAZolam (XANAX) 0.25 MG tablet TAKE 1 TABLET BY MOUTH EVERY DAY AS NEEDED  30 tablet  1  . Lidocaine-Hydrocortisone Ace 3-2.5 % KIT Place rectally 2 (two) times daily as needed (only with flare ups).        Exam:  BP 113/74  Pulse 76  Temp(Src) 98.5 F (36.9 C)  SpO2 96% Gen: Well NAD HEENT: EOMI,  MMM Lungs: Normal work of breathing. CTABL Heart: RRR no MRG Abd: NABS, Soft. NT, ND Exts: Brisk capillary refill, warm and well perfused.  Back: Nontender to spinal midline. Tender palpation bilateral SI joint. Decreased back range of motion secondary to pain. Lower extremity strength sensation reflexes are intact. Pulses capillary refill are intact distally.  Patient was given intramuscular Depo-Medrol, hydromorphone, and Toradol prior to discharge  No results found for this or any previous visit (from the past 24 hour(s)). No results found.  Assessment and Plan: 58  y.o. female with back pain due to lumbar myofascial disruption. Plan to use NSAIDs, and muscle relaxers at home. Followup as needed.  Discussed warning signs or symptoms. Please see discharge instructions. Patient expresses understanding.    Gregor Hams, MD 07/10/13 [phone]

## 2013-07-10 NOTE — ED Notes (Signed)
Patient here with back pain from arthritis and degenerative disease. Pain happened this am.  Kristi Avila to see Dr. Nelva Bush about 6 months ago and had an injection, but states injection has worn off.

## 2013-07-10 NOTE — Discharge Instructions (Signed)
Thank you for coming in today. Take your old pain medicines and muscle relaxers as needed.  Come back or go to the emergency room if you notice new weakness new numbness problems walking or bowel or bladder problems.  Back Pain, Adult Low back pain is very common. About 1 in 5 people have back pain.The cause of low back pain is rarely dangerous. The pain often gets better over time.About half of people with a sudden onset of back pain feel better in just 2 weeks. About 8 in 10 people feel better by 6 weeks.  CAUSES Some common causes of back pain include:  Strain of the muscles or ligaments supporting the spine.  Wear and tear (degeneration) of the spinal discs.  Arthritis.  Direct injury to the back. DIAGNOSIS Most of the time, the direct cause of low back pain is not known.However, back pain can be treated effectively even when the exact cause of the pain is unknown.Answering your caregiver's questions about your overall health and symptoms is one of the most accurate ways to make sure the cause of your pain is not dangerous. If your caregiver needs more information, he or she may order lab work or imaging tests (X-rays or MRIs).However, even if imaging tests show changes in your back, this usually does not require surgery. HOME CARE INSTRUCTIONS For many people, back pain returns.Since low back pain is rarely dangerous, it is often a condition that people can learn to [facility] their own.   Remain active. It is stressful on the back to sit or stand in one place. Do not sit, drive, or stand in one place for more than 30 minutes at a time. Take short walks on level surfaces as soon as pain allows.Try to increase the length of time you walk each day.  Do not stay in bed.Resting more than 1 or 2 days can delay your recovery.  Do not avoid exercise or work.Your body is made to move.It is not dangerous to be active, even though your back may hurt.Your back will likely heal faster if  you return to being active before your pain is gone.  Pay attention to your body when you bend and lift. Many people have less discomfortwhen lifting if they bend their knees, keep the load close to their bodies,and avoid twisting. Often, the most comfortable positions are those that put less stress on your recovering back.  Find a comfortable position to sleep. Use a firm mattress and lie on your side with your knees slightly bent. If you lie on your back, put a pillow under your knees.  Only take over-the-counter or prescription medicines as directed by your caregiver. Over-the-counter medicines to reduce pain and inflammation are often the most helpful.Your caregiver may prescribe muscle relaxant drugs.These medicines help dull your pain so you can more quickly return to your normal activities and healthy exercise.  Put ice on the injured area.  Put ice in a plastic bag.  Place a towel between your skin and the bag.  Leave the ice on for 15-20 minutes, 03-04 times a day for the first 2 to 3 days. After that, ice and heat may be alternated to reduce pain and spasms.  Ask your caregiver about trying back exercises and gentle massage. This may be of some benefit.  Avoid feeling anxious or stressed.Stress increases muscle tension and can worsen back pain.It is important to recognize when you are anxious or stressed and learn ways to manage it.Exercise is a great option.  SEEK MEDICAL CARE IF:  You have pain that is not relieved with rest or medicine.  You have pain that does not improve in 1 week.  You have new symptoms.  You are generally not feeling well. SEEK IMMEDIATE MEDICAL CARE IF:   You have pain that radiates from your back into your legs.  You develop new bowel or bladder control problems.  You have unusual weakness or numbness in your arms or legs.  You develop nausea or vomiting.  You develop abdominal pain.  You feel faint. Document Released: 01/01/2005  Document Revised: 07/03/2011 Document Reviewed: 05/22/2010 [facility] Patient Information 2015 [place], [place]. This information is not intended to replace advice given to you by your health care provider. Make sure you discuss any questions you have with your health care provider.

## 2013-07-20 ENCOUNTER — Telehealth: Payer: Self-pay | Admitting: Physician Assistant

## 2013-07-20 MED ORDER — MIRABEGRON ER 25 MG PO TB24: 25.0000 mg | ORAL_TABLET | Freq: Every day | ORAL | Status: DC

## 2013-07-20 NOTE — Telephone Encounter (Signed)
Patient says she is trying to get medication refill on one of her medications it starts with an

## 2013-07-20 NOTE — Telephone Encounter (Signed)
Patient needs refill on her mirabegron ER (MYRBETRIQ) 25 MG TB24 tablet [id number]

## 2013-07-20 NOTE — Telephone Encounter (Signed)
Med refilled.

## 2013-08-10 ENCOUNTER — Other Ambulatory Visit: Payer: Self-pay | Admitting: Physician Assistant

## 2013-08-10 NOTE — Telephone Encounter (Signed)
Refill appropriate and filled per protocol. 

## 2013-08-20 ENCOUNTER — Other Ambulatory Visit: Payer: Self-pay | Admitting: Orthopedic Surgery

## 2013-08-20 NOTE — Progress Notes (Signed)
Preoperative surgical orders have been place into the Epic hospital system for Kristi Avila on 08/20/2013, 10:37 AM  by Mickel Crow for surgery on 09/28/2013.  Preop Total Knee orders including Experal, IV Tylenol, and IV Decadron as long as there are no contraindications to the above medications. Arlee Muslim, PA-C

## 2013-08-23 ENCOUNTER — Other Ambulatory Visit: Payer: Self-pay | Admitting: Physician Assistant

## 2013-08-24 NOTE — Telephone Encounter (Signed)
Prescription sent to pharmacy.

## 2013-09-14 ENCOUNTER — Encounter (HOSPITAL_COMMUNITY): Payer: Self-pay | Admitting: Pharmacy Technician

## 2013-09-16 ENCOUNTER — Other Ambulatory Visit (HOSPITAL_COMMUNITY): Payer: Self-pay | Admitting: Orthopedic Surgery

## 2013-09-16 NOTE — Patient Instructions (Addendum)
21 Kristi Avila  09/16/2013   Your procedure is scheduled on: Monday September 14th, 2015  Report to [facility] Main Entrance and follow signs to  [facility] at 625 AM.  Call this number if you have problems the morning of surgery [phone]   Remember: short stay will draw your blood type morning of surgery.  Do not eat food or drink liquids :After Midnight.     Take these medicines the morning of surgery with A SIP OF WATER: cymbalta, fluticasone nasal spray, ditropan, myrbetriq                               You may not have any metal on your body including hair pins and piercings  Do not wear jewelry, make-up, lotions, powders, or deodorant.   Men may shave face and neck.  Do not bring valuables to the hospital. [place].  Contacts, dentures or bridgework may not be worn into surgery.  Leave suitcase in the car. After surgery it may be brought to your room.  For patients admitted to the hospital, checkout time is 11:00 AM the day of discharge.   Patients discharged the day of surgery will not be allowed to drive home.  Name and phone number of your driver:  Special Instructions: N/A ________________________________________________________________________  [facility] - Preparing for Surgery Before surgery, you can play an important role.  Because skin is not sterile, your skin needs to be as free of germs as possible.  You can reduce the number of germs on your skin by washing with CHG (chlorahexidine gluconate) soap before surgery.  CHG is an antiseptic cleaner which kills germs and bonds with the skin to continue killing germs even after washing. Please DO NOT use if you have an allergy to CHG or antibacterial soaps.  If your skin becomes reddened/irritated stop using the CHG and inform your nurse when you arrive at Short Stay. Do not shave (including legs and underarms) for at least 48 hours prior to the first CHG shower.  You  may shave your face/neck. Please follow these instructions carefully:  1.  Shower with CHG Soap the night before surgery and the  morning of Surgery.  2.  If you choose to wash your hair, wash your hair first as usual with your  normal  shampoo.  3.  After you shampoo, rinse your hair and body thoroughly to remove the  shampoo.                           4.  Use CHG as you would any other liquid soap.  You can apply chg directly  to the skin and wash                       Gently with a scrungie or clean washcloth.  5.  Apply the CHG Soap to your body ONLY FROM THE NECK DOWN.   Do not use on face/ open                           Wound or open sores. Avoid contact with eyes, ears mouth and genitals (private parts).                       Wash face,  Genitals (private parts) with your normal soap.             6.  Wash thoroughly, paying special attention to the area where your surgery  will be performed.  7.  Thoroughly rinse your body with warm water from the neck down.  8.  DO NOT shower/wash with your normal soap after using and rinsing off  the CHG Soap.                9.  Pat yourself dry with a clean towel.            10.  Wear clean pajamas.            11.  Place clean sheets on your bed the night of your first shower and do not  sleep with pets. Day of Surgery : Do not apply any lotions/deodorants the morning of surgery.  Please wear clean clothes to the hospital/surgery center.  FAILURE TO FOLLOW THESE INSTRUCTIONS MAY RESULT IN THE CANCELLATION OF YOUR SURGERY PATIENT SIGNATURE_________________________________  NURSE SIGNATURE__________________________________  ________________________________________________________________________   Adam Phenix  An incentive spirometer is a tool that can help keep your lungs clear and active. This tool measures how well you are filling your lungs with each breath. Taking long deep breaths may help reverse or decrease the chance of  developing breathing (pulmonary) problems (especially infection) following:  A long period of time when you are unable to move or be active. BEFORE THE PROCEDURE   If the spirometer includes an indicator to show your best effort, your nurse or respiratory therapist will set it to a desired goal.  If possible, sit up straight or lean slightly forward. Try not to slouch.  Hold the incentive spirometer in an upright position. INSTRUCTIONS FOR USE  1. Sit on the edge of your bed if possible, or sit up as far as you can in bed or on a chair. 2. Hold the incentive spirometer in an upright position. 3. Breathe out normally. 4. Place the mouthpiece in your mouth and seal your lips tightly around it. 5. Breathe in slowly and as deeply as possible, raising the piston or the ball toward the top of the column. 6. Hold your breath for 3-5 seconds or for as long as possible. Allow the piston or ball to fall to the bottom of the column. 7. Remove the mouthpiece from your mouth and breathe out normally. 8. Rest for a few seconds and repeat Steps 1 through 7 at least 10 times every 1-2 hours when you are awake. Take your time and take a few normal breaths between deep breaths. 9. The spirometer may include an indicator to show your best effort. Use the indicator as a goal to work toward during each repetition. 10. After each set of 10 deep breaths, practice coughing to be sure your lungs are clear. If you have an incision (the cut made at the time of surgery), support your incision when coughing by placing a pillow or rolled up towels firmly against it. Once you are able to get out of bed, walk around indoors and cough well. You may stop using the incentive spirometer when instructed by your caregiver.  RISKS AND COMPLICATIONS  Take your time so you do not get dizzy or light-headed.  If you are in pain, you may need to take or ask for pain medication before doing incentive spirometry. It is harder to take a  deep breath if you are having pain. AFTER USE  Rest and breathe slowly and easily.  It can be helpful to keep track of a log of your progress. Your caregiver can provide you with a simple table to help with this. If you are using the spirometer at home, follow these instructions: [place] IF:   You are having difficultly using the spirometer.  You have trouble using the spirometer as often as instructed.  Your pain medication is not giving enough relief while using the spirometer.  You develop fever of 100.5 F (38.1 C) or higher. SEEK IMMEDIATE MEDICAL CARE IF:   You cough up bloody sputum that had not been present before.  You develop fever of 102 F (38.9 C) or greater.  You develop worsening pain at or near the incision site. MAKE SURE YOU:   Understand these instructions.  Will watch your condition.  Will get help right away if you are not doing well or get worse. Document Released: 05/14/2006 Document Revised: 03/26/2011 Document Reviewed: 07/15/2006 Exit[employer] Patient Information 2014 [employer], [place].   ________________________________________________________________________  WHAT IS A BLOOD TRANSFUSION? Blood Transfusion Information  A transfusion is the replacement of blood or some of its parts. Blood is made up of multiple cells which provide different functions.  Red blood cells carry oxygen and are used for blood loss replacement.  White blood cells fight against infection.  Platelets control bleeding.  Plasma helps clot blood.  Other blood products are available for specialized needs, such as hemophilia or other clotting disorders. BEFORE THE TRANSFUSION  Who gives blood for transfusions?   Healthy volunteers who are fully evaluated to make sure their blood is safe. This is blood bank blood. Transfusion therapy is the safest it has ever been in the practice of medicine. Before blood is taken from a donor, a complete history is taken to make  sure that person has no history of diseases nor engages in risky social behavior (examples are intravenous drug use or sexual activity with multiple partners). The donor's travel history is screened to minimize risk of transmitting infections, such as malaria. The donated blood is tested for signs of infectious diseases, such as HIV and hepatitis. The blood is then tested to be sure it is compatible with you in order to minimize the chance of a transfusion reaction. If you or a relative donates blood, this is often done in anticipation of surgery and is not appropriate for emergency situations. It takes many days to process the donated blood. RISKS AND COMPLICATIONS Although transfusion therapy is very safe and saves many lives, the main dangers of transfusion include:   Getting an infectious disease.  Developing a transfusion reaction. This is an allergic reaction to something in the blood you were given. Every precaution is taken to prevent this. The decision to have a blood transfusion has been considered carefully by your caregiver before blood is given. Blood is not given unless the benefits outweigh the risks. AFTER THE TRANSFUSION  Right after receiving a blood transfusion, you will usually feel much better and more energetic. This is especially true if your red blood cells have gotten low (anemic). The transfusion raises the level of the red blood cells which carry oxygen, and this usually causes an energy increase.  The nurse administering the transfusion will monitor you carefully for complications. HOME CARE INSTRUCTIONS  No special instructions are needed after a transfusion. You may find your energy is better. Speak with your caregiver about any limitations on activity for underlying diseases you may have. SEEK  MEDICAL CARE IF:   Your condition is not improving after your transfusion.  You develop redness or irritation at the intravenous (IV) site. SEEK IMMEDIATE MEDICAL CARE IF:   Any of the following symptoms occur over the next 12 hours:  Shaking chills.  You have a temperature by mouth above 102 F (38.9 C), not controlled by medicine.  Chest, back, or muscle pain.  People around you feel you are not acting correctly or are confused.  Shortness of breath or difficulty breathing.  Dizziness and fainting.  You get a rash or develop hives.  You have a decrease in urine output.  Your urine turns a dark color or changes to pink, red, or brown. Any of the following symptoms occur over the next 10 days:  You have a temperature by mouth above 102 F (38.9 C), not controlled by medicine.  Shortness of breath.  Weakness after normal activity.  The white part of the eye turns yellow (jaundice).  You have a decrease in the amount of urine or are urinating less often.  Your urine turns a dark color or changes to pink, red, or brown. Document Released: 12/30/1999 Document Revised: 03/26/2011 Document Reviewed: 08/18/2007 [facility] Patient Information 2014 [place], [place].  _______________________________________________________________________

## 2013-09-17 ENCOUNTER — Ambulatory Visit (HOSPITAL_COMMUNITY)
Admission: RE | Admit: 2013-09-17 | Discharge: 2013-09-17 | Disposition: A | Discharge status: Home / Self Care | Payer: BC Managed Care – PPO | Source: Ambulatory Visit | Attending: Anesthesiology | Admitting: Anesthesiology

## 2013-09-17 ENCOUNTER — Inpatient Hospital Stay (HOSPITAL_COMMUNITY): Admission: RE | Admit: 2013-09-17 | Payer: BC Managed Care – PPO | Source: Ambulatory Visit

## 2013-09-17 ENCOUNTER — Encounter (HOSPITAL_COMMUNITY)
Admission: RE | Admit: 2013-09-17 | Discharge: 2013-09-17 | Disposition: A | Discharge status: Home / Self Care | Payer: BC Managed Care – PPO | Source: Ambulatory Visit | Attending: Orthopedic Surgery | Admitting: Orthopedic Surgery

## 2013-09-17 ENCOUNTER — Encounter (HOSPITAL_COMMUNITY): Payer: Self-pay

## 2013-09-17 DIAGNOSIS — R0602 Shortness of breath: Secondary | ICD-10-CM | POA: Insufficient documentation

## 2013-09-17 HISTORY — DX: Family history of other specified conditions: Z84.89

## 2013-09-17 LAB — COMPREHENSIVE METABOLIC PANEL
ALBUMIN: 4.1 g/dL (ref 3.5–5.2)
ALT: 22 U/L (ref 0–35)
AST: 17 U/L (ref 0–37)
Alkaline Phosphatase: 86 U/L (ref 39–117)
Anion gap: 11 (ref 5–15)
BUN: 14 mg/dL (ref 6–23)
CALCIUM: 10 mg/dL (ref 8.4–10.5)
CO2: 27 mEq/L (ref 19–32)
CREATININE: 0.86 mg/dL (ref 0.50–1.10)
Chloride: 95 mEq/L — ABNORMAL LOW (ref 96–112)
GFR calc Af Amer: 86 mL/min — ABNORMAL LOW (ref >90)
GFR calc non Af Amer: 74 mL/min — ABNORMAL LOW (ref >90)
Glucose, Bld: 83 mg/dL (ref 70–99)
Potassium: 4.3 mEq/L (ref 3.7–5.3)
Sodium: 133 mEq/L — ABNORMAL LOW (ref 137–147)
Total Protein: 7.4 g/dL (ref 6.0–8.3)

## 2013-09-17 LAB — CBC
HEMATOCRIT: 38.6 % (ref 36.0–46.0)
Hemoglobin: 12.9 g/dL (ref 12.0–15.0)
MCH: 30.3 pg (ref 26.0–34.0)
MCHC: 33.4 g/dL (ref 30.0–36.0)
MCV: 90.6 fL (ref 78.0–100.0)
PLATELETS: 321 10*3/uL (ref 150–400)
RBC: 4.26 MIL/uL (ref 3.87–5.11)
RDW: 12.2 % (ref 11.5–15.5)
WBC: 9 10*3/uL (ref 4.0–10.5)

## 2013-09-17 LAB — PROTIME-INR
INR: 0.88 (ref 0.00–1.49)
Prothrombin Time: 11.9 s (ref 11.6–15.2)

## 2013-09-17 LAB — SURGICAL PCR SCREEN
MRSA, PCR: NEGATIVE
STAPHYLOCOCCUS AUREUS: POSITIVE — AB

## 2013-09-17 LAB — URINALYSIS, ROUTINE W REFLEX MICROSCOPIC
Bilirubin Urine: NEGATIVE
Glucose, UA: NEGATIVE mg/dL
Hgb urine dipstick: NEGATIVE
Ketones, ur: NEGATIVE mg/dL
Leukocytes, UA: NEGATIVE
Nitrite: NEGATIVE
Protein, ur: NEGATIVE mg/dL
Specific Gravity, Urine: 1.007 (ref 1.005–1.030)
Urobilinogen, UA: 0.2 mg/dL (ref 0.0–1.0)
pH: 6 (ref 5.0–8.0)

## 2013-09-17 LAB — APTT: aPTT: 31 seconds (ref 24–37)

## 2013-09-17 NOTE — Progress Notes (Addendum)
Chest xray done 09-17-13 due to pt c/o sob last 6 months EKG 06-04-13 EPIC AND MEDICAL CLEARANCE NOTE Dena Billet PA EPIC

## 2013-09-17 NOTE — Progress Notes (Signed)
09/17/13 1434  OBSTRUCTIVE SLEEP APNEA  Have you ever been diagnosed with sleep apnea through a sleep study? No  Do you snore loudly (loud enough to be heard through closed doors)?  1  Do you often feel tired, fatigued, or sleepy during the daytime? 1  Has anyone observed you stop breathing during your sleep? 0  Do you have, or are you being treated for high blood pressure? 0  BMI more than 35 kg/m2? 1  Age over 57 years old? 1  Neck circumference greater than 40 cm/16 inches? 0  Gender: 0  Obstructive Sleep Apnea Score 4  Score 4 or greater  Results sent to PCP

## 2013-09-23 ENCOUNTER — Other Ambulatory Visit: Payer: Self-pay | Admitting: Surgical

## 2013-09-23 ENCOUNTER — Other Ambulatory Visit (HOSPITAL_COMMUNITY): Payer: BC Managed Care – PPO

## 2013-09-23 NOTE — H&P (Signed)
TOTAL KNEE ADMISSION H&P  Patient is being admitted for left total knee arthroplasty.  Subjective:  Chief Complaint:left knee pain.  HPI: Kristi Avila, 58 y.o. female, has a history of pain and functional disability in the left knee due to arthritis and has failed non-surgical conservative treatments for greater than 12 weeks to includeNSAID's and/or analgesics, corticosteriod injections, flexibility and strengthening excercises and activity modification.  Onset of symptoms was gradual, starting >10 years ago with gradually worsening course since that time. The patient noted prior procedures on the knee to include  arthroscopy and menisectomy on the left knee(s).  Patient currently rates pain in the left knee(s) at 7 out of 10 with activity. Patient has night pain, worsening of pain with activity and weight bearing, pain that interferes with activities of daily living, pain with passive range of motion, crepitus and joint swelling.  Patient has evidence of periarticular osteophytes and joint space narrowing by imaging studies.  There is no active infection.  Patient Active Problem List   Diagnosis Date Noted  . Arthritis   . Barrett's esophagus with esophagitis   . OA (osteoarthritis) of knee 09/22/2012  . Osteoarthritis of multiple joints 09/12/2012  . HSV-2 infection   . High cholesterol   . Overactive bladder   . Obesity   . Anxiety   . Depression   . GERD (gastroesophageal reflux disease)    Past Medical History  Diagnosis Date  . Reflux   . High cholesterol   . Overactive bladder   . Obesity   . Anxiety   . Depression   . Arthritis   . HSV-2 infection     anal  . GERD (gastroesophageal reflux disease)     h/o peptic ulcer  . Frequency of urination   . Urgency of urination   . Nocturia   . Barrett's esophagus with esophagitis 07/23/12    EGD: CLO test results negative. pathology: [place]: Repeat EGD 3 years  . PONV (postoperative nausea and vomiting) 09-22-12    felt weak  for 6 months after surgery, had spinal  . Family history of anesthesia complication     mother trouble waking up  . Shortness of breath     last 6 months    Past Surgical History  Procedure Laterality Date  . Arthroscopy knee w/ drilling      X4 ON RT KNEE  . Sinus exploration  1995  . Carpal tunnel release Right 2000  . Toe amputation  1959  . Hemorrhoid surgery  04/2006  . Knee arthroscopy      X2 LEFT KNEE  . Total knee arthroplasty Right 09/22/2012    Procedure: RIGHT TOTAL KNEE ARTHROPLASTY;  Surgeon: [name], MD;  Location: [facility] ORS;  Service: Orthopedics;  Laterality: Right;  . Foot fibroma surgery  all before 1985    x 3  . Colonscopy  2007     Current outpatient prescriptions: acyclovir (ZOVIRAX) 400 MG tablet, Take 400 mg by mouth every evening., Disp: , Rfl: ;   ALPRAZolam (XANAX) 0.25 MG tablet, Take 0.25 mg by mouth at bedtime., Disp: , Rfl: ;   Cyanocobalamin (VITAMIN B 12 PO), Take 1,000 mcg by mouth every morning., Disp: , Rfl: ;   docusate sodium (COLACE) 100 MG capsule, Take 100 mg by mouth 2 (two) times daily. , Disp: , Rfl:  DULoxetine (CYMBALTA) 60 MG capsule, Take 60 mg by mouth every morning., Disp: , Rfl: ;   ezetimibe (ZETIA) 10 MG tablet, Take  10 mg by mouth every evening., Disp: , Rfl: ;   fluticasone (FLONASE) 50 MCG/ACT nasal spray, Place 1 spray into both nostrils daily., Disp: , Rfl: ;   Lidocaine-Hydrocortisone Ace 3-2.5 % KIT, Place rectally 2 (two) times daily as needed (only with flare ups)., Disp: , Rfl:  meloxicam (MOBIC) 15 MG tablet, Take 15 mg by mouth daily., Disp: , Rfl: ;   metoCLOPramide (REGLAN) 10 MG tablet, Take 10 mg by mouth 2 (two) times daily., Disp: , Rfl: ;   mirabegron ER (MYRBETRIQ) 25 MG TB24 tablet, Take 25 mg by mouth every morning., Disp: , Rfl: ;   Multiple Vitamin (MULTIVITAMIN) tablet, Take 1 tablet by mouth every evening., Disp: , Rfl:  Multiple Vitamins-Minerals (VITAMIN D3 COMPLETE PO), Take by mouth every  morning., Disp: , Rfl: ;   omeprazole-sodium bicarbonate (ZEGERID) 40-1100 MG per capsule, Take 1 capsule by mouth every evening., Disp: , Rfl: ;   oxybutynin (DITROPAN-XL) 10 MG 24 hr tablet, Take 1 tablet by mouth daily., Disp: , Rfl:   Allergies  Allergen Reactions  . Cephalosporins Diarrhea  . Codeine Itching  . Latex Itching  . Pravastatin     Myalgias  . Simvastatin     myalgias  . Sulfa Antibiotics Diarrhea    History  Substance Use Topics  . Smoking status: Never Smoker   . Smokeless tobacco: Never Used  . Alcohol Use: Yes     Comment: X2 PER WK    Family History  Problem Relation Age of Onset  . Cancer Sister     pancreatic  . Diabetes Brother      Review of Systems  Constitutional: Positive for malaise/fatigue. Negative for fever, chills, weight loss and diaphoresis.  HENT: Negative.   Eyes: Negative.   Respiratory: Negative.   Cardiovascular: Negative.   Gastrointestinal: Positive for constipation. Negative for heartburn, nausea, vomiting, abdominal pain, diarrhea, blood in stool and melena.  Genitourinary: Positive for frequency. Negative for dysuria, urgency, hematuria and flank pain.  Musculoskeletal: Positive for back pain and joint pain. Negative for falls, myalgias and neck pain.       Left knee pain  Skin: Negative.   Neurological: Negative.  Negative for weakness.  Endo/Heme/Allergies: Negative.   Psychiatric/Behavioral: Negative.     Objective:  Physical Exam  Constitutional: She is oriented to person, place, and time. She appears well-developed. No distress.  Obese  HENT:  Head: Normocephalic and atraumatic.  Right Ear: External ear normal.  Left Ear: External ear normal.  Nose: Nose normal.  Mouth/Throat: Oropharynx is clear and moist.  Eyes: Conjunctivae and EOM are normal.  Neck: Normal range of motion. Neck supple.  Cardiovascular: Normal rate, regular rhythm, normal heart sounds and intact distal pulses.   No murmur  heard. Respiratory: Effort normal and breath sounds normal. No respiratory distress. She has no wheezes.  GI: Soft. Bowel sounds are normal. She exhibits no distension. There is no tenderness.  Musculoskeletal:       Right hip: Normal.       Left hip: Normal.       Right knee: She exhibits decreased range of motion. She exhibits no swelling, no effusion and no erythema. No tenderness found.       Left knee: She exhibits swelling. She exhibits normal range of motion, no effusion and no erythema. Tenderness found. Medial joint line and lateral joint line tenderness noted.       Right lower leg: She exhibits no tenderness and no swelling.  Left lower leg: She exhibits no tenderness and no swelling.  Right knee range of motion is about 5-130. There is no tenderness or instability. Her left knee has marked crepitus on range of motion. Range is 0-130 with tenderness medial greater than lateral with no instability.   Neurological: She is alert and oriented to person, place, and time. She has normal strength and normal reflexes. No sensory deficit.  Skin: No rash noted. She is not diaphoretic. No erythema.  Psychiatric: She has a normal mood and affect. Her behavior is normal.    Vitals  Weight: 218 lb Height: 66.5in Body Surface Area: 2.15 m Body Mass Index: 34.66 kg/m Pulse: 72 (Regular)  BP: 118/70 (Sitting, Left Arm, Standard)  Imaging Review Plain radiographs demonstrate severe degenerative joint disease of the left knee(s). The overall alignment ismild varus. The bone quality appears to be good for age and reported activity level.  Assessment/Plan:  End stage arthritis, left knee   The patient history, physical examination, clinical judgment of the provider and imaging studies are consistent with end stage degenerative joint disease of the left knee(s) and total knee arthroplasty is deemed medically necessary. The treatment options including medical management,  injection therapy arthroscopy and arthroplasty were discussed at length. The risks and benefits of total knee arthroplasty were presented and reviewed. The risks due to aseptic loosening, infection, stiffness, patella tracking problems, thromboembolic complications and other imponderables were discussed. The patient acknowledged the explanation, agreed to proceed with the plan and consent was signed. Patient is being admitted for inpatient treatment for surgery, pain control, PT, OT, prophylactic antibiotics, VTE prophylaxis, progressive ambulation and ADL's and discharge planning. The patient is planning to be discharged home with home health services   TXA  PCP: Karis Juba, PA-C Specialty: Dr. Harrell Lark, Dr. Wilhemina Bonito, Dr. Kennith Center, PA-C

## 2013-09-24 ENCOUNTER — Other Ambulatory Visit: Payer: Self-pay | Admitting: Orthopedic Surgery

## 2013-09-24 NOTE — Progress Notes (Signed)
Please note that a new updated surgical consent has been place into the Epic hospital system for Kristi Avila on 09/24/2013, 10:30 AM  by Mickel Crow for surgery on 09/28/2013.  The new consent will have a hip bursa injection added to the originally scheduled left total knee arthroplasty. Arlee Muslim, PA-C

## 2013-09-27 MED ORDER — VANCOMYCIN HCL 10 G IV SOLR
1500.0000 mg | INTRAVENOUS | Status: AC
  Administered 2013-09-28: 1500 mg via INTRAVENOUS
  Filled 2013-09-27: qty 1500

## 2013-09-27 NOTE — Anesthesia Preprocedure Evaluation (Addendum)
Anesthesia Evaluation  Patient identified by MRN, date of birth, ID band Patient awake    Reviewed: Allergy & Precautions, H&P , NPO status , Patient's Chart, lab work & pertinent test results  History of Anesthesia Complications (+) PONV, Family history of anesthesia reaction and history of anesthetic complications (family history of mother having delayed emergence)  Airway Mallampati: II TM Distance: >3 FB Neck ROM: Full    Dental no notable dental hx.    Pulmonary neg pulmonary ROS, neg shortness of breath,  breath sounds clear to auscultation  Pulmonary exam normal       Cardiovascular Exercise Tolerance: Good negative cardio ROS  Rhythm:Regular Rate:Normal  Exercises regularly using a stationary bike without chest pain or SOB   Neuro/Psych PSYCHIATRIC DISORDERS Anxiety Depression  Neuromuscular disease    GI/Hepatic negative GI ROS, Neg liver ROS, GERD-  Medicated and Controlled,  Endo/Other  negative endocrine ROS  Renal/GU negative Renal ROS Bladder dysfunction      Musculoskeletal  (+) Arthritis -, Osteoarthritis,    Abdominal   Peds negative pediatric ROS (+)  Hematology negative hematology ROS (+)   Anesthesia Other Findings   Reproductive/Obstetrics negative OB ROS                          Anesthesia Physical Anesthesia Plan  ASA: II  Anesthesia Plan: Spinal   Post-op Pain Management:    Induction:   Airway Management Planned: Nasal Cannula  Additional Equipment:   Intra-op Plan:   Post-operative Plan:   Informed Consent: I have reviewed the patients History and Physical, chart, labs and discussed the procedure including the risks, benefits and alternatives for the proposed anesthesia with the patient or authorized representative who has indicated his/her understanding and acceptance.   Dental advisory given  Plan Discussed with: CRNA  Anesthesia Plan Comments:          Anesthesia Quick Evaluation

## 2013-09-28 ENCOUNTER — Encounter (HOSPITAL_COMMUNITY)
Admission: RE | Disposition: A | Discharge status: Home Health | Payer: Self-pay | Source: Ambulatory Visit | Attending: Orthopedic Surgery

## 2013-09-28 ENCOUNTER — Encounter (HOSPITAL_COMMUNITY): Payer: BC Managed Care – PPO | Admitting: Anesthesiology

## 2013-09-28 ENCOUNTER — Encounter (HOSPITAL_COMMUNITY): Payer: Self-pay | Admitting: *Deleted

## 2013-09-28 ENCOUNTER — Inpatient Hospital Stay (HOSPITAL_COMMUNITY)
Admission: RE | Admit: 2013-09-28 | Discharge: 2013-09-30 | DRG: 470 | Disposition: A | Discharge status: Home Health | Payer: BC Managed Care – PPO | Source: Ambulatory Visit | Attending: Orthopedic Surgery | Admitting: Orthopedic Surgery

## 2013-09-28 ENCOUNTER — Inpatient Hospital Stay (HOSPITAL_COMMUNITY): Payer: BC Managed Care – PPO | Admitting: Anesthesiology

## 2013-09-28 DIAGNOSIS — M25469 Effusion, unspecified knee: Secondary | ICD-10-CM | POA: Diagnosis present

## 2013-09-28 DIAGNOSIS — F329 Major depressive disorder, single episode, unspecified: Secondary | ICD-10-CM | POA: Diagnosis present

## 2013-09-28 DIAGNOSIS — Z8711 Personal history of peptic ulcer disease: Secondary | ICD-10-CM | POA: Diagnosis not present

## 2013-09-28 DIAGNOSIS — F3289 Other specified depressive episodes: Secondary | ICD-10-CM | POA: Diagnosis present

## 2013-09-28 DIAGNOSIS — Z881 Allergy status to other antibiotic agents status: Secondary | ICD-10-CM

## 2013-09-28 DIAGNOSIS — Z888 Allergy status to other drugs, medicaments and biological substances status: Secondary | ICD-10-CM

## 2013-09-28 DIAGNOSIS — E669 Obesity, unspecified: Secondary | ICD-10-CM | POA: Diagnosis present

## 2013-09-28 DIAGNOSIS — M898X9 Other specified disorders of bone, unspecified site: Secondary | ICD-10-CM | POA: Diagnosis present

## 2013-09-28 DIAGNOSIS — Z800 Family history of malignant neoplasm of digestive organs: Secondary | ICD-10-CM

## 2013-09-28 DIAGNOSIS — E7800 Pure hypercholesterolemia, unspecified: Secondary | ICD-10-CM | POA: Diagnosis present

## 2013-09-28 DIAGNOSIS — K219 Gastro-esophageal reflux disease without esophagitis: Secondary | ICD-10-CM | POA: Diagnosis present

## 2013-09-28 DIAGNOSIS — Z833 Family history of diabetes mellitus: Secondary | ICD-10-CM

## 2013-09-28 DIAGNOSIS — Z91040 Latex allergy status: Secondary | ICD-10-CM | POA: Diagnosis not present

## 2013-09-28 DIAGNOSIS — M1990 Unspecified osteoarthritis, unspecified site: Secondary | ICD-10-CM | POA: Diagnosis present

## 2013-09-28 DIAGNOSIS — M76899 Other specified enthesopathies of unspecified lower limb, excluding foot: Secondary | ICD-10-CM | POA: Diagnosis present

## 2013-09-28 DIAGNOSIS — N318 Other neuromuscular dysfunction of bladder: Secondary | ICD-10-CM | POA: Diagnosis present

## 2013-09-28 DIAGNOSIS — Z885 Allergy status to narcotic agent status: Secondary | ICD-10-CM | POA: Diagnosis not present

## 2013-09-28 DIAGNOSIS — Z79899 Other long term (current) drug therapy: Secondary | ICD-10-CM

## 2013-09-28 DIAGNOSIS — K2270 Barrett's esophagus without dysplasia: Secondary | ICD-10-CM | POA: Diagnosis present

## 2013-09-28 DIAGNOSIS — M159 Polyosteoarthritis, unspecified: Secondary | ICD-10-CM | POA: Diagnosis present

## 2013-09-28 DIAGNOSIS — Z6836 Body mass index (BMI) 36.0-36.9, adult: Secondary | ICD-10-CM | POA: Diagnosis not present

## 2013-09-28 DIAGNOSIS — M1710 Unilateral primary osteoarthritis, unspecified knee: Principal | ICD-10-CM | POA: Diagnosis present

## 2013-09-28 DIAGNOSIS — F411 Generalized anxiety disorder: Secondary | ICD-10-CM | POA: Diagnosis present

## 2013-09-28 DIAGNOSIS — M25569 Pain in unspecified knee: Secondary | ICD-10-CM | POA: Diagnosis present

## 2013-09-28 DIAGNOSIS — M1712 Unilateral primary osteoarthritis, left knee: Secondary | ICD-10-CM

## 2013-09-28 HISTORY — PX: TOTAL KNEE ARTHROPLASTY: SHX125

## 2013-09-28 LAB — TYPE AND SCREEN
ABO/RH(D): AB POS
Antibody Screen: NEGATIVE

## 2013-09-28 SURGERY — ARTHROPLASTY, KNEE, TOTAL
Anesthesia: Spinal | Site: Knee | Laterality: Left

## 2013-09-28 MED ORDER — DEXTROSE-NACL 5-0.9 % IV SOLN
INTRAVENOUS | Status: DC
  Administered 2013-09-28 – 2013-09-29 (×2): via INTRAVENOUS

## 2013-09-28 MED ORDER — ACETAMINOPHEN 650 MG RE SUPP: 650.0000 mg | Freq: Four times a day (QID) | RECTAL | Status: DC | PRN

## 2013-09-28 MED ORDER — MENTHOL 3 MG MT LOZG
1.0000 | LOZENGE | OROMUCOSAL | Status: DC | PRN
  Filled 2013-09-28: qty 9

## 2013-09-28 MED ORDER — PROMETHAZINE HCL 25 MG/ML IJ SOLN: 6.2500 mg | INTRAMUSCULAR | Status: DC | PRN

## 2013-09-28 MED ORDER — SODIUM CHLORIDE 0.9 % IJ SOLN
INTRAMUSCULAR | Status: AC
  Filled 2013-09-28: qty 50

## 2013-09-28 MED ORDER — METOCLOPRAMIDE HCL 10 MG PO TABS
5.0000 mg | ORAL_TABLET | Freq: Three times a day (TID) | ORAL | Status: DC | PRN
  Filled 2013-09-28: qty 1

## 2013-09-28 MED ORDER — DEXAMETHASONE SODIUM PHOSPHATE 10 MG/ML IJ SOLN
10.0000 mg | Freq: Once | INTRAMUSCULAR | Status: AC
  Administered 2013-09-28: 10 mg via INTRAVENOUS

## 2013-09-28 MED ORDER — METHOCARBAMOL 1000 MG/10ML IJ SOLN
500.0000 mg | Freq: Four times a day (QID) | INTRAMUSCULAR | Status: DC | PRN
  Administered 2013-09-28: 500 mg via INTRAVENOUS
  Filled 2013-09-28: qty 5

## 2013-09-28 MED ORDER — BUPIVACAINE IN DEXTROSE 0.75-8.25 % IT SOLN
INTRATHECAL | Status: DC | PRN
  Administered 2013-09-28: 2 mL via INTRATHECAL

## 2013-09-28 MED ORDER — PROPOFOL INFUSION 10 MG/ML OPTIME
INTRAVENOUS | Status: DC | PRN
  Administered 2013-09-28: 100 ug/kg/min via INTRAVENOUS

## 2013-09-28 MED ORDER — BUPIVACAINE HCL 0.25 % IJ SOLN
INTRAMUSCULAR | Status: DC | PRN
  Administered 2013-09-28: 20 mL

## 2013-09-28 MED ORDER — METOCLOPRAMIDE HCL 5 MG/ML IJ SOLN
INTRAMUSCULAR | Status: DC | PRN
  Administered 2013-09-28: 10 mg via INTRAVENOUS

## 2013-09-28 MED ORDER — EZETIMIBE 10 MG PO TABS
10.0000 mg | ORAL_TABLET | Freq: Every evening | ORAL | Status: DC
  Administered 2013-09-28 – 2013-09-29 (×2): 10 mg via ORAL
  Filled 2013-09-28 (×3): qty 1

## 2013-09-28 MED ORDER — ONDANSETRON HCL 4 MG/2ML IJ SOLN: 4.0000 mg | Freq: Four times a day (QID) | INTRAMUSCULAR | Status: DC | PRN

## 2013-09-28 MED ORDER — OXYCODONE HCL 5 MG PO TABS
5.0000 mg | ORAL_TABLET | ORAL | Status: DC | PRN
  Administered 2013-09-28 – 2013-09-29 (×6): 10 mg via ORAL
  Filled 2013-09-28 (×6): qty 2

## 2013-09-28 MED ORDER — ACETAMINOPHEN 500 MG PO TABS
1000.0000 mg | ORAL_TABLET | Freq: Four times a day (QID) | ORAL | Status: AC
  Administered 2013-09-28 – 2013-09-29 (×4): 1000 mg via ORAL
  Filled 2013-09-28 (×5): qty 2

## 2013-09-28 MED ORDER — PROPOFOL 10 MG/ML IV BOLUS
INTRAVENOUS | Status: AC
  Filled 2013-09-28: qty 20

## 2013-09-28 MED ORDER — OXYBUTYNIN CHLORIDE ER 10 MG PO TB24
10.0000 mg | ORAL_TABLET | Freq: Every day | ORAL | Status: DC
Start: 2013-09-29 — End: 2013-09-30
  Administered 2013-09-29 – 2013-09-30 (×2): 10 mg via ORAL
  Filled 2013-09-28 (×2): qty 1

## 2013-09-28 MED ORDER — 0.9 % SODIUM CHLORIDE (POUR BTL) OPTIME
TOPICAL | Status: DC | PRN
  Administered 2013-09-28: 1000 mL

## 2013-09-28 MED ORDER — TRANEXAMIC ACID 100 MG/ML IV SOLN
1000.0000 mg | INTRAVENOUS | Status: AC
  Administered 2013-09-28: 1000 mg via INTRAVENOUS
  Filled 2013-09-28: qty 10

## 2013-09-28 MED ORDER — FLEET ENEMA 7-19 GM/118ML RE ENEM: 1.0000 | ENEMA | Freq: Once | RECTAL | Status: AC | PRN

## 2013-09-28 MED ORDER — LACTATED RINGERS IV SOLN
INTRAVENOUS | Status: DC
  Administered 2013-09-28: 11:00:00 via INTRAVENOUS
  Administered 2013-09-28: 1000 mL via INTRAVENOUS

## 2013-09-28 MED ORDER — BUPIVACAINE LIPOSOME 1.3 % IJ SUSP
INTRAMUSCULAR | Status: DC | PRN
  Administered 2013-09-28: 20 mL

## 2013-09-28 MED ORDER — MEPERIDINE HCL 50 MG/ML IJ SOLN: 6.2500 mg | INTRAMUSCULAR | Status: DC | PRN

## 2013-09-28 MED ORDER — POLYETHYLENE GLYCOL 3350 17 G PO PACK
17.0000 g | PACK | Freq: Every day | ORAL | Status: DC | PRN
  Administered 2013-09-29: 17 g via ORAL

## 2013-09-28 MED ORDER — ALPRAZOLAM 0.25 MG PO TABS
0.2500 mg | ORAL_TABLET | Freq: Every day | ORAL | Status: DC
  Administered 2013-09-28: 0.25 mg via ORAL
  Filled 2013-09-28: qty 1

## 2013-09-28 MED ORDER — DULOXETINE HCL 60 MG PO CPEP
60.0000 mg | ORAL_CAPSULE | Freq: Every morning | ORAL | Status: DC
  Administered 2013-09-29 – 2013-09-30 (×2): 60 mg via ORAL
  Filled 2013-09-28 (×2): qty 1

## 2013-09-28 MED ORDER — DIPHENHYDRAMINE HCL 12.5 MG/5ML PO ELIX: 12.5000 mg | ORAL_SOLUTION | ORAL | Status: DC | PRN

## 2013-09-28 MED ORDER — LIDOCAINE HCL (CARDIAC) 20 MG/ML IV SOLN
INTRAVENOUS | Status: DC | PRN
  Administered 2013-09-28: 50 mg via INTRAVENOUS

## 2013-09-28 MED ORDER — ACYCLOVIR 400 MG PO TABS
400.0000 mg | ORAL_TABLET | Freq: Every evening | ORAL | Status: DC
Start: 2013-09-28 — End: 2013-09-30
  Administered 2013-09-28 – 2013-09-29 (×2): 400 mg via ORAL
  Filled 2013-09-28 (×3): qty 1

## 2013-09-28 MED ORDER — ONDANSETRON HCL 4 MG PO TABS
4.0000 mg | ORAL_TABLET | Freq: Four times a day (QID) | ORAL | Status: DC | PRN
Start: 2013-09-28 — End: 2013-09-30

## 2013-09-28 MED ORDER — MIDAZOLAM HCL 2 MG/2ML IJ SOLN
INTRAMUSCULAR | Status: AC
  Filled 2013-09-28: qty 2

## 2013-09-28 MED ORDER — DOCUSATE SODIUM 100 MG PO CAPS
100.0000 mg | ORAL_CAPSULE | Freq: Two times a day (BID) | ORAL | Status: DC
  Administered 2013-09-28 – 2013-09-30 (×4): 100 mg via ORAL

## 2013-09-28 MED ORDER — DEXAMETHASONE 6 MG PO TABS
10.0000 mg | ORAL_TABLET | Freq: Every day | ORAL | Status: AC
  Administered 2013-09-29: 10 mg via ORAL
  Filled 2013-09-28: qty 1

## 2013-09-28 MED ORDER — DEXAMETHASONE SODIUM PHOSPHATE 10 MG/ML IJ SOLN
10.0000 mg | Freq: Every day | INTRAMUSCULAR | Status: AC
  Filled 2013-09-28: qty 1

## 2013-09-28 MED ORDER — METHYLPREDNISOLONE ACETATE 40 MG/ML IJ SUSP
INTRAMUSCULAR | Status: AC
  Filled 2013-09-28: qty 2

## 2013-09-28 MED ORDER — ACETAMINOPHEN 325 MG PO TABS: 650.0000 mg | ORAL_TABLET | Freq: Four times a day (QID) | ORAL | Status: DC | PRN

## 2013-09-28 MED ORDER — ONDANSETRON HCL 4 MG/2ML IJ SOLN
INTRAMUSCULAR | Status: DC | PRN
  Administered 2013-09-28: 4 mg via INTRAVENOUS

## 2013-09-28 MED ORDER — FENTANYL CITRATE 0.05 MG/ML IJ SOLN
25.0000 ug | INTRAMUSCULAR | Status: DC | PRN
  Administered 2013-09-28 (×2): 50 ug via INTRAVENOUS

## 2013-09-28 MED ORDER — SODIUM CHLORIDE 0.9 % IJ SOLN
INTRAMUSCULAR | Status: DC | PRN
  Administered 2013-09-28: 30 mL

## 2013-09-28 MED ORDER — BUPIVACAINE HCL (PF) 0.25 % IJ SOLN
INTRAMUSCULAR | Status: AC
  Filled 2013-09-28: qty 30

## 2013-09-28 MED ORDER — BUPIVACAINE LIPOSOME 1.3 % IJ SUSP
20.0000 mL | Freq: Once | INTRAMUSCULAR | Status: DC
  Filled 2013-09-28: qty 20

## 2013-09-28 MED ORDER — FLUTICASONE PROPIONATE 50 MCG/ACT NA SUSP
1.0000 | Freq: Every day | NASAL | Status: DC
  Administered 2013-09-29 – 2013-09-30 (×2): 1 via NASAL
  Filled 2013-09-28: qty 16

## 2013-09-28 MED ORDER — PANTOPRAZOLE SODIUM 40 MG PO TBEC: 80.0000 mg | DELAYED_RELEASE_TABLET | Freq: Every day | ORAL | Status: DC

## 2013-09-28 MED ORDER — SODIUM CHLORIDE 0.9 % IV SOLN: INTRAVENOUS | Status: DC

## 2013-09-28 MED ORDER — METHYLPREDNISOLONE ACETATE 40 MG/ML IJ SUSP
INTRAMUSCULAR | Status: DC | PRN
  Administered 2013-09-28: 80 mg

## 2013-09-28 MED ORDER — VANCOMYCIN HCL IN DEXTROSE 1-5 GM/200ML-% IV SOLN
1000.0000 mg | Freq: Two times a day (BID) | INTRAVENOUS | Status: AC
  Administered 2013-09-28: 1000 mg via INTRAVENOUS
  Filled 2013-09-28: qty 200

## 2013-09-28 MED ORDER — SODIUM CHLORIDE 0.9 % IR SOLN
Status: DC | PRN
  Administered 2013-09-28: 1000 mL

## 2013-09-28 MED ORDER — PHENOL 1.4 % MT LIQD
1.0000 | OROMUCOSAL | Status: DC | PRN
  Filled 2013-09-28: qty 177

## 2013-09-28 MED ORDER — MIDAZOLAM HCL 5 MG/5ML IJ SOLN
INTRAMUSCULAR | Status: DC | PRN
  Administered 2013-09-28: 2 mg via INTRAVENOUS
  Administered 2013-09-28: 0.5 mg via INTRAVENOUS
  Administered 2013-09-28: 1 mg via INTRAVENOUS
  Administered 2013-09-28: 0.5 mg via INTRAVENOUS

## 2013-09-28 MED ORDER — RIVAROXABAN 10 MG PO TABS
10.0000 mg | ORAL_TABLET | Freq: Every day | ORAL | Status: DC
  Administered 2013-09-29 – 2013-09-30 (×2): 10 mg via ORAL
  Filled 2013-09-28 (×3): qty 1

## 2013-09-28 MED ORDER — ACETAMINOPHEN 10 MG/ML IV SOLN
1000.0000 mg | Freq: Once | INTRAVENOUS | Status: AC
  Administered 2013-09-28: 1000 mg via INTRAVENOUS
  Filled 2013-09-28: qty 100

## 2013-09-28 MED ORDER — LIDOCAINE HCL (CARDIAC) 20 MG/ML IV SOLN
INTRAVENOUS | Status: AC
  Filled 2013-09-28: qty 5

## 2013-09-28 MED ORDER — KETOROLAC TROMETHAMINE 15 MG/ML IJ SOLN
7.5000 mg | Freq: Four times a day (QID) | INTRAMUSCULAR | Status: AC | PRN
  Administered 2013-09-28 – 2013-09-29 (×2): 7.5 mg via INTRAVENOUS
  Filled 2013-09-28 (×3): qty 1

## 2013-09-28 MED ORDER — FENTANYL CITRATE 0.05 MG/ML IJ SOLN
INTRAMUSCULAR | Status: AC
  Filled 2013-09-28: qty 2

## 2013-09-28 MED ORDER — METHOCARBAMOL 500 MG PO TABS
500.0000 mg | ORAL_TABLET | Freq: Four times a day (QID) | ORAL | Status: DC | PRN
  Administered 2013-09-28 – 2013-09-30 (×8): 500 mg via ORAL
  Filled 2013-09-28 (×8): qty 1

## 2013-09-28 MED ORDER — METOCLOPRAMIDE HCL 10 MG PO TABS
10.0000 mg | ORAL_TABLET | Freq: Two times a day (BID) | ORAL | Status: DC
  Administered 2013-09-28 – 2013-09-30 (×5): 10 mg via ORAL
  Filled 2013-09-28 (×5): qty 1

## 2013-09-28 MED ORDER — BISACODYL 10 MG RE SUPP: 10.0000 mg | Freq: Every day | RECTAL | Status: DC | PRN

## 2013-09-28 MED ORDER — MORPHINE SULFATE 2 MG/ML IJ SOLN
1.0000 mg | INTRAMUSCULAR | Status: DC | PRN
  Administered 2013-09-28 – 2013-09-29 (×7): 2 mg via INTRAVENOUS
  Filled 2013-09-28 (×7): qty 1

## 2013-09-28 MED ORDER — DEXAMETHASONE SODIUM PHOSPHATE 10 MG/ML IJ SOLN
INTRAMUSCULAR | Status: AC
  Filled 2013-09-28: qty 1

## 2013-09-28 MED ORDER — FENTANYL CITRATE 0.05 MG/ML IJ SOLN
INTRAMUSCULAR | Status: DC | PRN
  Administered 2013-09-28 (×2): 50 ug via INTRAVENOUS

## 2013-09-28 MED ORDER — METOCLOPRAMIDE HCL 5 MG/ML IJ SOLN: 5.0000 mg | Freq: Three times a day (TID) | INTRAMUSCULAR | Status: DC | PRN

## 2013-09-28 MED ORDER — CHLORHEXIDINE GLUCONATE 4 % EX LIQD: 60.0000 mL | Freq: Once | CUTANEOUS | Status: DC

## 2013-09-28 SURGICAL SUPPLY — 64 items
BAG SPEC THK2 15X12 ZIP CLS (MISCELLANEOUS)
BAG ZIPLOCK 12X15 (MISCELLANEOUS) ×1 IMPLANT
BANDAGE ELASTIC 6 VELCRO ST LF (GAUZE/BANDAGES/DRESSINGS) ×2 IMPLANT
BANDAGE ESMARK 6X9 LF (GAUZE/BANDAGES/DRESSINGS) ×1 IMPLANT
BLADE SAG 18X100X1.27 (BLADE) ×2 IMPLANT
BLADE SAW SGTL 11.0X1.19X90.0M (BLADE) ×2 IMPLANT
BNDG CMPR 9X6 STRL LF SNTH (GAUZE/BANDAGES/DRESSINGS) ×1
BNDG ESMARK 6X9 LF (GAUZE/BANDAGES/DRESSINGS) ×2
BOWL SMART MIX CTS (DISPOSABLE) ×2 IMPLANT
CAP KNEE ATTUNE RP ×1 IMPLANT
CATH FOLEY LATEX FREE 16FR (CATHETERS) ×1 IMPLANT
CEMENT HV SMART SET (Cement) ×4 IMPLANT
COVER SURGICAL LIGHT HANDLE (MISCELLANEOUS) ×1 IMPLANT
CUFF TOURN SGL QUICK 34 (TOURNIQUET CUFF) ×2
CUFF TRNQT CYL 34X4X40X1 (TOURNIQUET CUFF) ×1 IMPLANT
DECANTER SPIKE VIAL GLASS SM (MISCELLANEOUS) ×2 IMPLANT
DRAPE EXTREMITY T 121X128X90 (DRAPE) ×2 IMPLANT
DRAPE POUCH INSTRU U-SHP 10X18 (DRAPES) ×2 IMPLANT
DRAPE U-SHAPE 47X51 STRL (DRAPES) ×2 IMPLANT
DRSG ADAPTIC 3X8 NADH LF (GAUZE/BANDAGES/DRESSINGS) ×2 IMPLANT
DRSG PAD ABDOMINAL 8X10 ST (GAUZE/BANDAGES/DRESSINGS) ×2 IMPLANT
DURAPREP 26ML APPLICATOR (WOUND CARE) ×2 IMPLANT
ELECT REM PT RETURN 9FT ADLT (ELECTROSURGICAL) ×2
ELECTRODE REM PT RTRN 9FT ADLT (ELECTROSURGICAL) ×1 IMPLANT
EVACUATOR 1/8 PVC DRAIN (DRAIN) ×2 IMPLANT
FACESHIELD WRAPAROUND (MASK) ×10 IMPLANT
FACESHIELD WRAPAROUND OR TEAM (MASK) ×5 IMPLANT
GAUZE SPONGE 4X4 12PLY STRL (GAUZE/BANDAGES/DRESSINGS) ×2 IMPLANT
GLOVE BIOGEL PI IND STRL 6.5 (GLOVE) IMPLANT
GLOVE BIOGEL PI IND STRL 7.0 (GLOVE) IMPLANT
GLOVE BIOGEL PI IND STRL 8 (GLOVE) ×1 IMPLANT
GLOVE BIOGEL PI INDICATOR 6.5 (GLOVE) ×1
GLOVE BIOGEL PI INDICATOR 7.0 (GLOVE) ×1
GLOVE BIOGEL PI INDICATOR 8 (GLOVE) ×2
GLOVE SURG SS PI 6.5 STRL IVOR (GLOVE) ×2 IMPLANT
GLOVE SURG SS PI 7.0 STRL IVOR (GLOVE) ×2 IMPLANT
GLOVE SURG SS PI 7.5 STRL IVOR (GLOVE) ×1 IMPLANT
GLOVE SURG SS PI 8.0 STRL IVOR (GLOVE) ×1 IMPLANT
GOWN STRL REUS W/TWL LRG LVL3 (GOWN DISPOSABLE) ×3 IMPLANT
GOWN STRL REUS W/TWL XL LVL3 (GOWN DISPOSABLE) ×2 IMPLANT
HANDPIECE INTERPULSE COAX TIP (DISPOSABLE) ×2
IMMOBILIZER KNEE 20 (SOFTGOODS) ×2
IMMOBILIZER KNEE 20 THIGH 36 (SOFTGOODS) ×1 IMPLANT
KIT BASIN OR (CUSTOM PROCEDURE TRAY) ×2 IMPLANT
MANIFOLD NEPTUNE II (INSTRUMENTS) ×2 IMPLANT
NDL SAFETY ECLIPSE 18X1.5 (NEEDLE) ×2 IMPLANT
NEEDLE HYPO 18GX1.5 SHARP (NEEDLE) ×4
NS IRRIG 1000ML POUR BTL (IV SOLUTION) ×2 IMPLANT
PACK TOTAL JOINT (CUSTOM PROCEDURE TRAY) ×2 IMPLANT
PADDING CAST COTTON 6X4 STRL (CAST SUPPLIES) ×5 IMPLANT
POSITIONER SURGICAL ARM (MISCELLANEOUS) ×2 IMPLANT
SET HNDPC FAN SPRY TIP SCT (DISPOSABLE) ×1 IMPLANT
STRIP CLOSURE SKIN 1/2X4 (GAUZE/BANDAGES/DRESSINGS) ×4 IMPLANT
SUCTION FRAZIER 12FR DISP (SUCTIONS) ×2 IMPLANT
SUT MNCRL AB 4-0 PS2 18 (SUTURE) ×2 IMPLANT
SUT VIC AB 2-0 CT1 27 (SUTURE) ×6
SUT VIC AB 2-0 CT1 TAPERPNT 27 (SUTURE) ×3 IMPLANT
SUT VLOC 180 0 24IN GS25 (SUTURE) ×2 IMPLANT
SYR 20CC LL (SYRINGE) ×2 IMPLANT
SYR 50ML LL SCALE MARK (SYRINGE) ×2 IMPLANT
TOWEL OR 17X26 10 PK STRL BLUE (TOWEL DISPOSABLE) ×2 IMPLANT
TOWEL OR NON WOVEN STRL DISP B (DISPOSABLE) ×1 IMPLANT
WATER STERILE IRR 1500ML POUR (IV SOLUTION) ×2 IMPLANT
WRAP KNEE MAXI GEL POST OP (GAUZE/BANDAGES/DRESSINGS) ×2 IMPLANT

## 2013-09-28 NOTE — Op Note (Signed)
Pre-operative diagnosis- Osteoarthritis  Left knee(s)     2.- Right hip trochanteric bursitis  Post-operative diagnosis- Osteoarthritis Left knee(s)     2- Right hip trochanteric bursitis Procedure-  Left  Total Knee Arthroplasty   2- Right hip cortisone injection  Surgeon- Kristi Plover. Laiba Fuerte, MD  Assistant- Arlee Muslim, PA-C   Anesthesia-  Spinal  EBL-* No blood loss amount entered *   Drains Hemovac  Tourniquet time-  Total Tourniquet Time Documented: Thigh (Left) - 37 minutes Total: Thigh (Left) - 37 minutes     Complications- None  Condition-PACU - hemodynamically stable.   Brief Clinical Note  Kristi Avila is a 58 y.o. year old female with end stage OA of her left knee with progressively worsening pain and dysfunction. She has constant pain, with activity and at rest and significant functional deficits with difficulties even with ADLs. She has had extensive non-op management including analgesics, injections of cortisone and viscosupplements, and home exercise program, but remains in significant pain with significant dysfunction. Radiographs show bone on bone arthritis lateral and patellofemoral. She presents now for left Total Knee Arthroplasty.    Procedure in detail---   The patient is brought into the operating room and positioned supine on the operating table. After successful administration of  Spinal,   a tourniquet is placed high on the  Left thigh(s) and the lower extremity is prepped and draped in the usual sterile fashion. Time out is performed by the operating team and then the  Left lower extremity is wrapped in Esmarch, knee flexed and the tourniquet inflated to 300 mmHg.       A midline incision is made with a ten blade through the subcutaneous tissue to the level of the extensor mechanism. A fresh blade is used to make a medial parapatellar arthrotomy. Soft tissue over the proximal medial tibia is subperiosteally elevated to the joint line with a knife and into  the semimembranosus bursa with a Cobb elevator. Soft tissue over the proximal lateral tibia is elevated with attention being paid to avoiding the patellar tendon on the tibial tubercle. The patella is everted, knee flexed 90 degrees and the ACL and PCL are removed. Findings are bone on bone lateral and patellofemoral.        The drill is used to create a starting hole in the distal femur and the canal is thoroughly irrigated with sterile saline to remove the fatty contents. The 5 degree Left  valgus alignment guide is placed into the femoral canal and the distal femoral cutting block is pinned to remove 9 mm off the distal femur. Resection is made with an oscillating saw.      The tibia is subluxed forward and the menisci are removed. The extramedullary alignment guide is placed referencing proximally at the medial aspect of the tibial tubercle and distally along the second metatarsal axis and tibial crest. The block is pinned to remove 38mm off the more deficient lateral  side. Resection is made with an oscillating saw. Size 6is the most appropriate size for the tibia and the proximal tibia is prepared with the modular drill and keel punch for that size.      The femoral sizing guide is placed and size 6 is most appropriate. Rotation is marked off the epicondylar axis and confirmed by creating a rectangular flexion gap at 90 degrees. The size 6 cutting block is pinned in this rotation and the anterior, posterior and chamfer cuts are made with the oscillating saw. The  intercondylar block is then placed and that cut is made.      Trial size 6 tibial component, trial size 6 posterior stabilized femur and a 6  mm posterior stabilized rotating platform insert trial is placed. Full extension is achieved with excellent varus/valgus and anterior/posterior balance throughout full range of motion. The patella is everted and thickness measured to be 24  mm. Free hand resection is taken to 14 mm, a 38 template is placed,  lug holes are drilled, trial patella is placed, and it tracks normally. Osteophytes are removed off the posterior femur with the trial in place. All trials are removed and the cut bone surfaces prepared with pulsatile lavage. Cement is mixed and once ready for implantation, the size 6 tibial implant, size  6 posterior stabilized femoral component, and the size 38 patella are cemented in place and the patella is held with the clamp. The trial insert is placed and the knee held in full extension. The Exparel (20 ml mixed with 30 ml saline) and .25% Bupivicaine, are injected into the extensor mechanism, posterior capsule, medial and lateral gutters and subcutaneous tissues.  All extruded cement is removed and once the cement is hard the permanent 6 mm posterior stabilized rotating platform insert is placed into the tibial tray.      The wound is copiously irrigated with saline solution and the extensor mechanism closed over a hemovac drain with #1 V-loc suture. The tourniquet is released for a total tourniquet time of 37  minutes. Flexion against gravity is 140 degrees and the patella tracks normally. Subcutaneous tissue is closed with 2.0 vicryl and subcuticular with running 4.0 Monocryl. The incision is cleaned and dried and steri-strips and a bulky sterile dressing are applied. The limb is placed into a knee immobilizer.      I then prepped the lateral aspect of the right hip with Betadine and inserted a spinal needle to the level of the greater trochanter and injected with 80 mg (2 ml) of Depomedrol. The patient is then awakened and transported to recovery in stable condition.      Please note that a surgical assistant was a medical necessity for this procedure in order to perform it in a safe and expeditious manner. Surgical assistant was necessary to retract the ligaments and vital neurovascular structures to prevent injury to them and also necessary for proper positioning of the limb to allow for anatomic  placement of the prosthesis.   Kristi Plover Rashon Westrup, MD    09/28/2013, 10:59 AM

## 2013-09-28 NOTE — Anesthesia Postprocedure Evaluation (Signed)
  Anesthesia Post-op Note  Patient: Kristi Avila  Procedure(s) Performed: Procedure(s) (LRB): LEFT TOTAL KNEE ARTHROPLASTY, CORTISONE INJECTION RIGHT HIP (Left)  Patient Location: PACU  Anesthesia Type: Spinal  Level of Consciousness: awake and alert   Airway and Oxygen Therapy: Patient Spontanous Breathing  Post-op Pain: mild  Post-op Assessment: Post-op Vital signs reviewed, Patient's Cardiovascular Status Stable, Respiratory Function Stable, Patent Airway and No signs of Nausea or vomiting  Last Vitals:  Filed Vitals:   09/28/13 1155  BP:   Pulse: 53  Temp:   Resp: 12    Post-op Vital Signs: stable   Complications: No apparent anesthesia complications

## 2013-09-28 NOTE — Interval H&P Note (Signed)
History and Physical Interval Note:  09/28/2013 7:01 AM  Kristi Avila  has presented today for surgery, with the diagnosis of left knee osteoarthritis  The various methods of treatment have been discussed with the patient and family. After consideration of risks, benefits and other options for treatment, the patient has consented to  Procedure(s): LEFT TOTAL KNEE ARTHROPLASTY (Left) as a surgical intervention .  The patient's history has been reviewed, patient examined, no change in status, stable for surgery.  I have reviewed the patient's chart and labs.  Questions were answered to the patient's satisfaction.     [name]

## 2013-09-28 NOTE — Transfer of Care (Signed)
Immediate Anesthesia Transfer of Care Note  Patient: Kristi Avila  Procedure(s) Performed: Procedure(s) (LRB): LEFT TOTAL KNEE ARTHROPLASTY, CORTISONE INJECTION RIGHT HIP (Left)  Patient Location: PACU  Anesthesia Type: Spinal  Level of Consciousness: sedated, patient cooperative and responds to stimulation  Airway & Oxygen Therapy: Patient Spontanous Breathing and Patient connected to face mask oxgen  Post-op Assessment: Report given to PACU RN and Post -op Vital signs reviewed and stable  Post vital signs: Reviewed and stable  Complications: No apparent anesthesia complications Noted M-54 level on exam, denied pain on assessment slight movement to left foot.

## 2013-09-28 NOTE — Anesthesia Postprocedure Evaluation (Signed)
  Anesthesia Post-op Note  Patient: Kristi Avila  Procedure(s) Performed: Procedure(s) (LRB): LEFT TOTAL KNEE ARTHROPLASTY, CORTISONE INJECTION RIGHT HIP (Left)  Patient Location: PACU  Anesthesia Type: Spinal  Level of Consciousness: awake and alert   Airway and Oxygen Therapy: Patient Spontanous Breathing  Post-op Pain: mild  Post-op Assessment: Post-op Vital signs reviewed, Patient's Cardiovascular Status Stable, Respiratory Function Stable, Patent Airway and No signs of Nausea or vomiting  Last Vitals:  Filed Vitals:   09/28/13 0622  BP: 118/61  Pulse: 80  Temp: 36.4 C  Resp: 18    Post-op Vital Signs: stable   Complications: No apparent anesthesia complications

## 2013-09-28 NOTE — Progress Notes (Signed)
Utilization review completed.  

## 2013-09-28 NOTE — Anesthesia Procedure Notes (Addendum)
Spinal  Patient location during procedure: OR Start time: 09/28/2013 9:45 AM Staffing Anesthesiologist: Milana Obey CRNA/Resident: Sherian Maroon A Performed by: anesthesiologist and resident/CRNA  Preanesthetic Checklist Completed: patient identified, site marked, surgical consent, pre-op evaluation, timeout performed, IV checked, risks and benefits discussed and monitors and equipment checked Spinal Block Patient position: sitting Prep: Betadine Patient monitoring: continuous pulse ox, blood pressure and heart rate Approach: midline Location: L3-4 Injection technique: single-shot Needle Needle type: Sprotte  Needle gauge: 24 G Needle length: 9 cm Additional Notes Functioning IV was confirmed and monitors were applied. Sterile prep and drape, including hand hygiene and sterile gloves were used. The patient was positioned and the spine was prepped. The skin was anesthetized with lidocaine.  Free flow of clear CSF was obtained prior to injecting local anesthetic into the CSF.  The spinal needle aspirated freely following injection.  The needle was carefully withdrawn.  The patient tolerated the procedure well.  Lauretta Grill, MD

## 2013-09-28 NOTE — Evaluation (Signed)
Physical Therapy Evaluation Patient Details Name: Kristi Avila MRN: [id number] DOB: Sep 06, 1955 Today's Date: 09/28/2013   History of Present Illness  LTKA  Clinical Impression  Patient tolerated ambulation well. Plans Dc to home. Pt will benefit from PT to address problems listed in note below.    Follow Up Recommendations Home health PT;Supervision/Assistance - 24 hour    Equipment Recommendations  None recommended by PT    Recommendations for Other Services       Precautions / Restrictions Precautions Precautions: Knee Required Braces or Orthoses: Knee Immobilizer - Left Knee Immobilizer - Left: Discontinue once straight leg raise with < 10 degree lag Restrictions LLE Weight Bearing: Weight bearing as tolerated      Mobility  Bed Mobility Overal bed mobility: Needs Assistance Bed Mobility: Supine to Sit     Supine to sit: Min assist     General bed mobility comments: support L leg t floor.  Transfers Overall transfer level: Needs assistance Equipment used: Rolling walker (2 wheeled) Transfers: Sit to/from Stand Sit to Stand: Min assist         General transfer comment: cues for hand placement and LLe  Ambulation/Gait Ambulation/Gait assistance: Min assist Ambulation Distance (Feet): 50 Feet Assistive device: Rolling walker (2 wheeled) Gait Pattern/deviations: Step-through pattern;Step-to pattern;Decreased stance time - left     General Gait Details: pt ambulated very well, cues for sequence and posture  Stairs            Wheelchair Mobility    Modified Rankin (Stroke Patients Only)       Balance                                             Pertinent Vitals/Pain Pain Assessment: 0-10 Pain Score: 4  Pain Descriptors / Indicators: Aching Pain Intervention(s): Premedicated before session;Repositioned;Ice applied    Home Living Family/patient expects to be discharged to:: Private residence Living Arrangements:  Spouse/significant other Available Help at Discharge: Family Type of Home: House Home Access: Stairs to enter   Technical brewer of Steps: 2   Home Equipment: Environmental consultant - 2 wheels      Prior Function Level of Independence: Independent               Hand Dominance        Extremity/Trunk Assessment   Upper Extremity Assessment: Overall WFL for tasks assessed           Lower Extremity Assessment: LLE deficits/detail   LLE Deficits / Details: able to perform a slr.     Communication   Communication: No difficulties  Cognition Arousal/Alertness: Awake/alert Behavior During Therapy: WFL for tasks assessed/performed Overall Cognitive Status: Within Functional Limits for tasks assessed                      General Comments      Exercises        Assessment/Plan    PT Assessment Patient needs continued PT services  PT Diagnosis Difficulty walking   PT Problem List Decreased strength;Decreased range of motion;Decreased activity tolerance;Decreased mobility;Decreased knowledge of precautions;Decreased safety awareness;Decreased knowledge of use of DME  PT Treatment Interventions DME instruction;Gait training;Stair training;Functional mobility training;Therapeutic activities;Therapeutic exercise;Patient/family education   PT Goals (Current goals can be found in the Care Plan section) Acute Rehab PT Goals Patient Stated Goal: to walk without pain PT Goal Formulation:  With patient/family Time For Goal Achievement: 10/02/13 Potential to Achieve Goals: Good    Frequency 7X/week   Barriers to discharge        Co-evaluation               End of Session Equipment Utilized During Treatment: Left knee immobilizer Activity Tolerance: Patient tolerated treatment well Patient left: in chair;with call bell/phone within reach;with family/visitor present Nurse Communication: Mobility status         Time: 6754-4920 PT Time Calculation (min): 22  min   Charges:   PT Evaluation $Initial PT Evaluation Tier I: 1 Procedure PT Treatments $Gait Training: 8-22 mins   PT G Codes:          Claretha Cooper 09/28/2013, 6:03 PM Tresa Endo PT [phone]

## 2013-09-29 ENCOUNTER — Encounter (HOSPITAL_COMMUNITY): Payer: Self-pay | Admitting: Orthopedic Surgery

## 2013-09-29 LAB — BASIC METABOLIC PANEL
Anion gap: 9 (ref 5–15)
BUN: 11 mg/dL (ref 6–23)
CHLORIDE: 104 meq/L (ref 96–112)
CO2: 27 mEq/L (ref 19–32)
Calcium: 8.9 mg/dL (ref 8.4–10.5)
Creatinine, Ser: 0.79 mg/dL (ref 0.50–1.10)
GFR calc Af Amer: >90 mL/min (ref >90)
GFR calc non Af Amer: >90 mL/min (ref >90)
Glucose, Bld: 98 mg/dL (ref 70–99)
Potassium: 4.2 mEq/L (ref 3.7–5.3)
Sodium: 140 mEq/L (ref 137–147)

## 2013-09-29 LAB — CBC
HEMATOCRIT: 33.4 % — AB (ref 36.0–46.0)
Hemoglobin: 10.9 g/dL — ABNORMAL LOW (ref 12.0–15.0)
MCH: 29.9 pg (ref 26.0–34.0)
MCHC: 32.6 g/dL (ref 30.0–36.0)
MCV: 91.5 fL (ref 78.0–100.0)
Platelets: 269 10*3/uL (ref 150–400)
RBC: 3.65 MIL/uL — AB (ref 3.87–5.11)
RDW: 12.5 % (ref 11.5–15.5)
WBC: 10.7 10*3/uL — AB (ref 4.0–10.5)

## 2013-09-29 MED ORDER — MIRABEGRON ER 25 MG PO TB24
25.0000 mg | ORAL_TABLET | Freq: Every morning | ORAL | Status: DC
  Administered 2013-09-29 – 2013-09-30 (×2): 25 mg via ORAL
  Filled 2013-09-29 (×2): qty 1

## 2013-09-29 MED ORDER — NON FORMULARY: 40.0000 mg | Freq: Every day | Status: DC

## 2013-09-29 MED ORDER — HYDROMORPHONE HCL 2 MG PO TABS
2.0000 mg | ORAL_TABLET | ORAL | Status: DC | PRN
  Administered 2013-09-29 – 2013-09-30 (×8): 4 mg via ORAL
  Filled 2013-09-29: qty 1
  Filled 2013-09-29 (×8): qty 2

## 2013-09-29 MED ORDER — OMEPRAZOLE 20 MG PO CPDR
40.0000 mg | DELAYED_RELEASE_CAPSULE | ORAL | Status: DC
  Administered 2013-09-29: 40 mg via ORAL
  Filled 2013-09-29 (×2): qty 2

## 2013-09-29 MED ORDER — ALPRAZOLAM 0.25 MG PO TABS
0.2500 mg | ORAL_TABLET | ORAL | Status: DC
  Administered 2013-09-29: 0.25 mg via ORAL
  Filled 2013-09-29: qty 1

## 2013-09-29 MED ORDER — OMEPRAZOLE 20 MG PO CPDR
40.0000 mg | DELAYED_RELEASE_CAPSULE | Freq: Every day | ORAL | Status: DC
  Filled 2013-09-29: qty 2

## 2013-09-29 NOTE — Progress Notes (Signed)
   Subjective: 1 Day Post-Op Procedure(s) (LRB): LEFT TOTAL KNEE ARTHROPLASTY, CORTISONE INJECTION RIGHT HIP (Left) Patient reports pain as mild.   Patient seen in rounds with Dr. Wynelle Link. Patient is well, but has had some minor complaints of pain in the knee, requiring pain medications We will resume therapy today. She got up and walked 50 feet yesterday. Plan is to go Home after hospital stay.  Objective: Vital signs in last 24 hours: Temp:  [97.5 F (36.4 C)-98.7 F (37.1 C)] 98.7 F (37.1 C) (09/15 0445) Pulse Rate:  [52-82] 60 (09/15 0445) Resp:  [12-18] 16 (09/15 0710) BP: (102-124)/(51-75) 102/65 mmHg (09/15 0445) SpO2:  [92 %-100 %] 97 % (09/15 0710)  Intake/Output from previous day:  Intake/Output Summary (Last 24 hours) at 09/29/13 0843 Last data filed at 09/29/13 0600  Gross per 24 hour  Intake   3929 ml  Output   4070 ml  Net   -141 ml    Labs:  Recent Labs  09/29/13 0431  HGB 10.9*    Recent Labs  09/29/13 0431  WBC 10.7*  RBC 3.65*  HCT 33.4*  PLT 269    Recent Labs  09/29/13 0431  NA 140  K 4.2  CL 104  CO2 27  BUN 11  CREATININE 0.79  GLUCOSE 98  CALCIUM 8.9   No results found for this basename: LABPT, INR,  in the last 72 hours  EXAM General - Patient is Alert, Appropriate and Oriented Extremity - Neurovascular intact Sensation intact distally Dressing - dressing C/D/I Motor Function - intact, moving foot and toes well on exam.  Hemovac pulled without difficulty.  Past Medical History  Diagnosis Date  . Reflux   . High cholesterol   . Overactive bladder   . Obesity   . Anxiety   . Depression   . Arthritis   . HSV-2 infection     anal  . GERD (gastroesophageal reflux disease)     h/o peptic ulcer  . Frequency of urination   . Urgency of urination   . Nocturia   . Barrett's esophagus with esophagitis 07/23/12    EGD: CLO test results negative. pathology: [facility]: Repeat EGD 3 years  . PONV (postoperative nausea and  vomiting) 09-22-12    felt weak for 6 months after surgery, had spinal  . Family history of anesthesia complication     mother trouble waking up  . Shortness of breath     last 6 months    Assessment/Plan: 1 Day Post-Op Procedure(s) (LRB): LEFT TOTAL KNEE ARTHROPLASTY, CORTISONE INJECTION RIGHT HIP (Left) Active Problems:   OA (osteoarthritis) of knee  Estimated body mass index is 36.01 kg/(m^2) as calculated from the following:   Height as of this encounter: 5\' 6"  (1.676 m).   Weight as of this encounter: 101.152 kg (223 lb). Advance diet Up with therapy Plan for discharge tomorrow Discharge home with home health  DVT Prophylaxis - Xarelto Weight-Bearing as tolerated to left leg D/C O2 and Pulse OX and try on Room Air Change Oxycodone to Dilaudid tabs  Arlee Muslim, PA-C Orthopaedic Surgery 09/29/2013, 8:43 AM

## 2013-09-29 NOTE — Progress Notes (Signed)
Physical Therapy Treatment Patient Details Name: Kristi Avila MRN: [id number] DOB: [birth year] Today's Date: 09/29/2013    History of Present Illness LTKA    PT Comments    POD # 1 pm session.  Applied KI and asssited OOB to amb in hallway then return to bed to perform TE's followed by ice.  Pt progressing well and plans to D/C to home tomorrow.   Follow Up Recommendations  Home health PT;Supervision/Assistance - 24 hour     Equipment Recommendations  None recommended by PT    Recommendations for Other Services       Precautions / Restrictions Precautions Precautions: Knee Precaution Comments: instructed pt on KI use for amb  Required Braces or Orthoses: Knee Immobilizer - Left Restrictions Weight Bearing Restrictions: No LLE Weight Bearing: Weight bearing as tolerated    Mobility  Bed Mobility Overal bed mobility: Needs Assistance Bed Mobility: Sit to Supine;Supine to Sit     Supine to sit: Min guard Sit to supine: Min guard   General bed mobility comments: support L leg t floor plus increased time to get OOB and back into bed  Transfers Overall transfer level: Needs assistance Equipment used: Rolling walker (2 wheeled) Transfers: Sit to/from Stand Sit to Stand: Min assist         General transfer comment: 50% VC's on proper tech and hand placement  Ambulation/Gait Ambulation/Gait assistance: Min assist Ambulation Distance (Feet): 55 Feet Assistive device: Rolling walker (2 wheeled) Gait Pattern/deviations: Step-to pattern;Decreased stance time - left Gait velocity: decreased   General Gait Details: <25% VC's on proper sequencing and upright posture.  Required increased time.   Stairs            Wheelchair Mobility    Modified Rankin (Stroke Patients Only)       Balance                                    Cognition                            Exercises   Total Knee Replacement TE's 10 reps B LE ankle  pumps 10 reps towel squeezes 10 reps knee presses 10 reps heel slides  10 reps SAQ's 10 reps SLR's 10 reps ABD Followed by ICE     General Comments        Pertinent Vitals/Pain Pain Assessment: 0-10 Pain Score: 7  Pain Descriptors / Indicators: Aching Pain Intervention(s): Premedicated before session;Repositioned;Ice applied    Home Living                      Prior Function            PT Goals (current goals can now be found in the care plan section) Progress towards PT goals: Progressing toward goals    Frequency  7X/week    PT Plan      Co-evaluation             End of Session Equipment Utilized During Treatment: Left knee immobilizer Activity Tolerance: Patient tolerated treatment well Patient left: in chair;with call bell/phone within reach;with family/visitor present     Time: 8841-6606 PT Time Calculation (min): 23 min  Charges:  $Gait Training: 8-22 mins $Therapeutic Exercise: 8-22 mins  G Codes:      Rica Koyanagi  PTA [facility]  Acute  Rehab Pager      [phone]

## 2013-09-29 NOTE — Plan of Care (Signed)
Problem: Phase III Progression Outcomes Goal: Anticoagulant follow-up in place Outcome: Not Applicable Date Met:  09/29/13 Xarelto VTE, no f/u needed.     

## 2013-09-29 NOTE — Progress Notes (Signed)
Physical Therapy Treatment Patient Details Name: Kristi Avila MRN: [id number] DOB: Mar 24, 1955 Today's Date: 09/29/2013    History of Present Illness LTKA    PT Comments    POD # 1 am session.  Applied KI and instructed on use.  Assisted OOB to amb limited distance in hallway then returned to room to perform TKR TE's followed by ICE.  Follow Up Recommendations  Home health PT;Supervision/Assistance - 24 hour     Equipment Recommendations  None recommended by PT    Recommendations for Other Services       Precautions / Restrictions Precautions Precautions: Knee Precaution Comments: instructed pt on KI use for amb  Required Braces or Orthoses: Knee Immobilizer - Left Restrictions Weight Bearing Restrictions: No LLE Weight Bearing: Weight bearing as tolerated    Mobility  Bed Mobility Overal bed mobility: Needs Assistance Bed Mobility: Supine to Sit     Supine to sit: Min guard     General bed mobility comments: support L leg t floor plus increased time  Transfers Overall transfer level: Needs assistance Equipment used: Rolling walker (2 wheeled) Transfers: Sit to/from Stand Sit to Stand: Min assist         General transfer comment: 50% VC's on proper tech and hand placement  Ambulation/Gait Ambulation/Gait assistance: Min assist Ambulation Distance (Feet): 55 Feet Assistive device: Rolling walker (2 wheeled) Gait Pattern/deviations: Step-to pattern;Decreased stance time - left Gait velocity: decreased   General Gait Details: <25% VC's on proper sequencing and upright posture.  Required increased time.   Stairs            Wheelchair Mobility    Modified Rankin (Stroke Patients Only)       Balance                                    Cognition                            Exercises   Total Knee Replacement TE's 10 reps B LE ankle pumps 10 reps towel squeezes 10 reps knee presses 10 reps heel slides  10 reps  SAQ's 10 reps SLR's 10 reps ABD Followed by ICE     General Comments        Pertinent Vitals/Pain Pain Assessment: 0-10 Pain Score: 7  Pain Descriptors / Indicators: Aching Pain Intervention(s): Premedicated before session;Repositioned;Ice applied    Home Living                      Prior Function            PT Goals (current goals can now be found in the care plan section) Progress towards PT goals: Progressing toward goals    Frequency  7X/week    PT Plan      Co-evaluation             End of Session Equipment Utilized During Treatment: Left knee immobilizer Activity Tolerance: Patient tolerated treatment well Patient left: in chair;with call bell/phone within reach;with family/visitor present     Time: 8315-1761 PT Time Calculation (min): 28 min  Charges:  $Gait Training: 8-22 mins $Therapeutic Exercise: 8-22 mins                    G Codes:      Rica Koyanagi  PTA [facility]  Acute  Rehab Pager      [phone]

## 2013-09-29 NOTE — Discharge Instructions (Addendum)
° °Dr. Frank Aluisio °Total Joint Specialist °[facility] °[address] °[place], [place] [address] °[phone] ° °TOTAL KNEE REPLACEMENT POSTOPERATIVE DIRECTIONS ° ° ° °Knee Rehabilitation, Guidelines Following Surgery  °Results after knee surgery are often greatly improved when you follow the exercise, range of motion and muscle strengthening exercises prescribed by your doctor. Safety measures are also important to protect the knee from further injury. Any time any of these exercises cause you to have increased pain or swelling in your knee joint, decrease the amount until you are comfortable again and slowly increase them. If you have problems or questions, call your caregiver or physical therapist for advice.  ° °HOME CARE INSTRUCTIONS  °Remove items at home which could result in a fall. This includes throw rugs or furniture in walking pathways.  °Continue medications as instructed at time of discharge. °You may have some home medications which will be placed on hold until you complete the course of blood thinner medication.  °You may start showering once you are discharged home but do not submerge the incision under water. Just pat the incision dry and apply a dry gauze dressing on daily. °Walk with walker as instructed.  °You may resume a sexual relationship in one month or when given the OK by  your doctor.  °· Use walker as long as suggested by your caregivers. °· Avoid periods of inactivity such as sitting longer than an hour when not asleep. This helps prevent blood clots.  °You may put full weight on your legs and walk as much as is comfortable.  °You may return to work once you are cleared by your doctor.  °Do not drive a car for 6 weeks or until released by you surgeon.  °· Do not drive while taking narcotics.  °Wear the elastic stockings for three weeks following surgery during the day but you may remove then at night. °Make sure you keep all of your appointments after your  operation with all of your doctors and caregivers. You should call the office at the above phone number and make an appointment for approximately two weeks after the date of your surgery. °Change the dressing daily and reapply a dry dressing each time. °Please pick up a stool softener and laxative for home use as long as you are requiring pain medications. °· Continue to use ice on the knee for pain and swelling from surgery. You may notice swelling that will progress down to the foot and ankle.  This is normal after surgery.  Elevate the leg when you are not up walking on it.   °It is important for you to complete the blood thinner medication as prescribed by your doctor. °· Continue to use the breathing machine which will help keep your temperature down.  It is common for your temperature to cycle up and down following surgery, especially at night when you are not up moving around and exerting yourself.  The breathing machine keeps your lungs expanded and your temperature down. ° °RANGE OF MOTION AND STRENGTHENING EXERCISES  °Rehabilitation of the knee is important following a knee injury or an operation. After just a few days of immobilization, the muscles of the thigh which control the knee become weakened and shrink (atrophy). Knee exercises are designed to build up the tone and strength of the thigh muscles and to improve knee motion. Often times heat used for twenty to thirty minutes before working out will loosen up your tissues and help with improving the   range of motion but do not use heat for the first two weeks following surgery. These exercises can be done on a training (exercise) mat, on the floor, on a table or on a bed. Use what ever works the best and is most comfortable for you Knee exercises include:  Leg Lifts - While your knee is still immobilized in a splint or cast, you can do straight leg raises. Lift the leg to 60 degrees, hold for 3 sec, and slowly lower the leg. Repeat 10-20 times 2-3  times daily. Perform this exercise against resistance later as your knee gets better.  Quad and Hamstring Sets - Tighten up the muscle on the front of the thigh (Quad) and hold for 5-10 sec. Repeat this 10-20 times hourly. Hamstring sets are done by pushing the foot backward against an object and holding for 5-10 sec. Repeat as with quad sets.  A rehabilitation program following serious knee injuries can speed recovery and prevent re-injury in the future due to weakened muscles. Contact your doctor or a physical therapist for more information on knee rehabilitation.   SKILLED REHAB INSTRUCTIONS: If the patient is transferred to a skilled rehab facility following release from the hospital, a list of the current medications will be sent to the facility for the patient to continue.  When discharged from the skilled rehab facility, please have the facility set up the patient's [place] prior to being released. Also, the skilled facility will be responsible for providing the patient with their medications at time of release from the facility to include their pain medication, the muscle relaxants, and their blood thinner medication. If the patient is still at the rehab facility at time of the two week follow up appointment, the skilled rehab facility will also need to assist the patient in arranging follow up appointment in our office and any transportation needs.  MAKE SURE YOU:  Understand these instructions.  Will watch your condition.  Will get help right away if you are not doing well or get worse.    Pick up stool softner and laxative for home. Do not submerge incision under water. May shower. Continue to use ice for pain and swelling from surgery.  Take Xarelto for two and a half more weeks, then discontinue Xarelto. Once the patient has completed the blood thinner regimen, then take a Baby 81 mg Aspirin daily for three more weeks.  Information on my medicine - XARELTO  (Rivaroxaban)  This medication education was reviewed with me or my healthcare representative as part of my discharge preparation.  The pharmacist that spoke with me during my hospital stay was:  Julio Sicks, [facility]  Why was Xarelto prescribed for you? Xarelto was prescribed for you to reduce the risk of blood clots forming after orthopedic surgery. The medical term for these abnormal blood clots is venous thromboembolism (VTE).  What do you need to know about xarelto ? Take your Xarelto ONCE DAILY at the same time every day. You may take it either with or without food.  If you have difficulty swallowing the tablet whole, you may crush it and mix in applesauce just prior to taking your dose.  Take Xarelto exactly as prescribed by your doctor and DO NOT stop taking Xarelto without talking to the doctor who prescribed the medication.  Stopping without other VTE prevention medication to take the place of Xarelto may increase your risk of developing a clot.  After discharge, you should have regular check-up appointments  with your healthcare provider that is prescribing your Xarelto.    What do you do if you miss a dose? If you miss a dose, take it as soon as you remember on the same day then continue your regularly scheduled once daily regimen the next day. Do not take two doses of Xarelto on the same day.   Important Safety Information A possible side effect of Xarelto is bleeding. You should call your healthcare provider right away if you experience any of the following:   Bleeding from an injury or your nose that does not stop.   Unusual colored urine (red or dark brown) or unusual colored stools (red or black).   Unusual bruising for unknown reasons.   A serious fall or if you hit your head (even if there is no bleeding).  Some medicines may interact with Xarelto and might increase your risk of bleeding while on Xarelto. To help avoid this, consult your healthcare provider or  pharmacist prior to using any new prescription or non-prescription medications, including herbals, vitamins, non-steroidal anti-inflammatory drugs (NSAIDs) and supplements.  This website has more information on Xarelto: [email or web address]

## 2013-09-29 NOTE — Evaluation (Signed)
Occupational Therapy Evaluation Patient Details Name: TABOR BARTRAM MRN: [id number] DOB: 07/11/1955 Today's Date: 09/29/2013    History of Present Illness LTKA   Clinical Impression   Pt is familiar with ADL techniques from previous surgeries but wanted to review and practice with OT. Feel she will continue to progress with practice with functional transfers with PT. She states her husband can assist with LB dressing so reviewed sequence and she plans to sponge bathe. Will sign off for OT.    Follow Up Recommendations  No OT follow up;Supervision/Assistance - 24 hour    Equipment Recommendations  None recommended by OT    Recommendations for Other Services       Precautions / Restrictions Precautions Precautions: Knee Required Braces or Orthoses: Knee Immobilizer - Left Knee Immobilizer - Left: Discontinue once straight leg raise with < 10 degree lag Restrictions Weight Bearing Restrictions: No RLE Weight Bearing: Weight bearing as tolerated LLE Weight Bearing: Weight bearing as tolerated      Mobility Bed Mobility Overal bed mobility: Needs Assistance Bed Mobility: Supine to Sit     Supine to sit: Supervision        Transfers Overall transfer level: Needs assistance Equipment used: Rolling walker (2 wheeled) Transfers: Sit to/from Stand Sit to Stand: Min assist         General transfer comment: min to steady with rising from EOB/3in1. verbal cues for hand placement and LE management.    Balance                                            ADL Overall ADL's : Needs assistance/impaired Eating/Feeding: Independent;Sitting   Grooming: Wash/dry hands;Min guard;Standing   Upper Body Bathing: Set up;Sitting   Lower Body Bathing: Moderate assistance;Sit to/from stand   Upper Body Dressing : Set up;Sitting   Lower Body Dressing: Moderate assistance;Sit to/from stand   Toilet Transfer: Minimal assistance;Ambulation;BSC;RW   Toileting-  Clothing Manipulation and Hygiene: Minimal assistance;Sit to/from stand         General ADL Comments: Pt plans to stay on the main level which has a garden tub. She plans to sponge bathe initially like she did with previous knee surgery. Discussed use of 3in1 as a seat to bathe on at the sink. She has a stool for the tub upstairs. Pt states husband can assist with LB dressing. Demonstrated reacher option and explained AE but pt states she doesnt feel she needs AE at this time. Feel pt will progress with functional transfers with PT. All education completed.     Vision                     Perception     Praxis      Pertinent Vitals/Pain Pain Assessment: 0-10 Pain Score: 9  Pain Location: L knee Pain Descriptors / Indicators: Aching Pain Intervention(s): Repositioned;Ice applied     Hand Dominance     Extremity/Trunk Assessment Upper Extremity Assessment Upper Extremity Assessment: Overall WFL for tasks assessed           Communication Communication Communication: No difficulties   Cognition Arousal/Alertness: Awake/alert Behavior During Therapy: WFL for tasks assessed/performed Overall Cognitive Status: Within Functional Limits for tasks assessed                     General Comments  Exercises       Shoulder Instructions      Home Living Family/patient expects to be discharged to:: Private residence Living Arrangements: Spouse/significant other Available Help at Discharge: Family Type of Home: House Home Access: Stairs to enter [employer] of Steps: 2   Home Layout: Able to live on main level with bedroom/bathroom;Two level     Bathroom Shower/Tub: Tub/shower unit (upstairs)   Bathroom Toilet: Standard     Home Equipment: Environmental consultant - 2 wheels;Bedside commode;Shower seat          Prior Functioning/Environment Level of Independence: Independent             OT Diagnosis:     OT Problem List:     OT  Treatment/Interventions:      OT Goals(Current goals can be found in the care plan section) Acute Rehab OT Goals Patient Stated Goal: home when ready  OT Frequency:     Barriers to D/C:            Co-evaluation              End of Session Equipment Utilized During Treatment: Gait belt;Left knee immobilizer CPM Left Knee CPM Left Knee: Off  Activity Tolerance: Patient tolerated treatment well Patient left: in chair;with call bell/phone within reach   Time: 4742-5956 OT Time Calculation (min): 38 min Charges:  OT General Charges $OT Visit: 1 Procedure OT Evaluation $Initial OT Evaluation Tier I: 1 Procedure OT Treatments $Self Care/Home Management : 8-22 mins $Therapeutic Activity: 8-22 mins G-Codes:    Jules Schick [phone] 09/29/2013, 9:39 AM

## 2013-09-30 LAB — CBC
HCT: 32.5 % — ABNORMAL LOW (ref 36.0–46.0)
HEMOGLOBIN: 10.3 g/dL — AB (ref 12.0–15.0)
MCH: 29.6 pg (ref 26.0–34.0)
MCHC: 31.7 g/dL (ref 30.0–36.0)
MCV: 93.4 fL (ref 78.0–100.0)
Platelets: 269 10*3/uL (ref 150–400)
RBC: 3.48 MIL/uL — ABNORMAL LOW (ref 3.87–5.11)
RDW: 12.6 % (ref 11.5–15.5)
WBC: 10.6 10*3/uL — AB (ref 4.0–10.5)

## 2013-09-30 LAB — BASIC METABOLIC PANEL
Anion gap: 10 (ref 5–15)
BUN: 12 mg/dL (ref 6–23)
CO2: 28 meq/L (ref 19–32)
CREATININE: 0.76 mg/dL (ref 0.50–1.10)
Calcium: 9 mg/dL (ref 8.4–10.5)
Chloride: 101 mEq/L (ref 96–112)
GFR calc Af Amer: >90 mL/min (ref >90)
GFR calc non Af Amer: >90 mL/min (ref >90)
GLUCOSE: 93 mg/dL (ref 70–99)
Potassium: 4 mEq/L (ref 3.7–5.3)
SODIUM: 139 meq/L (ref 137–147)

## 2013-09-30 MED ORDER — HYDROMORPHONE HCL 2 MG PO TABS: 2.0000 mg | ORAL_TABLET | ORAL | Status: DC | PRN

## 2013-09-30 MED ORDER — RIVAROXABAN 10 MG PO TABS: 10.0000 mg | ORAL_TABLET | Freq: Every day | ORAL | Status: DC

## 2013-09-30 MED ORDER — METHOCARBAMOL 500 MG PO TABS: 500.0000 mg | ORAL_TABLET | Freq: Four times a day (QID) | ORAL | Status: DC | PRN

## 2013-09-30 MED ORDER — HYDROMORPHONE HCL 2 MG PO TABS
2.0000 mg | ORAL_TABLET | Freq: Once | ORAL | Status: AC
  Administered 2013-09-30: 2 mg via ORAL

## 2013-09-30 NOTE — Progress Notes (Signed)
Physical Therapy Treatment Patient Details Name: Kristi Avila MRN: [id number] DOB: 1955-06-15 Today's Date: 09/30/2013    History of Present Illness LTKA    PT Comments    POD # 2 spouse present for education.  Assisted pt OOB to amb to stairs.  Had spouse assist pt up/down stairs then returned to room to perform TE's following HEP.  All questions addressed.  Pt ready for D/C to home.  Follow Up Recommendations  Home health PT;Supervision/Assistance - 24 hour     Equipment Recommendations  None recommended by PT    Recommendations for Other Services       Precautions / Restrictions Precautions Precautions: Knee Precaution Comments: pt able to perform SLR so instructed to D/C KI Required Braces or Orthoses: Knee Immobilizer - Left Restrictions Weight Bearing Restrictions: No    Mobility  Bed Mobility Overal bed mobility: Needs Assistance Bed Mobility: Supine to Sit;Sit to Supine     Supine to sit: Min guard Sit to supine: Min guard   General bed mobility comments: support L leg t floor plus increased time to get OOB   Transfers Overall transfer level: Needs assistance Equipment used: Rolling walker (2 wheeled) Transfers: Sit to/from Stand           General transfer comment: increased time  Ambulation/Gait Ambulation/Gait assistance: Supervision Ambulation Distance (Feet): 65 Feet Assistive device: Rolling walker (2 wheeled) Gait Pattern/deviations: Step-to pattern;Step-through pattern;Decreased stance time - left Gait velocity: decreased   General Gait Details: increased time.  One VC on safety with backward gait.   Stairs Stairs: Yes Stairs assistance: Min assist Stair Management: No rails;Step to pattern;Backwards;With walker Number of Stairs: 2 General stair comments: with spouse  Wheelchair Mobility    Modified Rankin (Stroke Patients Only)       Balance                                    Cognition                             Exercises      General Comments        Pertinent Vitals/Pain Pain Assessment: 0-10 Pain Location: L knee Pain Descriptors / Indicators: Aching Pain Intervention(s): Premedicated before session;Repositioned;Ice applied    Home Living                      Prior Function            PT Goals (current goals can now be found in the care plan section) Progress towards PT goals: Progressing toward goals    Frequency  7X/week    PT Plan      Co-evaluation             End of Session Equipment Utilized During Treatment: Gait belt Activity Tolerance: Patient tolerated treatment well Patient left: in chair;with call bell/phone within reach;with family/visitor present     Time: 1105-1130 PT Time Calculation (min): 25 min  Charges:  $Gait Training: 8-22 mins $Therapeutic Exercise: 8-22 mins                     G Codes:      Rica Koyanagi  PTA [facility]  Acute  Rehab Pager      [phone]

## 2013-09-30 NOTE — Progress Notes (Signed)
   Subjective: 2 Days Post-Op Procedure(s) (LRB): LEFT TOTAL KNEE ARTHROPLASTY, CORTISONE INJECTION RIGHT HIP (Left) Patient reports pain as mild.   Patient seen in rounds with Dr. Wynelle Link. Patient is well, and has had no acute complaints or problems Patient is ready to go home today  Objective: Vital signs in last 24 hours: Temp:  [98.3 F (36.8 C)-99.3 F (37.4 C)] 98.8 F (37.1 C) (09/16 0537) Pulse Rate:  [67-83] 78 (09/16 0537) Resp:  [16-18] 18 (09/16 0537) BP: (111-141)/(64-81) 133/77 mmHg (09/16 0537) SpO2:  [95 %-98 %] 97 % (09/16 0537)  Intake/Output from previous day:  Intake/Output Summary (Last 24 hours) at 09/30/13 0727 Last data filed at 09/29/13 2225  Gross per 24 hour  Intake 1235.33 ml  Output   1050 ml  Net 185.33 ml    Intake/Output this shift:    Labs:  Recent Labs  09/29/13 0431 09/30/13 0450  HGB 10.9* 10.3*    Recent Labs  09/29/13 0431 09/30/13 0450  WBC 10.7* 10.6*  RBC 3.65* 3.48*  HCT 33.4* 32.5*  PLT 269 269    Recent Labs  09/29/13 0431 09/30/13 0450  NA 140 139  K 4.2 4.0  CL 104 101  CO2 27 28  BUN 11 12  CREATININE 0.79 0.76  GLUCOSE 98 93  CALCIUM 8.9 9.0   No results found for this basename: LABPT, INR,  in the last 72 hours  EXAM: General - Patient is Alert, Appropriate and Oriented Extremity - Neurovascular intact Sensation intact distally Dorsiflexion/Plantar flexion intact Incision - clean, dry, no drainage Motor Function - intact, moving foot and toes well on exam.   Assessment/Plan: 2 Days Post-Op Procedure(s) (LRB): LEFT TOTAL KNEE ARTHROPLASTY, CORTISONE INJECTION RIGHT HIP (Left) Procedure(s) (LRB): LEFT TOTAL KNEE ARTHROPLASTY, CORTISONE INJECTION RIGHT HIP (Left) Past Medical History  Diagnosis Date  . Reflux   . High cholesterol   . Overactive bladder   . Obesity   . Anxiety   . Depression   . Arthritis   . HSV-2 infection     anal  . GERD (gastroesophageal reflux disease)     h/o  peptic ulcer  . Frequency of urination   . Urgency of urination   . Nocturia   . Barrett's esophagus with esophagitis 07/23/12    EGD: CLO test results negative. pathology: [facility]: Repeat EGD 3 years  . PONV (postoperative nausea and vomiting) 09-22-12    felt weak for 6 months after surgery, had spinal  . Family history of anesthesia complication     mother trouble waking up  . Shortness of breath     last 6 months   Active Problems:   OA (osteoarthritis) of knee  Estimated body mass index is 36.01 kg/(m^2) as calculated from the following:   Height as of this encounter: 5\' 6"  (1.676 m).   Weight as of this encounter: 101.152 kg (223 lb). Up with therapy Discharge home with home health Diet - Regular diet Follow up - in 2 weeks Activity - WBAT Disposition - Home Condition Upon Discharge - Good D/C Meds - See DC Summary DVT Prophylaxis - [place], PA-C Orthopaedic Surgery 09/30/2013, 7:27 AM

## 2013-09-30 NOTE — Progress Notes (Signed)
Physical Therapy Treatment Patient Details Name: Kristi Avila MRN: [id number] DOB: Dec 18, 1955 Today's Date: 09/30/2013    History of Present Illness LTKA    PT Comments    POD # 2 am session.  Assisted pt OOB to amb in hallway.  Practiced going up 2 steps backward.  Returned to room and applied ICE as pt reported 9/10 pain plus time for more pain meds.   Follow Up Recommendations  Home health PT;Supervision/Assistance - 24 hour     Equipment Recommendations  None recommended by PT    Recommendations for Other Services       Precautions / Restrictions Precautions Precautions: Knee Precaution Comments: pt able to perform SLR so instructed to D/C KI Required Braces or Orthoses: Knee Immobilizer - Left Restrictions Weight Bearing Restrictions: No    Mobility  Bed Mobility Overal bed mobility: Needs Assistance Bed Mobility: Supine to Sit     Supine to sit: Min guard     General bed mobility comments: support L leg t floor plus increased time to get OOB   Transfers Overall transfer level: Needs assistance Equipment used: Rolling walker (2 wheeled) Transfers: Sit to/from Stand           General transfer comment: increased time  Ambulation/Gait Ambulation/Gait assistance: Supervision Ambulation Distance (Feet): 65 Feet Assistive device: Rolling walker (2 wheeled) Gait Pattern/deviations: Step-to pattern;Step-through pattern;Decreased stance time - left Gait velocity: decreased   General Gait Details: increased time.  One VC on safety with backward gait.   Stairs  up backward min assist with walker step to pattern          Wheelchair Mobility    Modified Rankin (Stroke Patients Only)       Balance                                    Cognition                            Exercises      General Comments        Pertinent Vitals/Pain Pain Assessment: 0-10 Pain Location: L knee Pain Descriptors / Indicators:  Aching Pain Intervention(s): Premedicated before session;Repositioned;Ice applied    Home Living                      Prior Function            PT Goals (current goals can now be found in the care plan section) Progress towards PT goals: Progressing toward goals    Frequency  7X/week    PT Plan      Co-evaluation             End of Session Equipment Utilized During Treatment: Gait belt Activity Tolerance: Patient tolerated treatment well Patient left: in chair;with call bell/phone within reach;with family/visitor present     Time: 6063-0160 PT Time Calculation (min): 29 min  Charges:  $Gait Training: 8-22 mins $Therapeutic Activity: 8-22 mins                    G Codes:      Rica Koyanagi  PTA [facility]  Acute  Rehab Pager      [phone]

## 2013-09-30 NOTE — Discharge Summary (Signed)
Physician Discharge Summary   Patient ID: Kristi Avila MRN: [id number] DOB/AGE: 57/09/1955 57 y.o.  Admit date: 09/28/2013 Discharge date: 09/30/2013  Primary Diagnosis:  Osteoarthritis Left knee(s)  2.- Right hip trochanteric bursitis  Admission Diagnoses:  Past Medical History  Diagnosis Date  . Reflux   . High cholesterol   . Overactive bladder   . Obesity   . Anxiety   . Depression   . Arthritis   . HSV-2 infection     anal  . GERD (gastroesophageal reflux disease)     h/o peptic ulcer  . Frequency of urination   . Urgency of urination   . Nocturia   . Barrett's esophagus with esophagitis 07/23/12    EGD: CLO test results negative. pathology: [place]: Repeat EGD 3 years  . PONV (postoperative nausea and vomiting) 09-22-12    felt weak for 6 months after surgery, had spinal  . Family history of anesthesia complication     mother trouble waking up  . Shortness of breath     last 6 months   Discharge Diagnoses:   Active Problems:   OA (osteoarthritis) of knee  Estimated body mass index is 36.01 kg/(m^2) as calculated from the following:   Height as of this encounter: $RemoveBeforeD'5\' 6"'[id number]$  (1.676 m).   Weight as of this encounter: 101.152 kg (223 lb).  Procedure:  Procedure(s) (LRB): LEFT TOTAL KNEE ARTHROPLASTY, CORTISONE INJECTION RIGHT HIP (Left)   Consults: None  HPI: Kristi Avila is a 58 y.o. year old female with end stage OA of her left knee with progressively worsening pain and dysfunction. She has constant pain, with activity and at rest and significant functional deficits with difficulties even with ADLs. She has had extensive non-op management including analgesics, injections of cortisone and viscosupplements, and home exercise program, but remains in significant pain with significant dysfunction. Radiographs show bone on bone arthritis lateral and patellofemoral. She presents now for left Total Knee Arthroplasty.   Laboratory Data: Admission on 09/28/2013, Discharged on  09/30/2013  Component Date Value Ref Range Status  . ABO/RH(D) 09/28/2013 AB POS   Final  . Antibody Screen 09/28/2013 NEG   Final  . Sample Expiration 09/28/2013 10/01/2013   Final  . WBC 09/29/2013 10.7* 4.0 - 10.5 K/uL Final  . RBC 09/29/2013 3.65* 3.87 - 5.11 MIL/uL Final  . Hemoglobin 09/29/2013 10.9* 12.0 - 15.0 g/dL Final  . HCT 09/29/2013 33.4* 36.0 - 46.0 % Final  . MCV 09/29/2013 91.5  78.0 - 100.0 fL Final  . MCH 09/29/2013 29.9  26.0 - 34.0 pg Final  . MCHC 09/29/2013 32.6  30.0 - 36.0 g/dL Final  . RDW 09/29/2013 12.5  11.5 - 15.5 % Final  . Platelets 09/29/2013 269  150 - 400 K/uL Final  . Sodium 09/29/2013 140  137 - 147 mEq/L Final  . Potassium 09/29/2013 4.2  3.7 - 5.3 mEq/L Final  . Chloride 09/29/2013 104  96 - 112 mEq/L Final  . CO2 09/29/2013 27  19 - 32 mEq/L Final  . Glucose, Bld 09/29/2013 98  70 - 99 mg/dL Final  . BUN 09/29/2013 11  6 - 23 mg/dL Final  . Creatinine, Ser 09/29/2013 0.79  0.50 - 1.10 mg/dL Final  . Calcium 09/29/2013 8.9  8.4 - 10.5 mg/dL Final  . GFR calc non Af Amer 09/29/2013 >90  >90 mL/min Final  . GFR calc Af Amer 09/29/2013 >90  >90 mL/min Final   Comment: (NOTE)  The eGFR has been calculated using the CKD EPI equation.                          This calculation has not been validated in all clinical situations.                          eGFR's persistently <90 mL/min signify possible Chronic Kidney                          Disease.  . Anion gap 09/29/2013 9  5 - 15 Final  . WBC 09/30/2013 10.6* 4.0 - 10.5 K/uL Final  . RBC 09/30/2013 3.48* 3.87 - 5.11 MIL/uL Final  . Hemoglobin 09/30/2013 10.3* 12.0 - 15.0 g/dL Final  . HCT 15/19/0414 32.5* 36.0 - 46.0 % Final  . MCV 09/30/2013 93.4  78.0 - 100.0 fL Final  . MCH 09/30/2013 29.6  26.0 - 34.0 pg Final  . MCHC 09/30/2013 31.7  30.0 - 36.0 g/dL Final  . RDW 31/51/1284 12.6  11.5 - 15.5 % Final  . Platelets 09/30/2013 269  150 - 400 K/uL Final  . Sodium  09/30/2013 139  137 - 147 mEq/L Final  . Potassium 09/30/2013 4.0  3.7 - 5.3 mEq/L Final  . Chloride 09/30/2013 101  96 - 112 mEq/L Final  . CO2 09/30/2013 28  19 - 32 mEq/L Final  . Glucose, Bld 09/30/2013 93  70 - 99 mg/dL Final  . BUN 99/93/3005 12  6 - 23 mg/dL Final  . Creatinine, Ser 09/30/2013 0.76  0.50 - 1.10 mg/dL Final  . Calcium 65/43/3947 9.0  8.4 - 10.5 mg/dL Final  . GFR calc non Af Amer 09/30/2013 >90  >90 mL/min Final  . GFR calc Af Amer 09/30/2013 >90  >90 mL/min Final   Comment: (NOTE)                          The eGFR has been calculated using the CKD EPI equation.                          This calculation has not been validated in all clinical situations.                          eGFR's persistently <90 mL/min signify possible Chronic Kidney                          Disease.  Eustaquio Boyden gap 09/30/2013 10  5 - 15 Final  Hospital Outpatient Visit on 09/17/2013  Component Date Value Ref Range Status  . MRSA, PCR 09/17/2013 NEGATIVE  NEGATIVE Final  . Staphylococcus aureus 09/17/2013 POSITIVE* NEGATIVE Final   Comment:                                 The Xpert SA Assay (FDA                          approved for NASAL specimens                          in patients over 21 years of  age),                          is one component of                          a comprehensive surveillance                          program.  Test performance has                          been validated by [facility] for patients greater                          than or equal to 25 year old.                          It is not intended                          to diagnose infection nor to                          guide or monitor treatment.  Marland Kitchen aPTT 09/17/2013 31  24 - 37 seconds Final  . WBC 09/17/2013 9.0  4.0 - 10.5 K/uL Final  . RBC 09/17/2013 4.26  3.87 - 5.11 MIL/uL Final  . Hemoglobin 09/17/2013 12.9  12.0 - 15.0 g/dL Final  . HCT 09/17/2013 38.6  36.0 - 46.0 %  Final  . MCV 09/17/2013 90.6  78.0 - 100.0 fL Final  . MCH 09/17/2013 30.3  26.0 - 34.0 pg Final  . MCHC 09/17/2013 33.4  30.0 - 36.0 g/dL Final  . RDW 09/17/2013 12.2  11.5 - 15.5 % Final  . Platelets 09/17/2013 321  150 - 400 K/uL Final  . Sodium 09/17/2013 133* 137 - 147 mEq/L Final  . Potassium 09/17/2013 4.3  3.7 - 5.3 mEq/L Final  . Chloride 09/17/2013 95* 96 - 112 mEq/L Final  . CO2 09/17/2013 27  19 - 32 mEq/L Final  . Glucose, Bld 09/17/2013 83  70 - 99 mg/dL Final  . BUN 09/17/2013 14  6 - 23 mg/dL Final  . Creatinine, Ser 09/17/2013 0.86  0.50 - 1.10 mg/dL Final  . Calcium 09/17/2013 10.0  8.4 - 10.5 mg/dL Final  . Total Protein 09/17/2013 7.4  6.0 - 8.3 g/dL Final  . Albumin 09/17/2013 4.1  3.5 - 5.2 g/dL Final  . AST 09/17/2013 17  0 - 37 U/L Final  . ALT 09/17/2013 22  0 - 35 U/L Final  . Alkaline Phosphatase 09/17/2013 86  39 - 117 U/L Final  . Total Bilirubin 09/17/2013 <0.2* 0.3 - 1.2 mg/dL Final  . GFR calc non Af Amer 09/17/2013 74* >90 mL/min Final  . GFR calc Af Amer 09/17/2013 86* >90 mL/min Final   Comment: (NOTE)                          The eGFR has been calculated using the CKD EPI equation.  This calculation has not been validated in all clinical situations.                          eGFR's persistently <90 mL/min signify possible Chronic Kidney                          Disease.  . Anion gap 09/17/2013 11  5 - 15 Final  . Prothrombin Time 09/17/2013 11.9  11.6 - 15.2 seconds Final  . INR 09/17/2013 0.88  0.00 - 1.49 Final  . Color, Urine 09/17/2013 YELLOW  YELLOW Final  . APPearance 09/17/2013 CLEAR  CLEAR Final  . Specific Gravity, Urine 09/17/2013 1.007  1.005 - 1.030 Final  . pH 09/17/2013 6.0  5.0 - 8.0 Final  . Glucose, UA 09/17/2013 NEGATIVE  NEGATIVE mg/dL Final  . Hgb urine dipstick 09/17/2013 NEGATIVE  NEGATIVE Final  . Bilirubin Urine 09/17/2013 NEGATIVE  NEGATIVE Final  . Ketones, ur 09/17/2013 NEGATIVE  NEGATIVE mg/dL  Final  . Protein, ur 09/17/2013 NEGATIVE  NEGATIVE mg/dL Final  . Urobilinogen, UA 09/17/2013 0.2  0.0 - 1.0 mg/dL Final  . Nitrite 09/17/2013 NEGATIVE  NEGATIVE Final  . Leukocytes, UA 09/17/2013 NEGATIVE  NEGATIVE Final   MICROSCOPIC NOT DONE ON URINES WITH NEGATIVE PROTEIN, BLOOD, LEUKOCYTES, NITRITE, OR GLUCOSE <1000 mg/dL.     X-Rays:Dg Chest 2 View  09/17/2013   CLINICAL DATA:  Shortness of breath  EXAM: CHEST  2 VIEW  COMPARISON:  Chest radiograph 10/07/2007.  FINDINGS: Stable cardiac and mediastinal contours. Lungs are clear. No pleural effusion or pneumothorax. Regional skeleton is unremarkable.  IMPRESSION: No acute cardiopulmonary process.   Electronically Signed   By: Lovey Newcomer M.D.   On: 09/17/2013 16:22    EKG: Orders placed in visit on 06/26/13  . EKG     Hospital Course: Kristi Avila is a 58 y.o. who was admitted to [facility]. They were brought to the operating room on 09/28/2013 and underwent Procedure(s): LEFT TOTAL KNEE ARTHROPLASTY, CORTISONE INJECTION RIGHT HIP.  Patient tolerated the procedure well and was later transferred to the recovery room and then to the orthopaedic floor for postoperative care.  They were given PO and IV analgesics for pain control following their surgery.  They were given 24 hours of postoperative antibiotics of  Anti-infectives   Start     Dose/Rate Route Frequency Ordered Stop   09/28/13 2200  vancomycin (VANCOCIN) IVPB 1000 mg/200 mL premix     1,000 mg 200 mL/hr over 60 Minutes Intravenous Every 12 hours 09/28/13 1239 09/28/13 2351   09/28/13 1800  acyclovir (ZOVIRAX) tablet 400 mg  Status:  Discontinued     400 mg Oral Every evening 09/28/13 1239 09/30/13 1700   09/28/13 0600  vancomycin (VANCOCIN) 1,500 mg in sodium chloride 0.9 % 500 mL IVPB     1,500 mg 250 mL/hr over 120 Minutes Intravenous On call to O.R. 09/27/13 1416 09/28/13 0949     and started on DVT prophylaxis in the form of Xarelto.   PT and OT were ordered for  total joint protocol.  Discharge planning consulted to help with postop disposition and equipment needs.  Patient had a good night on the evening of surgery and walked about 50 feet the day off surgery.  They started to get up OOB with therapy on day one. Hemovac drain was pulled without difficulty.  Continued to work with therapy into  day two.  Dressing was changed on day two and the incision was healing well.  Patient was seen in rounds and was ready to go home.  Discharge home with home health  Diet - Regular diet  Follow up - in 2 weeks  Activity - WBAT  Disposition - Home  Condition Upon Discharge - Good  D/C Meds - See DC Summary  DVT Prophylaxis - Xarelto      Discharge Instructions   Call MD / Call 911    Complete by:  As directed   If you experience chest pain or shortness of breath, CALL 911 and be transported to the hospital emergency room.  If you develope a fever above 101 F, pus (white drainage) or increased drainage or redness at the wound, or calf pain, call your surgeon's office.     Change dressing    Complete by:  As directed   Change dressing daily with sterile 4 x 4 inch gauze dressing and apply TED hose. Do not submerge the incision under water.     Constipation Prevention    Complete by:  As directed   Drink plenty of fluids.  Prune juice may be helpful.  You may use a stool softener, such as Colace (over the counter) 100 mg twice a day.  Use MiraLax (over the counter) for constipation as needed.     Diet - low sodium heart healthy    Complete by:  As directed      Discharge instructions    Complete by:  As directed   Pick up stool softner and laxative for home. Do not submerge incision under water. May shower. Continue to use ice for pain and swelling from surgery.  Take Xarelto for two and a half more weeks, then discontinue Xarelto. Once the patient has completed the blood thinner regimen, then take a Baby 81 mg Aspirin daily for three more weeks.     Do  not put a pillow under the knee. Place it under the heel.    Complete by:  As directed      Do not sit on low chairs, stoools or toilet seats, as it may be difficult to get up from low surfaces    Complete by:  As directed      Driving restrictions    Complete by:  As directed   No driving until released by the physician.     Increase activity slowly as tolerated    Complete by:  As directed      Lifting restrictions    Complete by:  As directed   No lifting until released by the physician.     Patient may shower    Complete by:  As directed   You may shower without a dressing once there is no drainage.  Do not wash over the wound.  If drainage remains, do not shower until drainage stops.     TED hose    Complete by:  As directed   Use stockings (TED hose) for 3 weeks on both leg(s).  You may remove them at night for sleeping.     Weight bearing as tolerated    Complete by:  As directed             Medication List    STOP taking these medications       meloxicam 15 MG tablet  Commonly known as:  MOBIC     multivitamin tablet     VITAMIN B 12 PO  VITAMIN D3 COMPLETE PO      TAKE these medications       acyclovir 400 MG tablet  Commonly known as:  ZOVIRAX  Take 400 mg by mouth every evening.     ALPRAZolam 0.25 MG tablet  Commonly known as:  XANAX  Take 0.25 mg by mouth at bedtime.     docusate sodium 100 MG capsule  Commonly known as:  COLACE  Take 100 mg by mouth 2 (two) times daily.     DULoxetine 60 MG capsule  Commonly known as:  CYMBALTA  Take 60 mg by mouth every morning.     ezetimibe 10 MG tablet  Commonly known as:  ZETIA  Take 10 mg by mouth every evening.     fluticasone 50 MCG/ACT nasal spray  Commonly known as:  FLONASE  Place 1 spray into both nostrils daily.     HYDROmorphone 2 MG tablet  Commonly known as:  DILAUDID  Take 1-2 tablets (2-4 mg total) by mouth every 3 (three) hours as needed for moderate pain or severe pain.      Lidocaine-Hydrocortisone Ace 3-2.5 % Kit  Place rectally 2 (two) times daily as needed (only with flare ups).     methocarbamol 500 MG tablet  Commonly known as:  ROBAXIN  Take 1 tablet (500 mg total) by mouth every 6 (six) hours as needed for muscle spasms.     metoCLOPramide 10 MG tablet  Commonly known as:  REGLAN  Take 10 mg by mouth 2 (two) times daily.     omeprazole-sodium bicarbonate 40-1100 MG per capsule  Commonly known as:  ZEGERID  Take 1 capsule by mouth every evening.     oxybutynin 10 MG 24 hr tablet  Commonly known as:  DITROPAN-XL  Take 1 tablet by mouth daily.     rivaroxaban 10 MG Tabs tablet  Commonly known as:  XARELTO  - Take 1 tablet (10 mg total) by mouth daily with breakfast. Take Xarelto for two and a half more weeks, then discontinue Xarelto.  - Once the patient has completed the blood thinner regimen, then take a Baby 81 mg Aspirin daily for three more weeks.       Follow-up Information   Follow up with [name], MD. Schedule an appointment as soon as possible for a visit on 10/13/2013. (Call office at [phone] to set up appointment on Tuesday 10/13/2013.)    Specialty:  Orthopedic Surgery   Contact information:   [address] [place] [address] [phone]       Follow up with [facility]. (home health physical therapy)    Contact information:   [address] [place] [address] [phone]       Signed: Arlee Muslim, PA-C Orthopaedic Surgery 10/14/2013, 2:06 PM

## 2013-09-30 NOTE — Care Management Note (Signed)
    Page 1 of 1   09/30/2013     9:37:08 AM CARE MANAGEMENT NOTE 09/30/2013  Patient:  Kristi Avila, Kristi Avila   Account Number:  [id number]  Date Initiated:  09/30/2013  Documentation initiated by:  [facility]  Subjective/Objective Assessment:   adm: LEFT TOTAL KNEE ARTHROPLASTY, CORTISONE INJECTION RIGHT HIP (Left)     Action/Plan:   discharge planning   Anticipated DC Date:  09/30/2013   Anticipated DC Plan:  [place]  CM consult      [facility] Choice  HOME HEALTH   Choice offered to / List presented to:  C-1 Patient        [place] arranged  HH-2 PT      [place]   Status of service:  Completed, signed off Medicare Important Message given?   (If response is "NO", the following Medicare IM given date fields will be blank) Date Medicare IM given:   Medicare IM given by:   Date Additional Medicare IM given:   Additional Medicare IM given by:    Discharge Disposition:  [place]  Per UR Regulation:    If discussed at Long Length of Stay Meetings, dates discussed:    Comments:  09/30/13 09:30 CM met with pt in room to offer choice and pt chooses [place] for HHPT.  No DME is needed as she has ahd previous surgery.  Referral made to [place] (on unit).  No other CM needs were communicated.  Mariane Masters, BSN, [place].

## 2013-10-13 ENCOUNTER — Other Ambulatory Visit: Payer: Self-pay | Admitting: Physician Assistant

## 2013-10-13 NOTE — Telephone Encounter (Signed)
Refill appropriate and filled per protocol. 

## 2013-10-30 ENCOUNTER — Other Ambulatory Visit: Payer: Self-pay | Admitting: Physician Assistant

## 2013-10-30 NOTE — Telephone Encounter (Signed)
Medication refilled per protocol. 

## 2013-11-26 LAB — HM MAMMOGRAPHY: HM Mammogram: NORMAL

## 2013-11-26 LAB — HM DEXA SCAN: HM Dexa Scan: NORMAL

## 2013-11-27 ENCOUNTER — Other Ambulatory Visit: Payer: Self-pay | Admitting: Physician Assistant

## 2013-11-27 NOTE — Telephone Encounter (Signed)
Medication refilled per protocol. 

## 2013-11-30 ENCOUNTER — Encounter: Payer: Self-pay | Admitting: Family Medicine

## 2013-12-08 ENCOUNTER — Encounter: Payer: Self-pay | Admitting: Family Medicine

## 2013-12-30 ENCOUNTER — Encounter: Payer: Self-pay | Admitting: Physician Assistant

## 2014-01-01 ENCOUNTER — Encounter: Payer: Self-pay | Admitting: Family Medicine

## 2014-01-03 ENCOUNTER — Other Ambulatory Visit: Payer: Self-pay | Admitting: Physician Assistant

## 2014-01-04 MED ORDER — MELOXICAM 15 MG PO TABS: 15.0000 mg | ORAL_TABLET | Freq: Every day | ORAL | Status: DC

## 2014-01-04 NOTE — Telephone Encounter (Signed)
Discontinued Mobic and called pt to tell her that she should not take Mobic and Xarelto at same time.  Pt states not no Xarelto any longer.  Has been stopped by specialist after her surgery.  Per provider we will refill Mobic and Rx sent to pharmacy

## 2014-01-04 NOTE — Telephone Encounter (Signed)
Red flag about taking Meloxicam and Xarelto?  OK refill?

## 2014-01-04 NOTE — Telephone Encounter (Signed)
Should NOT take this with Xarelto. Stop Mobic.

## 2014-02-07 ENCOUNTER — Other Ambulatory Visit: Payer: Self-pay | Admitting: Physician Assistant

## 2014-02-11 ENCOUNTER — Ambulatory Visit: Payer: Self-pay | Admitting: Physician Assistant

## 2014-02-15 ENCOUNTER — Ambulatory Visit (INDEPENDENT_AMBULATORY_CARE_PROVIDER_SITE_OTHER): Payer: BLUE CROSS/BLUE SHIELD | Admitting: Physician Assistant

## 2014-02-15 ENCOUNTER — Encounter: Payer: Self-pay | Admitting: Physician Assistant

## 2014-02-15 VITALS — BP 106/70 | HR 80 | Temp 98.5°F | Resp 20 | Wt 222.0 lb

## 2014-02-15 DIAGNOSIS — E669 Obesity, unspecified: Secondary | ICD-10-CM

## 2014-02-15 DIAGNOSIS — M170 Bilateral primary osteoarthritis of knee: Secondary | ICD-10-CM

## 2014-02-15 DIAGNOSIS — E7800 Pure hypercholesterolemia, unspecified: Secondary | ICD-10-CM

## 2014-02-15 DIAGNOSIS — K219 Gastro-esophageal reflux disease without esophagitis: Secondary | ICD-10-CM

## 2014-02-15 DIAGNOSIS — F329 Major depressive disorder, single episode, unspecified: Secondary | ICD-10-CM

## 2014-02-15 DIAGNOSIS — K209 Esophagitis, unspecified without bleeding: Secondary | ICD-10-CM

## 2014-02-15 DIAGNOSIS — B009 Herpesviral infection, unspecified: Secondary | ICD-10-CM

## 2014-02-15 DIAGNOSIS — M159 Polyosteoarthritis, unspecified: Secondary | ICD-10-CM

## 2014-02-15 DIAGNOSIS — M1990 Unspecified osteoarthritis, unspecified site: Secondary | ICD-10-CM

## 2014-02-15 DIAGNOSIS — F32A Depression, unspecified: Secondary | ICD-10-CM

## 2014-02-15 DIAGNOSIS — K2270 Barrett's esophagus without dysplasia: Secondary | ICD-10-CM

## 2014-02-15 DIAGNOSIS — F419 Anxiety disorder, unspecified: Secondary | ICD-10-CM

## 2014-02-15 DIAGNOSIS — N3281 Overactive bladder: Secondary | ICD-10-CM

## 2014-02-15 MED ORDER — EZETIMIBE 10 MG PO TABS: 10.0000 mg | ORAL_TABLET | Freq: Every evening | ORAL | Status: DC

## 2014-02-15 MED ORDER — ALPRAZOLAM 0.25 MG PO TABS: 0.2500 mg | ORAL_TABLET | Freq: Every day | ORAL | Status: DC

## 2014-02-15 NOTE — Progress Notes (Signed)
Patient ID: Kristi Avila MRN: [id number], DOB: 11/05/1955, 59 y.o. Date of Encounter: $RemoveBefor'@DATE'[id number]$ @  Chief Complaint:  Chief Complaint  Patient presents with  . routine check up/med RF    HPI: 59 y.o. year old female  presents for Preop /Surgical Clearance.   She has no personal history of CAD, PVD, or diabetes. She has no family history of premature CAD or cardiovascular disease. She rides a stationary bike every morning for 15 minutes. Has no angina symptoms with this. No increased amount of chest pressure, heaviness, tightness and no increased amount of dyspnea on exertion compared to her usual baseline.  She sees the following medical providers on a routine basis/recently: Dr. Maureen Ralphs at [facility] for her knees --she recently had Left TKR and now is scheduled for Right TKR Dr. McDirmiad, Urologist Says that she sees a dermatologist once a year Also sees gynecologist Dr. Ronita Hipps says she has seen GI in [facility] but only for EGD and colonoscopy  Hyperlipidemia:   simvastatin and pravastatin caused significant myalgias. She is now on Zetia and tolerating this. She's currently taking over-the-counter fish oil.   At her office visit with me on 03/26/2012 she reported  symptoms of overactive bladder. At that visit she reported several month history of urinary frequency nocturia and leaking at times. She was started on Detrol XL 2 mg daily. Since then she has called reporting continued symptoms and the dose was increased to 4 mg. She reports that even with the 4 mg she's continuing to have symptoms. She wakes up 4 to 5 times at night to urinate. As well she says that at the end of urination she has some leaking.  she had no adverse effects to the medication. She is now on Toviaz. This works well. Causes no adv effects.   Anxiety/depression she is  still on the Cymbalta 60 mg 2 tablet daily. This continues to control her anxiety and depression and her mood is stable on  medication.She has no adverse effects to this medication. She says that she rarely uses Xanax. Says that her current bottle has actually expired. However she does want to get a refill on this while she is here-- she wants to have it available if she has high stress at work or if cannot sleep at night.  At office visit 02/15/14 she says that all of her current medications are working well. Says that the Cymbalta seems to be the perfect fit for her. Controls were mood perfectly. Says that she does want to continue to have Xanax on hand just to have available if has a really stressful day. Says if I give her #30 with 1 refill to last her a whole year. Says that she has been having me do refills for some of her other medications even though she does see the specialist on a fairly regular basis. Says that her overactive bladder symptoms are well controlled with current medication. Says the only problem she has been having is an itchy rash for which she has seen the dermatologist multiple times here recently. They currently have her on prednisone and doxycycline hoping to get rid of this rash. Otherwise she has been doing well. She feels much better since having her knee replacements.   Past Medical History  Diagnosis Date  . Reflux   . High cholesterol   . Overactive bladder   . Obesity   . Anxiety   . Depression   . Arthritis   . HSV-2 infection  anal  . GERD (gastroesophageal reflux disease)     h/o peptic ulcer  . Frequency of urination   . Urgency of urination   . Nocturia   . Barrett's esophagus with esophagitis 07/23/12    EGD: CLO test results negative. pathology: Barretss: Repeat EGD 3 years  . PONV (postoperative nausea and vomiting) 09-22-12    felt weak for 6 months after surgery, had spinal  . Family history of anesthesia complication     mother trouble waking up  . Shortness of breath     last 6 months     Home Meds:  Outpatient Prescriptions Prior to Visit  Medication Sig  Dispense Refill  . acyclovir (ZOVIRAX) 400 MG tablet take 1 tablet by mouth every evening (Patient taking differently: take 1 tablet by mouth BID) 30 tablet 1  . ALPRAZolam (XANAX) 0.25 MG tablet Take 0.25 mg by mouth at bedtime.    . docusate sodium (COLACE) 100 MG capsule Take 100 mg by mouth 2 (two) times daily.     . DULoxetine (CYMBALTA) 60 MG capsule take 2 capsules by mouth once daily 60 capsule 1  . ezetimibe (ZETIA) 10 MG tablet Take 10 mg by mouth every evening.    . fluticasone (FLONASE) 50 MCG/ACT nasal spray Place 1 spray into both nostrils daily.    . Lidocaine-Hydrocortisone Ace 3-2.5 % KIT Place rectally 2 (two) times daily as needed (only with flare ups).    . meloxicam (MOBIC) 15 MG tablet Take 1 tablet (15 mg total) by mouth daily. 30 tablet 1  . metoCLOPramide (REGLAN) 10 MG tablet Take 10 mg by mouth 2 (two) times daily.    Marland Kitchen MYRBETRIQ 25 MG TB24 tablet TAKE 1 TABLET BY MOUTH EVERY DAY 30 tablet 3  . omeprazole-sodium bicarbonate (ZEGERID) 40-1100 MG per capsule Take 1 capsule by mouth every evening.    Marland Kitchen oxybutynin (DITROPAN-XL) 10 MG 24 hr tablet Take 1 tablet by mouth daily.    Marland Kitchen acyclovir (ZOVIRAX) 400 MG tablet Take 400 mg by mouth every evening.    Marland Kitchen HYDROmorphone (DILAUDID) 2 MG tablet Take 1-2 tablets (2-4 mg total) by mouth every 3 (three) hours as needed for moderate pain or severe pain. (Patient not taking: Reported on 02/15/2014) 80 tablet 0  . methocarbamol (ROBAXIN) 500 MG tablet Take 1 tablet (500 mg total) by mouth every 6 (six) hours as needed for muscle spasms. (Patient not taking: Reported on 02/15/2014) 80 tablet 0   No facility-administered medications prior to visit.    Allergies:  Allergies  Allergen Reactions  . Cephalosporins Diarrhea  . Codeine Itching  . Latex Itching  . Pravastatin     Myalgias  . Simvastatin     myalgias  . Sulfa Antibiotics Diarrhea    History   Social History  . Marital Status: Divorced    Spouse Name: N/A     Number of Children: N/A  . Years of Education: N/A   Occupational History  . Not on file.   Social History Main Topics  . Smoking status: Never Smoker   . Smokeless tobacco: Never Used  . Alcohol Use: Yes     Comment: X2 PER WK  . Drug Use: No  . Sexual Activity: Not on file   Other Topics Concern  . Not on file   Social History Narrative    Family History  Problem Relation Age of Onset  . Cancer Sister     pancreatic  . Diabetes Brother  Review of Systems:  See HPI for pertinent ROS. All other ROS negative.    Physical Exam: Blood pressure 106/70, pulse 80, temperature 98.5 F (36.9 C), temperature source Oral, resp. rate 20, weight 222 lb (100.699 kg)., Body mass index is 35.85 kg/(m^2). General: Obese white female. Appears in no acute distress. Neck: Supple. No thyromegaly. No lymphadenopathy.No carotid bruits. Lungs: Clear bilaterally to auscultation without wheezes, rales, or rhonchi. Breathing is unlabored. Heart: RRR with S1 S2. No murmurs, rubs, or gallops. Abdomen: Soft, non-tender, non-distended with normoactive bowel sounds. No hepatomegaly. No rebound/guarding. No obvious abdominal masses. Musculoskeletal:  Strength and tone normal for age. Extremities/Skin: Warm and dry. No edema. Neuro: Alert and oriented X 3. Moves all extremities spontaneously. Gait is normal. CNII-XII grossly in tact. Psych:  Responds to questions appropriately with a normal affect.     ASSESSMENT AND PLAN:  59 y.o. year old female with   She is not fasting. She says that it would be much easier for her to go to the [place] at  [facility] to do fasting lab work there. I have written her an order slip. As well she says that she is having a lot of fatigue and wants to check labs to evaluate for that. Have given her an order for:  lipid panel CMET TSH CBC Have written on the order for them to fax the results to [place] [phone]. Follow-up these results and follow  patient. Otherwise can wait 6-12 months for routine follow-up visit.   High cholesterol She is intolerant to statins. Continue Zetia and fish oil.   Overactive bladder Controlled with Toviaz  3. Obesity Have discussed diet and exercise multiple times.   4. Anxiety Continue current dose of Cymbalta. - ALPRAZolam (XANAX) 0.25 MG tablet; TAKE 1 TABLET BY MOUTH EVERY DAY AS NEEDED  Dispense: 30 tablet; Refill: 1  5. Depression Continue current dose of Cymbalta  6. GERD (gastroesophageal reflux disease)  7. Osteoarthritis of multiple joints  8. HSV-2 infection  9. OA (osteoarthritis) of knee  10. Barrett's esophagus with esophagitis Per GI  11. Arthritis  She had a complete physical exam 06/04/2011. All preventive care was updated at that time.  Colonoscopy:  6 /15/2007. Negative. Repeat 10 years. Screening labs: Performed 06/04/11. All were excellent. Immunizations:Tdap  Given here 06/04/2011 Pap: Done here 06/04/11 --Cytology was normal. HPV was not detected.  Marin Olp [place], [place], [facility] 02/15/2014 2:31 PM

## 2014-02-19 ENCOUNTER — Other Ambulatory Visit: Payer: Self-pay | Admitting: Physician Assistant

## 2014-02-19 LAB — COMPREHENSIVE METABOLIC PANEL
ALBUMIN: 4.3 g/dL (ref 3.5–5.2)
ALT: 37 U/L — ABNORMAL HIGH (ref 0–35)
AST: 14 U/L (ref 0–37)
Alkaline Phosphatase: 82 U/L (ref 39–117)
BILIRUBIN TOTAL: 0.4 mg/dL (ref 0.2–1.2)
BUN: 14 mg/dL (ref 6–23)
CALCIUM: 9.4 mg/dL (ref 8.4–10.5)
CHLORIDE: 103 meq/L (ref 96–112)
CO2: 27 mEq/L (ref 19–32)
Creat: 0.87 mg/dL (ref 0.50–1.10)
Glucose, Bld: 87 mg/dL (ref 70–99)
POTASSIUM: 4.3 meq/L (ref 3.5–5.3)
SODIUM: 141 meq/L (ref 135–145)
Total Protein: 6.8 g/dL (ref 6.0–8.3)

## 2014-02-19 LAB — CBC
HCT: 41.5 % (ref 36.0–46.0)
Hemoglobin: 13.6 g/dL (ref 12.0–15.0)
MCH: 28.6 pg (ref 26.0–34.0)
MCHC: 32.8 g/dL (ref 30.0–36.0)
MCV: 87.2 fL (ref 78.0–100.0)
MPV: 8.7 fL (ref 8.6–12.4)
PLATELETS: 394 10*3/uL (ref 150–400)
RBC: 4.76 MIL/uL (ref 3.87–5.11)
RDW: 14.3 % (ref 11.5–15.5)
WBC: 8.9 10*3/uL (ref 4.0–10.5)

## 2014-02-19 LAB — CBC AND DIFFERENTIAL: WBC: 8.9 10*3/mL

## 2014-02-19 LAB — LIPID PANEL
CHOL/HDL RATIO: 3.5 ratio
CHOLESTEROL: 193 mg/dL (ref 0–200)
HDL: 55 mg/dL (ref >39)
LDL Cholesterol: 87 mg/dL (ref 0–99)
LDL/HDL RATIO: 3.5
TRIGLYCERIDES: 256 mg/dL — AB (ref <150)
VLDL: 51 mg/dL — ABNORMAL HIGH (ref 0–40)

## 2014-02-19 LAB — HEPATIC FUNCTION PANEL: BILIRUBIN, TOTAL: 0.4 mg/dL

## 2014-02-19 LAB — TSH
TSH: 2.12 u[IU]/mL (ref 0.41–5.90)
TSH: 2.123 u[IU]/mL (ref 0.350–4.500)

## 2014-02-19 LAB — BASIC METABOLIC PANEL
BUN: 14 mg/dL (ref 4–21)
Creatinine: 0.9 mg/dL (ref 0.5–1.1)
GLUCOSE: 87 mg/dL
Sodium: 141 mmol/L (ref 137–147)

## 2014-02-22 ENCOUNTER — Telehealth: Payer: Self-pay | Admitting: Family Medicine

## 2014-02-22 ENCOUNTER — Encounter: Payer: Self-pay | Admitting: Family Medicine

## 2014-02-22 NOTE — Telephone Encounter (Signed)
Patient called about lab results.  Told all normal.  Continue all medications no changes. 

## 2014-02-25 ENCOUNTER — Telehealth: Payer: Self-pay | Admitting: Physician Assistant

## 2014-02-25 NOTE — Telephone Encounter (Signed)
[facility] [address]  Patient would like refills on her generic cymbalta  [phone]

## 2014-02-26 MED ORDER — DULOXETINE HCL 60 MG PO CPEP: 120.0000 mg | ORAL_CAPSULE | Freq: Every day | ORAL | Status: DC

## 2014-02-26 NOTE — Telephone Encounter (Signed)
Medication refilled per protocol. 

## 2014-03-30 ENCOUNTER — Other Ambulatory Visit: Payer: Self-pay | Admitting: Physician Assistant

## 2014-03-30 NOTE — Telephone Encounter (Signed)
Medication refilled per protocol. 

## 2014-04-15 ENCOUNTER — Telehealth: Payer: Self-pay | Admitting: Physician Assistant

## 2014-04-15 NOTE — Telephone Encounter (Signed)
Left pt message that her provider is out of office until Monday.  Will call her then with recommendations.

## 2014-04-15 NOTE — Telephone Encounter (Signed)
Patient is calling to say she is trying to lose weight and would like to know if mary beth could call in phentermine for her if possible  [phone]

## 2014-04-16 ENCOUNTER — Other Ambulatory Visit: Payer: Self-pay | Admitting: Physician Assistant

## 2014-04-16 NOTE — Telephone Encounter (Signed)
Medication refilled per protocol. 

## 2014-04-19 MED ORDER — METOCLOPRAMIDE HCL 10 MG PO TABS: 10.0000 mg | ORAL_TABLET | Freq: Three times a day (TID) | ORAL | Status: DC

## 2014-04-19 NOTE — Telephone Encounter (Signed)
Do not recommend diet medications for anyone. Do not recommend using Phenteramine-- especially for her-- given age of 28,  hyperlipidemia etc. Recommend diet changes and cardiovascular exercise.

## 2014-04-19 NOTE — Telephone Encounter (Signed)
Called pt and told not good candidate for diet pills.  Try diet and exercise.  Said need refill of Metoclopramide. Medication refilled per protocol.

## 2014-05-26 ENCOUNTER — Other Ambulatory Visit: Payer: Self-pay | Admitting: Family Medicine

## 2014-05-26 MED ORDER — OMEPRAZOLE-SODIUM BICARBONATE 40-1100 MG PO CAPS: 1.0000 | ORAL_CAPSULE | Freq: Two times a day (BID) | ORAL | Status: DC

## 2014-05-26 NOTE — Telephone Encounter (Signed)
Medication refilled per protocol. 

## 2014-08-07 ENCOUNTER — Other Ambulatory Visit: Payer: Self-pay | Admitting: Physician Assistant

## 2014-08-09 ENCOUNTER — Encounter: Payer: Self-pay | Admitting: Family Medicine

## 2014-08-09 NOTE — Telephone Encounter (Signed)
Medication refill for one time only.  Patient needs to be seen.  Letter sent for patient to call and schedule 

## 2014-08-19 ENCOUNTER — Telehealth: Payer: Self-pay | Admitting: Family Medicine

## 2014-08-19 NOTE — Telephone Encounter (Signed)
PA submitted thru [place] for Zegerid (omeprazole/bicarb)

## 2014-08-19 NOTE — Telephone Encounter (Signed)
PA for Zegerid approved from 08/19/2014 - 01/14/2038.  Pharmacy made aware.

## 2014-08-23 ENCOUNTER — Encounter: Payer: Self-pay | Admitting: Physician Assistant

## 2014-08-23 ENCOUNTER — Ambulatory Visit (INDEPENDENT_AMBULATORY_CARE_PROVIDER_SITE_OTHER): Payer: BLUE CROSS/BLUE SHIELD | Admitting: Physician Assistant

## 2014-08-23 VITALS — BP 110/60 | HR 60 | Temp 98.1°F | Resp 16 | Wt 196.0 lb

## 2014-08-23 DIAGNOSIS — F329 Major depressive disorder, single episode, unspecified: Secondary | ICD-10-CM

## 2014-08-23 DIAGNOSIS — M170 Bilateral primary osteoarthritis of knee: Secondary | ICD-10-CM | POA: Diagnosis not present

## 2014-08-23 DIAGNOSIS — F419 Anxiety disorder, unspecified: Secondary | ICD-10-CM

## 2014-08-23 DIAGNOSIS — K209 Esophagitis, unspecified without bleeding: Secondary | ICD-10-CM

## 2014-08-23 DIAGNOSIS — E669 Obesity, unspecified: Secondary | ICD-10-CM | POA: Diagnosis not present

## 2014-08-23 DIAGNOSIS — K219 Gastro-esophageal reflux disease without esophagitis: Secondary | ICD-10-CM

## 2014-08-23 DIAGNOSIS — E7800 Pure hypercholesterolemia, unspecified: Secondary | ICD-10-CM

## 2014-08-23 DIAGNOSIS — M159 Polyosteoarthritis, unspecified: Secondary | ICD-10-CM

## 2014-08-23 DIAGNOSIS — B009 Herpesviral infection, unspecified: Secondary | ICD-10-CM | POA: Diagnosis not present

## 2014-08-23 DIAGNOSIS — F32A Depression, unspecified: Secondary | ICD-10-CM

## 2014-08-23 DIAGNOSIS — M1990 Unspecified osteoarthritis, unspecified site: Secondary | ICD-10-CM | POA: Diagnosis not present

## 2014-08-23 DIAGNOSIS — K2270 Barrett's esophagus without dysplasia: Secondary | ICD-10-CM | POA: Diagnosis not present

## 2014-08-23 DIAGNOSIS — N3281 Overactive bladder: Secondary | ICD-10-CM | POA: Diagnosis not present

## 2014-08-23 NOTE — Progress Notes (Signed)
Patient ID: BRYCELYNN STAMPLEY MRN: [id number], DOB: 23-Oct-1955, 59 y.o. Date of Encounter: $RemoveBefor'@DATE'[id number]$ @  Chief Complaint:  Chief Complaint  Patient presents with  . Medication Refill    HPI: 59 y.o. year old female  presents for routine follow-up office visit.   She sees the following medical providers on a routine basis/recently: Dr. Maureen Ralphs at [facility] for her knees --she recently had Left TKR and now is scheduled for Right TKR Dr. McDirmiad, Urologist Says that she sees a dermatologist once a year Also sees gynecologist Dr. Ronita Hipps says she has seen GI in [facility] but only for EGD and colonoscopy  Hyperlipidemia:   simvastatin and pravastatin caused significant myalgias. She is now on Zetia and tolerating this. She's currently taking over-the-counter fish oil.  OAB: She sees Urology. She is on both Myrbetriq and oxybutynin.  Anxiety/depression she is  still on the Cymbalta 60 mg 2 tablet daily. This continues to control her anxiety and depression and her mood is stable on medication.She has no adverse effects to this medication. She says that she rarely uses Xanax.   She had 2nd knee replacement in 2016. Says this second surgery went better than the first.  Since then she says that she also did a weight loss program for 12 weeks. Says that included some supplements as well as a very strict diet for 12 weeks. Says that she completed that 12 week treatment one month ago. Says that she is still on some maintenance supplement and trying to eat healthy and keep maintain her weight loss. Weight at office visit 02/15/2014 was 222. Weight today 08/23/2014 is 196. Lost 26 pounds.  Past Medical History  Diagnosis Date  . Reflux   . High cholesterol   . Overactive bladder   . Obesity   . Anxiety   . Depression   . Arthritis   . HSV-2 infection     anal  . GERD (gastroesophageal reflux disease)     h/o peptic ulcer  . Frequency of urination   . Urgency of urination   .  Nocturia   . Barrett's esophagus with esophagitis 07/23/12    EGD: CLO test results negative. pathology: Barretss: Repeat EGD 3 years  . PONV (postoperative nausea and vomiting) 09-22-12    felt weak for 6 months after surgery, had spinal  . Family history of anesthesia complication     mother trouble waking up  . Shortness of breath     last 6 months     Home Meds:  Outpatient Prescriptions Prior to Visit  Medication Sig Dispense Refill  . acyclovir (ZOVIRAX) 400 MG tablet Take 1 tablet (400 mg total) by mouth 2 (two) times daily. 60 tablet 2  . ALPRAZolam (XANAX) 0.25 MG tablet Take 1 tablet (0.25 mg total) by mouth at bedtime. 30 tablet 2  . docusate sodium (COLACE) 100 MG capsule Take 100 mg by mouth 2 (two) times daily.     . DULoxetine (CYMBALTA) 60 MG capsule Take 2 capsules (120 mg total) by mouth daily. 60 capsule 5  . fluticasone (FLONASE) 50 MCG/ACT nasal spray Place 1 spray into both nostrils daily.    . Lidocaine-Hydrocortisone Ace 3-2.5 % KIT Place rectally 2 (two) times daily as needed (only with flare ups).    . meloxicam (MOBIC) 15 MG tablet Take 1 tablet (15 mg total) by mouth daily. 30 tablet 1  . metoCLOPramide (REGLAN) 10 MG tablet Take 1 tablet (10 mg total) by mouth 3 (three) times  daily before meals. 270 tablet 1  . MYRBETRIQ 25 MG TB24 tablet TAKE 1 TABLET BY MOUTH EVERY DAY 30 tablet 3  . omeprazole-sodium bicarbonate (ZEGERID) 40-1100 MG per capsule Take 1 capsule by mouth 2 (two) times daily. 60 capsule 4  . oxybutynin (DITROPAN-XL) 10 MG 24 hr tablet Take 1 tablet by mouth every day 30 tablet 5  . ZETIA 10 MG tablet take 1 tablet by mouth every evening 30 tablet 0  . doxycycline (VIBRAMYCIN) 100 MG capsule   0  . predniSONE (DELTASONE) 10 MG tablet   0  . triamcinolone cream (KENALOG) 0.1 %   0   No facility-administered medications prior to visit.    Allergies:  Allergies  Allergen Reactions  . Cephalosporins Diarrhea  . Codeine Itching  . Latex  Itching  . Pravastatin     Myalgias  . Simvastatin     myalgias  . Sulfa Antibiotics Diarrhea    History   Social History  . Marital Status: Divorced    Spouse Name: N/A  . Number of Children: N/A  . Years of Education: N/A   Occupational History  . Not on file.   Social History Main Topics  . Smoking status: Never Smoker   . Smokeless tobacco: Never Used  . Alcohol Use: Yes     Comment: X2 PER WK  . Drug Use: No  . Sexual Activity: Not on file   Other Topics Concern  . Not on file   Social History Narrative    Family History  Problem Relation Age of Onset  . Cancer Sister     pancreatic  . Diabetes Brother      Review of Systems:  See HPI for pertinent ROS. All other ROS negative.    Physical Exam: Blood pressure 110/60, pulse 60, temperature 98.1 F (36.7 C), temperature source Oral, resp. rate 16, weight 196 lb (88.905 kg)., Body mass index is 31.65 kg/(m^2). General: Obese white female. Appears in no acute distress. Neck: Supple. No thyromegaly. No lymphadenopathy.No carotid bruits. Lungs: Clear bilaterally to auscultation without wheezes, rales, or rhonchi. Breathing is unlabored. Heart: RRR with S1 S2. No murmurs, rubs, or gallops. Abdomen: Soft, non-tender, non-distended with normoactive bowel sounds. No hepatomegaly. No rebound/guarding. No obvious abdominal masses. Musculoskeletal:  Strength and tone normal for age. Extremities/Skin: Warm and dry. No edema. Neuro: Alert and oriented X 3. Moves all extremities spontaneously. Gait is normal. CNII-XII grossly in tact. Psych:  Responds to questions appropriately with a normal affect.     ASSESSMENT AND PLAN:  59 y.o. year old female with   She is not fasting. She says that it would be much easier for her to go to the [place] at  [facility] to do fasting lab work there. I have written her an order slip.  Have given her an order for:  lipid panel CMET  Have written on the order for  them to fax the results to [place] [phone]. Follow-up these results and follow patient. Otherwise can wait 6-12 months for routine follow-up visit.   High cholesterol She is intolerant to statins. Continue Zetia and fish oil.   Overactive bladder Controlled with oxybutynin and Myrbetriq  3. Obesity Have discussed diet and exercise multiple times.See HPI regarding weight loss at [facility] 08/23/2014   4. Anxiety Continue current dose of Cymbalta. Cont Xanax PRN  5. Depression Continue current dose of Cymbalta  6. GERD (gastroesophageal reflux disease)  7. Osteoarthritis of multiple joints  8. HSV-2  infection  9. OA (osteoarthritis) of knee  10. Barrett's esophagus with esophagitis Per GI  11. Arthritis  She had a complete physical exam 06/04/2011. All preventive care was updated at that time.  Colonoscopy:  6 /15/2007. Negative. Repeat 10 years. Screening labs: Performed 06/04/11. All were excellent. Immunizations:Tdap  Given here 06/04/2011 Pap: Done here 06/04/11 --Cytology was normal. HPV was not detected.  [address] [place], [place], [facility] 08/23/2014 3:43 PM

## 2014-09-03 ENCOUNTER — Other Ambulatory Visit: Payer: Self-pay | Admitting: Physician Assistant

## 2014-09-03 LAB — COMPREHENSIVE METABOLIC PANEL
ALK PHOS: 83 U/L (ref 33–130)
ALT: 15 U/L (ref 6–29)
AST: 14 U/L (ref 10–35)
Albumin: 4.5 g/dL (ref 3.6–5.1)
BUN: 13 mg/dL (ref 7–25)
CALCIUM: 9.5 mg/dL (ref 8.6–10.4)
CO2: 27 mmol/L (ref 20–31)
Chloride: 104 mmol/L (ref 98–110)
Creat: 0.89 mg/dL (ref 0.50–1.05)
GLUCOSE: 99 mg/dL (ref 70–99)
POTASSIUM: 4.4 mmol/L (ref 3.5–5.3)
Sodium: 140 mmol/L (ref 135–146)
Total Bilirubin: 0.4 mg/dL (ref 0.2–1.2)
Total Protein: 7 g/dL (ref 6.1–8.1)

## 2014-09-03 LAB — LIPID PANEL
CHOL/HDL RATIO: 3.5 ratio (ref <=5.0)
Cholesterol: 204 mg/dL — ABNORMAL HIGH (ref 125–200)
HDL: 59 mg/dL (ref >=46)
LDL CALC: 126 mg/dL (ref <130)
Triglycerides: 93 mg/dL (ref <150)
VLDL: 19 mg/dL (ref <30)

## 2014-09-09 ENCOUNTER — Telehealth: Payer: Self-pay | Admitting: Family Medicine

## 2014-09-09 MED ORDER — METOCLOPRAMIDE HCL 10 MG PO TABS: 10.0000 mg | ORAL_TABLET | Freq: Three times a day (TID) | ORAL | Status: DC

## 2014-09-09 MED ORDER — OXYBUTYNIN CHLORIDE ER 10 MG PO TB24: 10.0000 mg | ORAL_TABLET | Freq: Every day | ORAL | Status: DC

## 2014-09-09 MED ORDER — ACYCLOVIR 400 MG PO TABS: 400.0000 mg | ORAL_TABLET | Freq: Two times a day (BID) | ORAL | Status: DC

## 2014-09-09 MED ORDER — MIRABEGRON ER 25 MG PO TB24: 25.0000 mg | ORAL_TABLET | Freq: Every day | ORAL | Status: DC

## 2014-09-09 MED ORDER — DULOXETINE HCL 60 MG PO CPEP: 120.0000 mg | ORAL_CAPSULE | Freq: Every day | ORAL | Status: DC

## 2014-09-09 MED ORDER — EZETIMIBE 10 MG PO TABS: 10.0000 mg | ORAL_TABLET | Freq: Every evening | ORAL | Status: DC

## 2014-09-09 MED ORDER — MELOXICAM 15 MG PO TABS
15.0000 mg | ORAL_TABLET | Freq: Every day | ORAL | Status: DC
Start: 2014-09-09 — End: 2014-12-12

## 2014-09-09 NOTE — Telephone Encounter (Signed)
-----   Message from Kristi Sheldon, PA-C sent at 09/06/2014 12:00 PM EDT ----- Labs look good. Continue current medications. Patient had recently done a weight loss program.We were interested to see if this affected her cholesterol readings.  Tell her that her triglycerides have decreased with this.

## 2014-09-09 NOTE — Telephone Encounter (Signed)
Pt aware of lab results and provider recommendations.  Med refills sent

## 2014-09-23 ENCOUNTER — Telehealth: Payer: Self-pay | Admitting: Physician Assistant

## 2014-09-23 MED ORDER — FLUTICASONE PROPIONATE 50 MCG/ACT NA SUSP: 1.0000 | Freq: Every day | NASAL | Status: DC

## 2014-09-23 NOTE — Telephone Encounter (Signed)
Patient is calling for rx for flonase 90 day supply if possible  [facility]  [phone]

## 2014-09-23 NOTE — Telephone Encounter (Signed)
Medication refilled per protocol. 

## 2014-11-30 LAB — HM MAMMOGRAPHY

## 2014-12-05 IMAGING — US US EXTREM LOW VENOUS*L*
1 series · 14 of 24 positions shown · non-contrast
Comparison: None.

CLINICAL DATA: Left calf pain.

LEFT LOWER EXTREMITY VENOUS DUPLEX ULTRASOUND
TECHNIQUE: Gray-scale sonography with graded compression, as well
as color Doppler and duplex ultrasound were performed to evaluate
the deep venous system of the lower extremity from the level of the
common femoral vein through the popliteal and proximal calf veins.
Spectral Doppler was utilized to evaluate flow at rest and with
distal augmentation maneuvers.

[Series 1: us extrem low venous*left* · 14 of 39 slices shown]
[im 1/39]
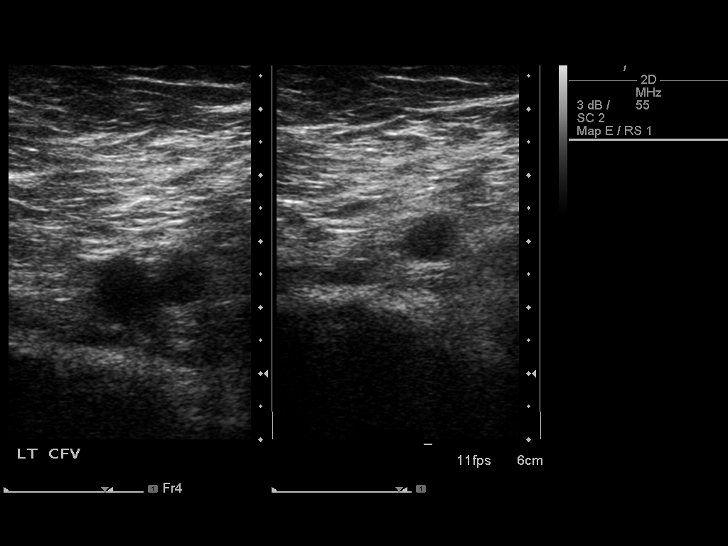
[im 4/39]
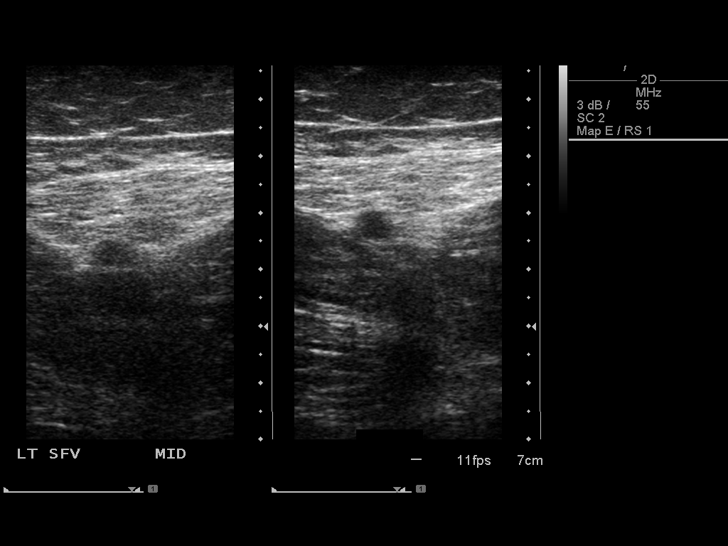
[im 7/39]
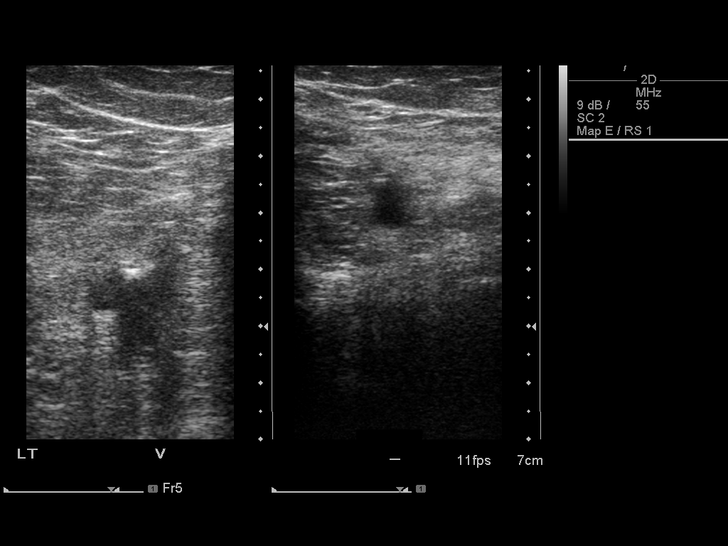
[im 10/39]
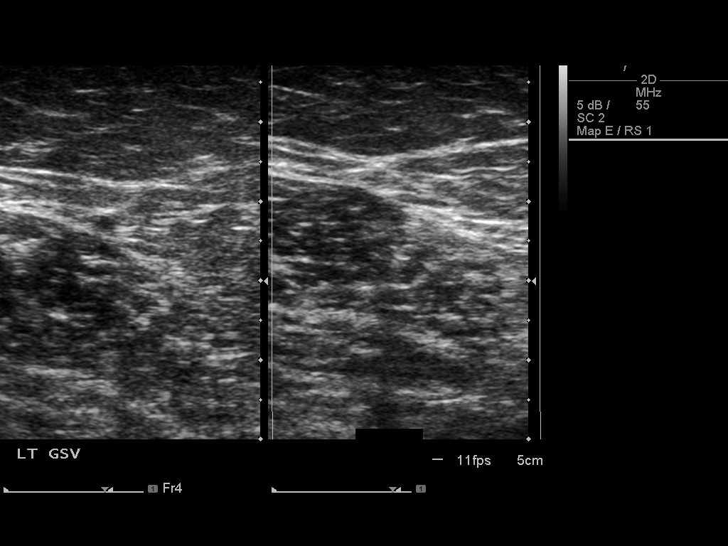
[im 12/39]
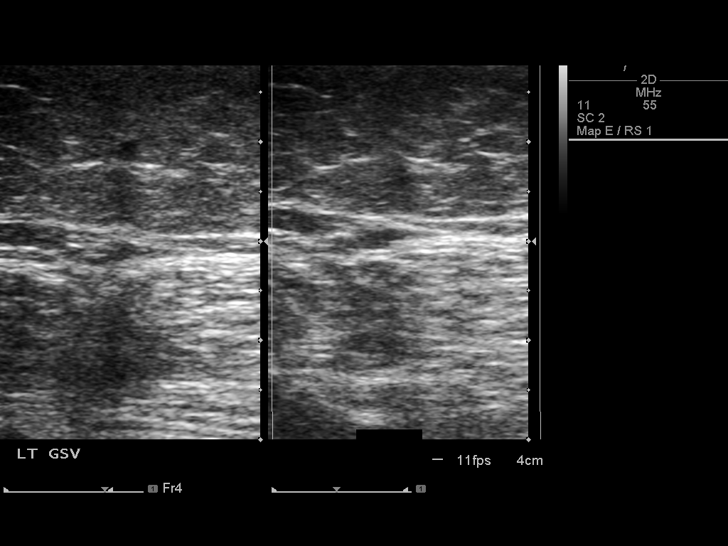
[im 15/39]
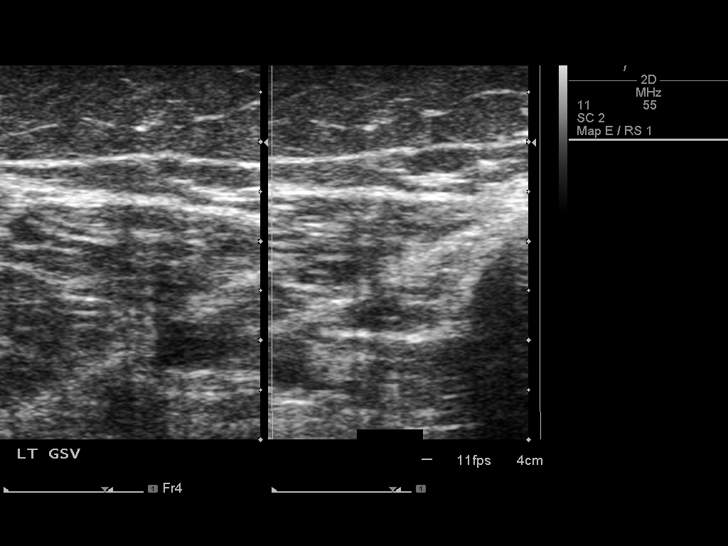
[im 19/39]
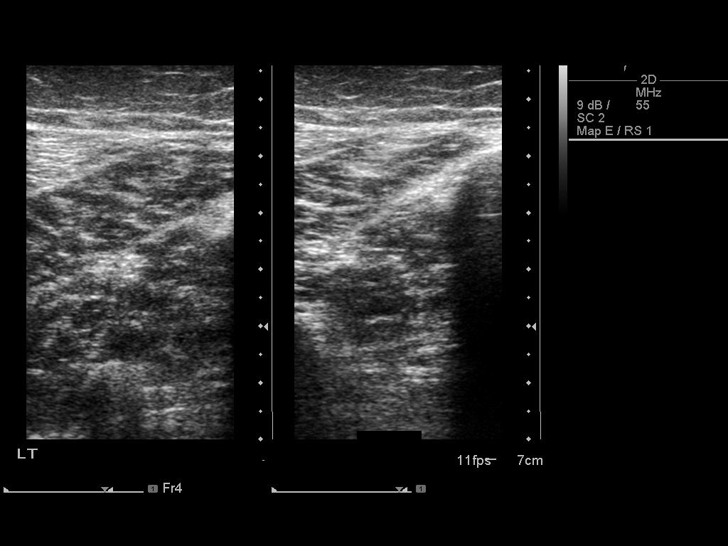
[im 20/39]
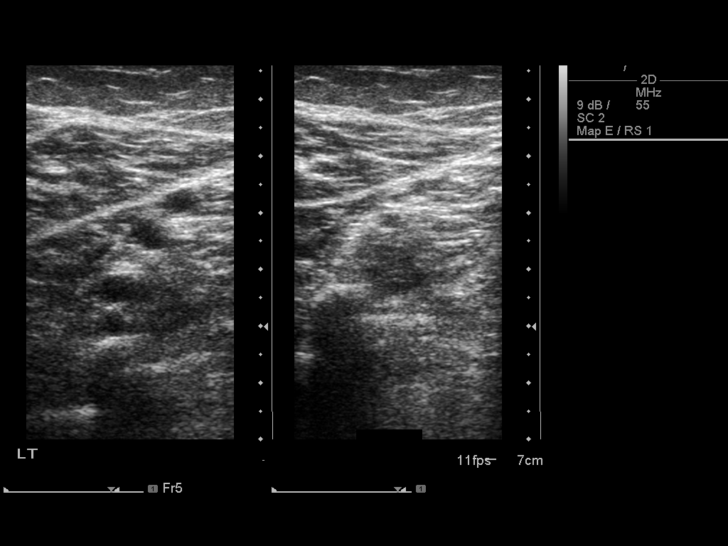
[im 24/39]
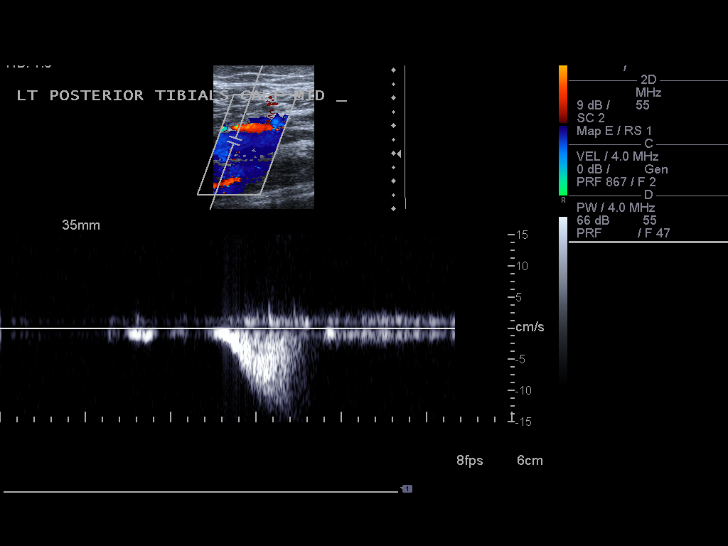
[im 27/39]
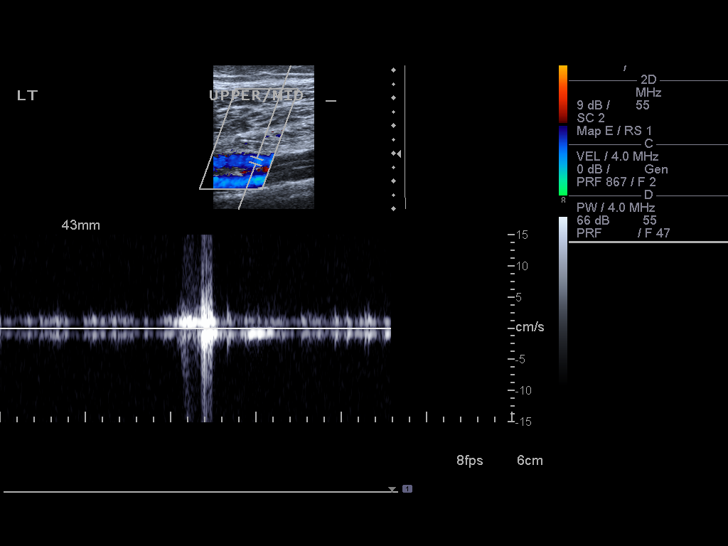
[im 30/39]
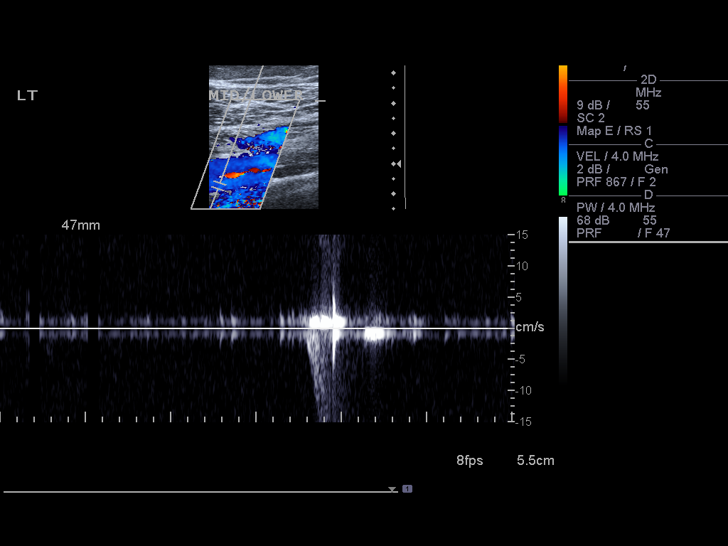
[im 32/39]
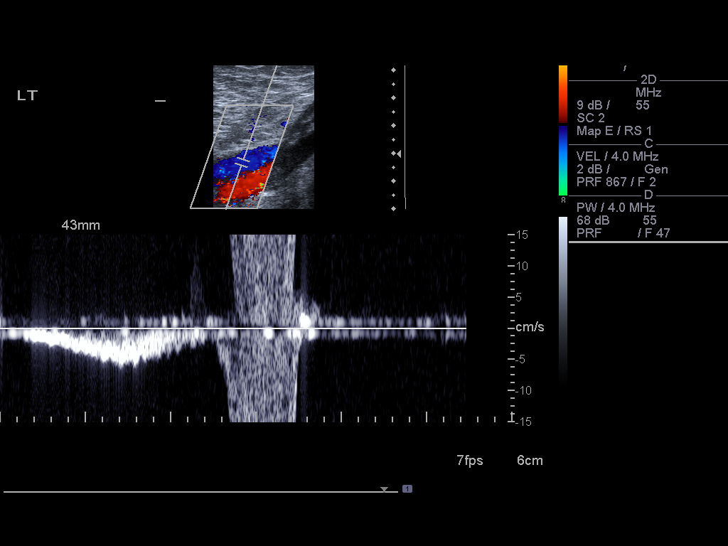
[im 35/39]
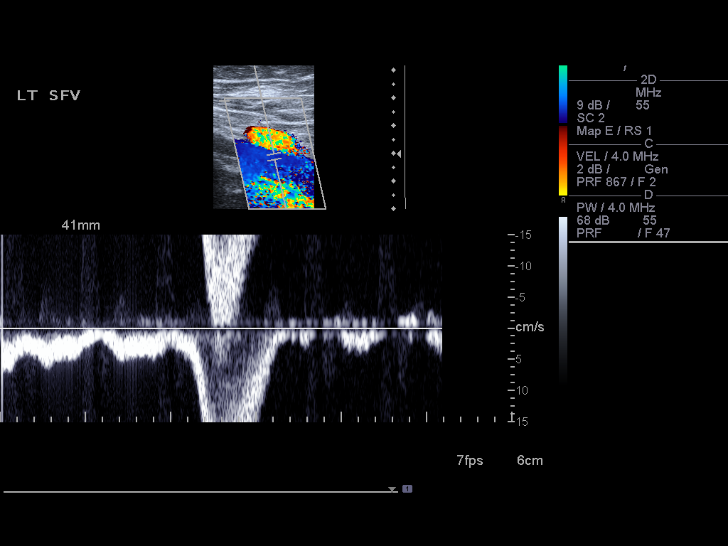
[im 39/39]
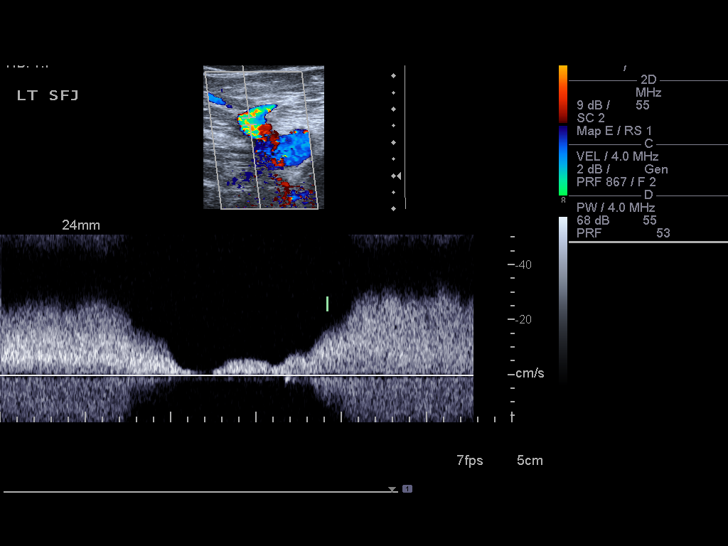

[14 of 24 positions shown; findings below may reference images not displayed]

FINDINGS: Normal compressibility of the common femoral,
superficial femoral, and popliteal veins is demonstrated, as well
as the visualized proximal calf veins.  No filling defects to
suggest DVT on grayscale or color Doppler imaging.  Doppler
waveforms show normal direction of venous flow, normal respiratory
phasicity and response to augmentation.
IMPRESSION: No evidence of lower extremity deep vein thrombosis.

## 2014-12-12 ENCOUNTER — Other Ambulatory Visit: Payer: Self-pay | Admitting: Physician Assistant

## 2014-12-13 NOTE — Telephone Encounter (Signed)
Medication refilled per protocol. 

## 2015-01-16 HISTORY — PX: COLONOSCOPY: SHX174

## 2015-03-21 ENCOUNTER — Encounter: Payer: Self-pay | Admitting: Family Medicine

## 2015-03-21 ENCOUNTER — Other Ambulatory Visit: Payer: Self-pay | Admitting: Family Medicine

## 2015-03-21 MED ORDER — OMEPRAZOLE-SODIUM BICARBONATE 40-1100 MG PO CAPS: 1.0000 | ORAL_CAPSULE | Freq: Two times a day (BID) | ORAL | Status: DC

## 2015-03-21 NOTE — Telephone Encounter (Signed)
Medication refill for one time only.  Patient needs to be seen.  Letter sent for patient to call and schedule 

## 2015-03-31 ENCOUNTER — Ambulatory Visit (INDEPENDENT_AMBULATORY_CARE_PROVIDER_SITE_OTHER): Payer: BLUE CROSS/BLUE SHIELD | Admitting: Physician Assistant

## 2015-03-31 ENCOUNTER — Encounter: Payer: Self-pay | Admitting: Physician Assistant

## 2015-03-31 VITALS — BP 122/78 | HR 60 | Temp 97.8°F | Resp 18 | Wt 207.0 lb

## 2015-03-31 DIAGNOSIS — N3281 Overactive bladder: Secondary | ICD-10-CM

## 2015-03-31 DIAGNOSIS — E7800 Pure hypercholesterolemia, unspecified: Secondary | ICD-10-CM | POA: Diagnosis not present

## 2015-03-31 DIAGNOSIS — E669 Obesity, unspecified: Secondary | ICD-10-CM

## 2015-03-31 DIAGNOSIS — K209 Esophagitis, unspecified without bleeding: Secondary | ICD-10-CM

## 2015-03-31 DIAGNOSIS — F32A Depression, unspecified: Secondary | ICD-10-CM

## 2015-03-31 DIAGNOSIS — F329 Major depressive disorder, single episode, unspecified: Secondary | ICD-10-CM | POA: Diagnosis not present

## 2015-03-31 DIAGNOSIS — B009 Herpesviral infection, unspecified: Secondary | ICD-10-CM

## 2015-03-31 DIAGNOSIS — M1990 Unspecified osteoarthritis, unspecified site: Secondary | ICD-10-CM | POA: Diagnosis not present

## 2015-03-31 DIAGNOSIS — D62 Acute posthemorrhagic anemia: Secondary | ICD-10-CM | POA: Diagnosis not present

## 2015-03-31 DIAGNOSIS — K219 Gastro-esophageal reflux disease without esophagitis: Secondary | ICD-10-CM

## 2015-03-31 DIAGNOSIS — M159 Polyosteoarthritis, unspecified: Secondary | ICD-10-CM

## 2015-03-31 DIAGNOSIS — M170 Bilateral primary osteoarthritis of knee: Secondary | ICD-10-CM | POA: Diagnosis not present

## 2015-03-31 DIAGNOSIS — F419 Anxiety disorder, unspecified: Secondary | ICD-10-CM

## 2015-03-31 DIAGNOSIS — K2270 Barrett's esophagus without dysplasia: Secondary | ICD-10-CM

## 2015-03-31 MED ORDER — ALPRAZOLAM 0.25 MG PO TABS: 0.2500 mg | ORAL_TABLET | Freq: Every day | ORAL | Status: DC

## 2015-03-31 MED ORDER — MIRABEGRON ER 50 MG PO TB24: 50.0000 mg | ORAL_TABLET | Freq: Every day | ORAL | Status: DC

## 2015-03-31 NOTE — Progress Notes (Signed)
Patient ID: PAYGE EPPES MRN: [id number], DOB: Jul 02, 1955, 60 y.o. Date of Encounter: @DATE @  Chief Complaint:  Chief Complaint  Patient presents with  . med refills    refill xanax    HPI: 60 y.o. year old female  presents for routine follow-up office visit.   She sees the following medical providers on a routine basis/recently: Dr. 41 at [facility] for her knees --she recently had Left TKR and now is scheduled for Right TKR Dr. McDirmiad, Urologist Says that she sees a dermatologist once a year Also sees gynecologist Dr. [facility] says she has seen GI in [facility] but only for EGD and colonoscopy  Hyperlipidemia:   simvastatin and pravastatin caused significant myalgias. She is now on Zetia and tolerating this. She's currently taking over-the-counter fish oil.  OAB: 08/2014 OV :  She sees Urology. She is on both Myrbetriq and oxybutynin.           03/2015 OV---She states that her gynecologist recommended her stop the oxybutynin. Says that they also discussed possibly increasing dose of Myrbetrique. She states that she is waking up multiple times                                during the night urinating. Is interested in increasing the dose of Myrbetrique.  Anxiety/depression she is  still on the Cymbalta 60 mg 2 tablet daily. This continues to control her anxiety and depression and her mood is stable on medication.She has no adverse effects to this medication.               She says that she rarely uses Xanax.                 03/2015--Requesting refill on Xanax. Says that she only uses 2 or 3 per month.  She had 2nd knee replacement in 2016. Says this second surgery went better than the first.  Weight Management: 08/2014---She reported that she did a weight loss program for 12 weeks. Says that included some supplements as well as a very strict diet for 12 weeks. Says that she completed that 12 week treatment one month ago. Says that she is still on some maintenance  supplement and trying to eat healthy and keep maintain her weight loss. Weight at office visit 02/15/2014 was 222. Weight today 08/23/2014 is 196. Lost 26 pounds. 03/2015----she reports that she is now doing weight watchers. Says that she started gaining the weight back and is in Weight Watchers now to try to help maintain this.  Past Medical History  Diagnosis Date  . Reflux   . High cholesterol   . Overactive bladder   . Obesity   . Anxiety   . Depression   . Arthritis   . HSV-2 infection     anal  . GERD (gastroesophageal reflux disease)     h/o peptic ulcer  . Frequency of urination   . Urgency of urination   . Nocturia   . Barrett's esophagus with esophagitis 07/23/12    EGD: CLO test results negative. pathology: [facility]: Repeat EGD 3 years  . PONV (postoperative nausea and vomiting) 09-22-12    felt weak for 6 months after surgery, had spinal  . Family history of anesthesia complication     mother trouble waking up  . Shortness of breath     last 6 months     Home Meds:  Outpatient Prescriptions Prior  to Visit  Medication Sig Dispense Refill  . acyclovir (ZOVIRAX) 400 MG tablet Take 1 tablet (400 mg total) by mouth 2 (two) times daily. 180 tablet 1  . ALPRAZolam (XANAX) 0.25 MG tablet Take 1 tablet (0.25 mg total) by mouth at bedtime. 30 tablet 2  . docusate sodium (COLACE) 100 MG capsule Take 100 mg by mouth 2 (two) times daily.     . DULoxetine (CYMBALTA) 60 MG capsule Take 2 capsules (120 mg total) by mouth daily. 180 capsule 1  . ezetimibe (ZETIA) 10 MG tablet Take 1 tablet (10 mg total) by mouth every evening. 90 tablet 1  . fluticasone (FLONASE) 50 MCG/ACT nasal spray Place 1 spray into both nostrils daily. 48 g 3  . Lidocaine-Hydrocortisone Ace 3-2.5 % KIT Place rectally 2 (two) times daily as needed (only with flare ups).    . meloxicam (MOBIC) 15 MG tablet TAKE 1 TABLET BY MOUTH DAILY 90 tablet 1  . metoCLOPramide (REGLAN) 10 MG tablet Take 1 tablet (10 mg total) by  mouth 3 (three) times daily before meals. 270 tablet 1  . mirabegron ER (MYRBETRIQ) 25 MG TB24 tablet Take 1 tablet (25 mg total) by mouth daily. 90 tablet 1  . omeprazole-sodium bicarbonate (ZEGERID) 40-1100 MG capsule Take 1 capsule by mouth 2 (two) times daily. 60 capsule 0  . oxybutynin (DITROPAN-XL) 10 MG 24 hr tablet Take 1 tablet (10 mg total) by mouth daily. (Patient not taking: Reported on 03/31/2015) 90 tablet 1   No facility-administered medications prior to visit.    Allergies:  Allergies  Allergen Reactions  . Cephalosporins Diarrhea  . Codeine Itching  . Latex Itching  . Pravastatin     Myalgias  . Simvastatin     myalgias  . Sulfa Antibiotics Diarrhea    Social History   Social History  . Marital Status: Divorced    Spouse Name: N/A  . Number of Children: N/A  . Years of Education: N/A   Occupational History  . Not on file.   Social History Main Topics  . Smoking status: Never Smoker   . Smokeless tobacco: Never Used  . Alcohol Use: Yes     Comment: X2 PER WK  . Drug Use: No  . Sexual Activity: Not on file   Other Topics Concern  . Not on file   Social History Narrative    Family History  Problem Relation Age of Onset  . Cancer Sister     pancreatic  . Diabetes Brother      Review of Systems:  See HPI for pertinent ROS. All other ROS negative.    Physical Exam: Blood pressure 122/78, pulse 60, temperature 97.8 F (36.6 C), temperature source Oral, resp. rate 18, weight 207 lb (93.895 kg)., Body mass index is 33.43 kg/(m^2). General: Obese white female. Appears in no acute distress. Neck: Supple. No thyromegaly. No lymphadenopathy.No carotid bruits. Lungs: Clear bilaterally to auscultation without wheezes, rales, or rhonchi. Breathing is unlabored. Heart: RRR with S1 S2. No murmurs, rubs, or gallops. Abdomen: Soft, non-tender, non-distended with normoactive bowel sounds. No hepatomegaly. No rebound/guarding. No obvious abdominal  masses. Musculoskeletal:  Strength and tone normal for age. Extremities/Skin: Warm and dry. No edema. Neuro: Alert and oriented X 3. Moves all extremities spontaneously. Gait is normal. CNII-XII grossly in tact. Psych:  Responds to questions appropriately with a normal affect.     ASSESSMENT AND PLAN:  60 y.o. year old female with    High cholesterol She is intolerant  to statins. Continue Zetia and fish oil. At [place] 08/2014--gave her a lab slip and she went to [place] on [address] for labs which is more convenient for her.   Overactive bladder At [facility] 03/2015--Increase dose of Myrbetriq to '50mg'$ .                         --Now off oxybutynin  3. Obesity Have discussed diet and exercise multiple times. At [place] 08/23/2014 she reported she had done intense 12 week weight loss program At OV 03/2015---she is now doing Weight Watchers to help maintain weight loss.  4. Anxiety Continue current dose of Cymbalta. Cont Xanax PRN--At [facility] 03/2015--Rx printed # 30 + 2  5. Depression Continue current dose of Cymbalta  6. GERD (gastroesophageal reflux disease) Stable/controlled with current treatment.  7. Osteoarthritis of multiple joints She has had knee replacements bilaterally.  8. HSV-2 infection  9. OA (osteoarthritis) of knee  10. Barrett's esophagus with esophagitis Per GI  11. Arthritis  She had a complete physical exam 06/04/2011. All preventive care was updated at that time.  Colonoscopy:  6 /15/2007. Negative. Repeat 10 years.-- AT [facility] 03/2015---TOLD HER COLONOSCOPY DUE THIS YEAR---she is to call GI to schedule f/u with them--Pt aware.  Screening labs: Performed 06/04/11. All were excellent. Immunizations:Tdap  Given here 06/04/2011 Pap: Done here 06/04/11 --Cytology was normal. HPV was not detected.   F/U OV 6 months, sooner if needed.   Marin Olp Dexter, [place], [facility] 03/31/2015 3:17 PM

## 2015-04-10 ENCOUNTER — Other Ambulatory Visit: Payer: Self-pay | Admitting: Physician Assistant

## 2015-04-11 ENCOUNTER — Encounter: Payer: Self-pay | Admitting: Family Medicine

## 2015-04-11 NOTE — Telephone Encounter (Signed)
Medication refill for one time only.  Patient needs to be seen.  Letter sent for patient to call and schedule 

## 2015-05-17 ENCOUNTER — Encounter: Payer: Self-pay | Admitting: Gastroenterology

## 2015-05-29 ENCOUNTER — Other Ambulatory Visit: Payer: Self-pay | Admitting: Physician Assistant

## 2015-05-30 ENCOUNTER — Encounter: Payer: Self-pay | Admitting: Family Medicine

## 2015-05-30 NOTE — Telephone Encounter (Signed)
Medication refill for one time only.  Patient needs to be seen.  Letter sent for patient to call and schedule 

## 2015-05-31 ENCOUNTER — Telehealth: Payer: Self-pay | Admitting: Physician Assistant

## 2015-05-31 NOTE — Telephone Encounter (Signed)
Pt left a VM very upset that she is receiving letters from [place] stating that she can not have a medication refill until she comes in for an OV. She was in on 3/16 to see MB Dixon. She would like a call back regarding this matter as soon as possible [phone]

## 2015-06-01 NOTE — Telephone Encounter (Signed)
Pt called issue resolved

## 2015-06-02 DIAGNOSIS — Z96653 Presence of artificial knee joint, bilateral: Secondary | ICD-10-CM | POA: Diagnosis not present

## 2015-06-02 DIAGNOSIS — M25512 Pain in left shoulder: Secondary | ICD-10-CM | POA: Diagnosis not present

## 2015-06-02 DIAGNOSIS — Z471 Aftercare following joint replacement surgery: Secondary | ICD-10-CM | POA: Diagnosis not present

## 2015-06-14 DIAGNOSIS — R351 Nocturia: Secondary | ICD-10-CM | POA: Diagnosis not present

## 2015-06-14 DIAGNOSIS — Z0000 Encounter for general adult medical examination without abnormal findings: Secondary | ICD-10-CM | POA: Diagnosis not present

## 2015-06-14 DIAGNOSIS — R350 Frequency of micturition: Secondary | ICD-10-CM | POA: Diagnosis not present

## 2015-06-14 DIAGNOSIS — N3281 Overactive bladder: Secondary | ICD-10-CM | POA: Diagnosis not present

## 2015-06-29 ENCOUNTER — Other Ambulatory Visit: Payer: Self-pay | Admitting: Physician Assistant

## 2015-06-29 NOTE — Telephone Encounter (Signed)
Refill appropriate and filled per protocol. 

## 2015-07-06 DIAGNOSIS — R351 Nocturia: Secondary | ICD-10-CM | POA: Diagnosis not present

## 2015-07-06 DIAGNOSIS — R350 Frequency of micturition: Secondary | ICD-10-CM | POA: Diagnosis not present

## 2015-07-24 ENCOUNTER — Other Ambulatory Visit: Payer: Self-pay | Admitting: Physician Assistant

## 2015-07-25 NOTE — Telephone Encounter (Signed)
Medication refilled per protocol. 

## 2015-08-10 ENCOUNTER — Encounter: Payer: Self-pay | Admitting: Gastroenterology

## 2015-08-11 DIAGNOSIS — R350 Frequency of micturition: Secondary | ICD-10-CM | POA: Diagnosis not present

## 2015-08-11 DIAGNOSIS — N393 Stress incontinence (female) (male): Secondary | ICD-10-CM | POA: Diagnosis not present

## 2015-08-28 ENCOUNTER — Other Ambulatory Visit: Payer: Self-pay | Admitting: Physician Assistant

## 2015-08-29 NOTE — Telephone Encounter (Signed)
Medication refill for one time only.  Patient needs to be seen.  Letter sent for patient to call and schedule 

## 2015-09-12 DIAGNOSIS — S92532A Displaced fracture of distal phalanx of left lesser toe(s), initial encounter for closed fracture: Secondary | ICD-10-CM | POA: Diagnosis not present

## 2015-09-12 DIAGNOSIS — S5002XA Contusion of left elbow, initial encounter: Secondary | ICD-10-CM | POA: Diagnosis not present

## 2015-09-13 ENCOUNTER — Ambulatory Visit (AMBULATORY_SURGERY_CENTER): Payer: Self-pay

## 2015-09-13 VITALS — Ht 66.5 in | Wt 191.6 lb

## 2015-09-13 DIAGNOSIS — Z1211 Encounter for screening for malignant neoplasm of colon: Secondary | ICD-10-CM

## 2015-09-13 MED ORDER — SUPREP BOWEL PREP KIT 17.5-3.13-1.6 GM/177ML PO SOLN: 1.0000 | Freq: Once | ORAL | 0 refills | Status: AC

## 2015-09-13 NOTE — Progress Notes (Signed)
No allergies to eggs or soy No past problems with anesthesia No home oxygen No diet meds  Has email and internet; declined emmi 

## 2015-09-14 ENCOUNTER — Encounter: Payer: Self-pay | Admitting: Gastroenterology

## 2015-09-20 ENCOUNTER — Telehealth: Payer: Self-pay | Admitting: Gastroenterology

## 2015-09-21 NOTE — Telephone Encounter (Signed)
Patient has [facility] and can use the $50 coupon.  I just sent some up on the dumbwaiter so just call the pharmacy and give them the numbers over the phone and then let patient know we were able to get it for $50 for her.  Make it sound like an great thing!

## 2015-09-21 NOTE — Telephone Encounter (Signed)
Levada Dy - patient has [employer].  Just call the pharmacy and give them

## 2015-09-27 ENCOUNTER — Encounter: Payer: Self-pay | Admitting: Gastroenterology

## 2015-09-27 ENCOUNTER — Ambulatory Visit (AMBULATORY_SURGERY_CENTER): Payer: BLUE CROSS/BLUE SHIELD | Admitting: Gastroenterology

## 2015-09-27 VITALS — BP 113/67 | HR 57 | Temp 97.1°F | Resp 9 | Ht 66.5 in | Wt 207.0 lb

## 2015-09-27 DIAGNOSIS — Z1211 Encounter for screening for malignant neoplasm of colon: Secondary | ICD-10-CM

## 2015-09-27 LAB — HM COLONOSCOPY

## 2015-09-27 MED ORDER — SODIUM CHLORIDE 0.9 % IV SOLN: 500.0000 mL | INTRAVENOUS | Status: DC

## 2015-09-27 NOTE — Progress Notes (Signed)
Patient awakening,vss,report to rn 

## 2015-09-27 NOTE — Patient Instructions (Signed)
Normal colonoscopy.  Repeat colonoscopy in 10 years.  Dr. Ardis Hughs office will contact will contact you about a new patient appointment to discuss mild constipation and GERD.  In the meantime try 1 scoop of powder citrucel a day as a supplement.    YOU HAD AN ENDOSCOPIC PROCEDURE TODAY AT [facility]:   Refer to the procedure report that was given to you for any specific questions about what was found during the examination.  If the procedure report does not answer your questions, please call your gastroenterologist to clarify.  If you requested that your care partner not be given the details of your procedure findings, then the procedure report has been included in a sealed envelope for you to review at your convenience later.  YOU SHOULD EXPECT: Some feelings of bloating in the abdomen. Passage of more gas than usual.  Walking can help get rid of the air that was put into your GI tract during the procedure and reduce the bloating. If you had a lower endoscopy (such as a colonoscopy or flexible sigmoidoscopy) you may notice spotting of blood in your stool or on the toilet paper. If you underwent a bowel prep for your procedure, you may not have a normal bowel movement for a few days.  Please Note:  You might notice some irritation and congestion in your nose or some drainage.  This is from the oxygen used during your procedure.  There is no need for concern and it should clear up in a day or so.  SYMPTOMS TO REPORT IMMEDIATELY:   Following lower endoscopy (colonoscopy or flexible sigmoidoscopy):  Excessive amounts of blood in the stool  Significant tenderness or worsening of abdominal pains  Swelling of the abdomen that is new, acute  Fever of 100F or higher   For urgent or emergent issues, a gastroenterologist can be reached at any hour by calling [phone].   DIET:  We do recommend a small meal at first, but then you may proceed to your regular diet.  Drink plenty of  fluids but you should avoid alcoholic beverages for 24 hours.  ACTIVITY:  You should plan to take it easy for the rest of today and you should NOT DRIVE or use heavy machinery until tomorrow (because of the sedation medicines used during the test).    FOLLOW UP: Our staff will call the number listed on your records the next business day following your procedure to check on you and address any questions or concerns that you may have regarding the information given to you following your procedure. If we do not reach you, we will leave a message.  However, if you are feeling well and you are not experiencing any problems, there is no need to return our call.  We will assume that you have returned to your regular daily activities without incident.  If any biopsies were taken you will be contacted by phone or by letter within the next 1-3 weeks.  Please call us at [phone] if you have not heard about the biopsies in 3 weeks.    SIGNATURES/CONFIDENTIALITY: You and/or your care partner have signed paperwork which will be entered into your electronic medical record.  These signatures attest to the fact that that the information above on your After Visit Summary has been reviewed and is understood.  Full responsibility of the confidentiality of this discharge information lies with you and/or your care-partner.

## 2015-09-27 NOTE — Op Note (Signed)
[place] Patient Name: Kristi Avila Procedure Date: 09/27/2015 8:21 AM MRN: [id number] Endoscopist: Milus Banister , MD Age: 60 Referring MD:  Date of Birth: 1955/05/09 Gender: Female Account #: [id number] Procedure:                Colonoscopy Indications:              Screening for colorectal malignant neoplasm Medicines:                Monitored Anesthesia Care Procedure:                Pre-Anesthesia Assessment:                           - Prior to the procedure, a History and Physical                            was performed, and patient medications and                            allergies were reviewed. The patient's tolerance of                            previous anesthesia was also reviewed. The risks                            and benefits of the procedure and the sedation                            options and risks were discussed with the patient.                            All questions were answered, and informed consent                            was obtained. Prior Anticoagulants: The patient has                            taken no previous anticoagulant or antiplatelet                            agents. ASA Grade Assessment: II - A patient with                            mild systemic disease. After reviewing the risks                            and benefits, the patient was deemed in                            satisfactory condition to undergo the procedure.                           After obtaining informed consent, the colonoscope  was passed under direct vision. Throughout the                            procedure, the patient's blood pressure, pulse, and                            oxygen saturations were monitored continuously. The                            Model CF-HQ190L [id number]) scope was introduced                            through the anus and advanced to the the cecum,                            identified by  appendiceal orifice and ileocecal                            valve. The colonoscopy was performed without                            difficulty. The patient tolerated the procedure                            well. The quality of the bowel preparation was                            good. The ileocecal valve, appendiceal orifice, and                            rectum were photographed. Scope In: 8:26:59 AM Scope Out: 8:39:03 AM Scope Withdrawal Time: 0 hours 8 minutes 9 seconds  Total Procedure Duration: 0 hours 12 minutes 4 seconds  Findings:                 The entire examined colon appeared normal on direct                            and retroflexion views. Complications:            No immediate complications. Estimated blood loss:                            None. Estimated Blood Loss:     Estimated blood loss: none. Impression:               - The entire examined colon is normal on direct and                            retroflexion views.                           - No polyps or cancers Recommendation:           - Patient has a contact number available for  emergencies. The signs and symptoms of potential                            delayed complications were discussed with the                            patient. Return to normal activities tomorrow.                            Written discharge instructions were provided to the                            patient.                           - Resume previous diet.                           - Continue present medications.                           - Repeat colonoscopy in 10 years for screening                            purposes.                           - Dr. Ardis Hughs' office will contact you about new                            patient appt to discuss mild constipation and GERD                            which you mentioned today. Please try one scoope of                            poweder citrucel fiber  supplement daily for now. Milus Banister, MD 09/27/2015 8:41:36 AM This report has been signed electronically.

## 2015-09-28 ENCOUNTER — Telehealth: Payer: Self-pay | Admitting: *Deleted

## 2015-09-28 NOTE — Telephone Encounter (Signed)
  Follow up Call-  Call back number 09/27/2015  Post procedure Call Back phone  # [phone]  Permission to leave phone message Yes  Some recent data might be hidden     Patient questions:  Do you have a fever, pain , or abdominal swelling? No. Pain Score  0 *  Have you tolerated food without any problems? Yes.    Have you been able to return to your normal activities? Yes.    Do you have any questions about your discharge instructions: Diet   No. Medications  No. Follow up visit  No.  Do you have questions or concerns about your Care? No.  Actions: * If pain score is 4 or above: No action needed, pain <4.

## 2015-10-05 DIAGNOSIS — L821 Other seborrheic keratosis: Secondary | ICD-10-CM | POA: Diagnosis not present

## 2015-10-05 DIAGNOSIS — L570 Actinic keratosis: Secondary | ICD-10-CM | POA: Diagnosis not present

## 2015-10-05 DIAGNOSIS — D225 Melanocytic nevi of trunk: Secondary | ICD-10-CM | POA: Diagnosis not present

## 2015-10-05 DIAGNOSIS — Z85828 Personal history of other malignant neoplasm of skin: Secondary | ICD-10-CM | POA: Diagnosis not present

## 2015-10-06 DIAGNOSIS — Z3A30 30 weeks gestation of pregnancy: Secondary | ICD-10-CM | POA: Diagnosis not present

## 2015-10-06 DIAGNOSIS — Z01419 Encounter for gynecological examination (general) (routine) without abnormal findings: Secondary | ICD-10-CM | POA: Diagnosis not present

## 2015-10-06 DIAGNOSIS — Z1151 Encounter for screening for human papillomavirus (HPV): Secondary | ICD-10-CM | POA: Diagnosis not present

## 2015-10-12 ENCOUNTER — Ambulatory Visit (INDEPENDENT_AMBULATORY_CARE_PROVIDER_SITE_OTHER): Payer: BLUE CROSS/BLUE SHIELD | Admitting: Physician Assistant

## 2015-10-12 ENCOUNTER — Encounter: Payer: Self-pay | Admitting: Physician Assistant

## 2015-10-12 VITALS — BP 110/68 | HR 76 | Temp 97.9°F | Resp 16 | Wt 187.0 lb

## 2015-10-12 DIAGNOSIS — E669 Obesity, unspecified: Secondary | ICD-10-CM

## 2015-10-12 DIAGNOSIS — K5900 Constipation, unspecified: Secondary | ICD-10-CM | POA: Insufficient documentation

## 2015-10-12 DIAGNOSIS — E7800 Pure hypercholesterolemia, unspecified: Secondary | ICD-10-CM | POA: Diagnosis not present

## 2015-10-12 DIAGNOSIS — F329 Major depressive disorder, single episode, unspecified: Secondary | ICD-10-CM | POA: Diagnosis not present

## 2015-10-12 DIAGNOSIS — K5909 Other constipation: Secondary | ICD-10-CM

## 2015-10-12 DIAGNOSIS — Z23 Encounter for immunization: Secondary | ICD-10-CM

## 2015-10-12 DIAGNOSIS — F32A Depression, unspecified: Secondary | ICD-10-CM

## 2015-10-12 DIAGNOSIS — F419 Anxiety disorder, unspecified: Secondary | ICD-10-CM

## 2015-10-12 MED ORDER — LINACLOTIDE 72 MCG PO CAPS: 72.0000 ug | ORAL_CAPSULE | Freq: Every day | ORAL | 2 refills | Status: DC

## 2015-10-12 NOTE — Progress Notes (Signed)
Patient ID: Kristi Avila MRN: [id number], DOB: 1955/12/17, 60 y.o. Date of Encounter: '@DATE'$ @  Chief Complaint:  Chief Complaint  Patient presents with  . Medication Management    HPI: 60 y.o. year old female  presents for routine follow-up office visit.   She sees the following medical providers on a routine basis/recently: Dr. McDirmiad, Urologist Says that she sees a dermatologist once a year Also sees gynecologist Dr. Ronita Hipps says she has seen GI in [facility] but only for EGD and colonoscopy She has had TKR by Dr. Maureen Ralphs in past--had one done then the other done at a later date  Hyperlipidemia:   simvastatin and pravastatin caused significant myalgias. She is now on Zetia and tolerating this. She's currently taking over-the-counter fish oil.  OAB: 08/2014 OV :  She sees Urology. She is on both Myrbetriq and oxybutynin.           03/2015 OV---She states that her gynecologist recommended her stop the oxybutynin. Says that they also discussed possibly increasing dose of Myrbetrique. She states that she is waking up multiple times                                during the night urinating. Is interested in increasing the dose of Myrbetrique.  Anxiety/depression she is  still on the Cymbalta 60 mg 2 tablet daily. This continues to control her anxiety and depression and her mood is stable on medication.She has no adverse effects to this medication.               She says that she rarely uses Xanax. Says that she only uses 2 or 3 per month.   Weight Management: 08/2014---She reported that she did a weight loss program for 12 weeks. Says that included some supplements as well as a very strict diet for 12 weeks. Says that she completed that 12 week treatment one month ago. Says that she is still on some maintenance supplement and trying to eat healthy and keep maintain her weight loss. Weight at office visit 02/15/2014 was 222. Weight today 08/23/2014 is 196. Lost 26 pounds. 03/2015----she  reports that she is now doing weight watchers. Says that she started gaining the weight back and is in Weight Watchers now to try to help maintain this. 10/12/2015--she reports that she is still doing weight watchers. Weight at office visit with me 03/2015 was 207. Weight today 187. Down 20 pounds !!!  --------------- she states that her niece who was in her 62s is going with her to weight watchers. 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 Says that she goes to Weight Watchers meeting every Tuesday night. Is at most --------------one meeting since her last visit with me. Says that she loves it. Says that she continued plans to continue going every Tuesday night forever. Says that she has made great friends there and they really --------------enjoying each other and exchanging recipes etc. Says that he keeps her very motivated. Says that she feels the best she has felt in 20 years.  She does look like she feels better today. Seems very energetic.  Says that she did go for her colonoscopy. Was told that she can wait to repeat 10 years.  Says that she is having to take 2 Colace daily for constipation. Says that even with eating all of these vegetables she is still having to do this. Is wondering if there is an alternative medicine  to use.  No additional complaints or concerns today.  Past Medical History:  Diagnosis Date  . Anxiety   . Arthritis   . Barrett's esophagus with esophagitis 07/23/12   EGD: CLO test results negative. pathology: Barretss: Repeat EGD 3 years  . Depression   . Family history of anesthesia complication    mother trouble waking up  . Frequency of urination   . GERD (gastroesophageal reflux disease)    h/o peptic ulcer  . High cholesterol   . HSV-2 infection    anal  . Nocturia   . Obesity   . Overactive bladder   . PONV (postoperative nausea and vomiting) 09-22-12   felt weak for 6 months after surgery, had spinal  . Reflux   . Shortness of breath    last 6 months  .  Urgency of urination      Home Meds:  Outpatient Medications Prior to Visit  Medication Sig Dispense Refill  . acyclovir (ZOVIRAX) 400 MG tablet Take 1 tablet (400 mg total) by mouth 2 (two) times daily. 180 tablet 1  . ALPRAZolam (XANAX) 0.25 MG tablet Take 1 tablet (0.25 mg total) by mouth at bedtime. 30 tablet 2  . docusate sodium (COLACE) 100 MG capsule Take 100 mg by mouth 2 (two) times daily.     . DULoxetine (CYMBALTA) 60 MG capsule Take 2 capsules (120 mg total) by mouth daily. 180 capsule 1  . fluticasone (FLONASE) 50 MCG/ACT nasal spray Place 1 spray into both nostrils daily. 48 g 3  . Lidocaine-Hydrocortisone Ace 3-2.5 % KIT Place rectally 2 (two) times daily as needed (only with flare ups).    . meloxicam (MOBIC) 15 MG tablet take 1 tablet by mouth once daily 90 tablet 0  . metoCLOPramide (REGLAN) 10 MG tablet TAKE 1 TABLET BY MOUTH 3 TIMES DAILY BEFORE MEALS 90 tablet 0  . Multiple Vitamin (MULTIVITAMIN) tablet Take 1 tablet by mouth daily.    Marland Kitchen omeprazole-sodium bicarbonate (ZEGERID) 40-1100 MG capsule take 1 capsule by mouth twice a day 60 capsule 0  . OVER THE COUNTER MEDICATION Vitamin B12    . OVER THE COUNTER MEDICATION Vitamin d 3    . solifenacin (VESICARE) 10 MG tablet Take by mouth daily.    Marland Kitchen ZETIA 10 MG tablet take 1 tablet by mouth every morning 90 tablet 0   Facility-Administered Medications Prior to Visit  Medication Dose Route Frequency Provider Last Rate Last Dose  . 0.9 %  sodium chloride infusion  500 mL Intravenous Continuous Milus Banister, MD        Allergies:  Allergies  Allergen Reactions  . Cephalosporins Diarrhea  . Codeine Itching  . Latex Itching  . Pravastatin     Myalgias  . Simvastatin     myalgias  . Sulfa Antibiotics Diarrhea    Social History   Social History  . Marital status: Divorced    Spouse name: N/A  . Number of children: N/A  . Years of education: N/A   Occupational History  . Not on file.   Social History Main  Topics  . Smoking status: Never Smoker  . Smokeless tobacco: Never Used  . Alcohol use 3.6 oz/week    6 Cans of beer per week     Comment: 6  . Drug use: No  . Sexual activity: Not on file   Other Topics Concern  . Not on file   Social History Narrative  . No narrative on file  Family History  Problem Relation Age of Onset  . Cancer Sister     pancreatic  . Diabetes Brother   . Colon cancer Neg Hx      Review of Systems:  See HPI for pertinent ROS. All other ROS negative.    Physical Exam: Blood pressure 110/68, pulse 76, temperature 97.9 F (36.6 C), resp. rate 16, weight 187 lb (84.8 kg)., Body mass index is 29.73 kg/m. General: Obese white female. Appears in no acute distress. Neck: Supple. No thyromegaly. No lymphadenopathy.No carotid bruits. Lungs: Clear bilaterally to auscultation without wheezes, rales, or rhonchi. Breathing is unlabored. Heart: RRR with S1 S2. No murmurs, rubs, or gallops. Abdomen: Soft, non-tender, non-distended with normoactive bowel sounds. No hepatomegaly. No rebound/guarding. No obvious abdominal masses. Musculoskeletal:  Strength and tone normal for age. Extremities/Skin: Warm and dry. No edema. Neuro: Alert and oriented X 3. Moves all extremities spontaneously. Gait is normal. CNII-XII grossly in tact. Psych:  Responds to questions appropriately with a normal affect.     ASSESSMENT AND PLAN:  60 y.o. year old female with   1. Chronic constipation --Linzess 72 mcg QD----Gave her 2 sample bottles of this for a total of 8 tablets. Told her to try these and let me know if this causes diarrhea or if this is not strong denies and does not totally control her constipation. Asked that there are higher doses that we can go to but given that she is currently needing 2 Colace a day feel that this dose would probably be appropriate for her. I did go ahead and give her the savings card so that she can get 90 pills for $30. Send prescription as well  as giving her some samples to try prior to picking up Rx.     High cholesterol She is intolerant to statins. Continue Zetia and fish oil. At [place] 09/2015--gave her a lab slip and she went to [place] on [address] for labs which is more convenient for her. She states that she has to be at work prior to the time of our office opens.   Overactive bladder At OV 03/2015--Increase dose of Myrbetriq to '50mg'$ .                         --Now off oxybutynin  3. Obesity Have discussed diet and exercise multiple times. At [place] 08/23/2014 she reported she had done intense 12 week weight loss program At OV 03/2015---she is now doing Weight Watchers to help maintain weight loss. At [facility] 09/2015--she is continuing to do Weight Watchers. Has lost 20 pounds since her visit with me 6 months ago-- 03/2015 4. Anxiety Continue current dose of Cymbalta. Cont Xanax PRN  5. Depression Continue current dose of Cymbalta  6. GERD (gastroesophageal reflux disease) Stable/controlled with current treatment.  7. Osteoarthritis of multiple joints She has had knee replacements bilaterally.  8. HSV-2 infection  9. OA (osteoarthritis) of knee  10. Barrett's esophagus with esophagitis Per GI  11. Arthritis  She had a complete physical exam 06/04/2011. All preventive care was updated at that time.  Colonoscopy:  6 /15/2007. Negative. Repeat 10 years.                        --- Visit 09/2015 she reports that she did have follow-up colonoscopy and was told that she can wait to repeat 10 years. Screening labs: Performed 06/04/11. All were excellent. Immunizations:Tdap  Given here 06/04/2011 Pap: Done  here 06/04/11 --Cytology was normal. HPV was not detected.   F/U OV 6 months, sooner if needed.   Marin Olp [place], [place], [facility] 10/12/2015 4:12 PM

## 2015-10-13 ENCOUNTER — Other Ambulatory Visit: Payer: Self-pay | Admitting: Physician Assistant

## 2015-10-13 DIAGNOSIS — F419 Anxiety disorder, unspecified: Secondary | ICD-10-CM

## 2015-10-13 NOTE — Telephone Encounter (Signed)
Ok to refill??  Last office visit 10/12/2015.  Last refill 03/31/2015, #2 refills.

## 2015-10-13 NOTE — Telephone Encounter (Signed)
Approved. #30+2. 

## 2015-10-13 NOTE — Telephone Encounter (Signed)
RX refilled per protocol 

## 2015-10-19 ENCOUNTER — Other Ambulatory Visit: Payer: Self-pay | Admitting: Physician Assistant

## 2015-11-20 ENCOUNTER — Other Ambulatory Visit: Payer: Self-pay | Admitting: Physician Assistant

## 2015-11-21 NOTE — Telephone Encounter (Signed)
Rx refilled per protocol 

## 2015-12-06 ENCOUNTER — Other Ambulatory Visit: Payer: Self-pay | Admitting: Physician Assistant

## 2015-12-06 DIAGNOSIS — E785 Hyperlipidemia, unspecified: Secondary | ICD-10-CM | POA: Diagnosis not present

## 2015-12-06 LAB — COMPREHENSIVE METABOLIC PANEL
ALBUMIN: 4.4 g/dL (ref 3.6–5.1)
ALK PHOS: 56 U/L (ref 33–130)
ALT: 17 U/L (ref 6–29)
AST: 15 U/L (ref 10–35)
BILIRUBIN TOTAL: 0.5 mg/dL (ref 0.2–1.2)
BUN: 14 mg/dL (ref 7–25)
CALCIUM: 9.3 mg/dL (ref 8.6–10.4)
CO2: 28 mmol/L (ref 20–31)
Chloride: 107 mmol/L (ref 98–110)
Creat: 0.85 mg/dL (ref 0.50–1.05)
Glucose, Bld: 95 mg/dL (ref 70–99)
POTASSIUM: 4.3 mmol/L (ref 3.5–5.3)
Sodium: 141 mmol/L (ref 135–146)
TOTAL PROTEIN: 6.4 g/dL (ref 6.1–8.1)

## 2015-12-06 LAB — LIPID PANEL
CHOL/HDL RATIO: 2.7 ratio (ref <5.0)
CHOLESTEROL: 185 mg/dL (ref <200)
HDL: 69 mg/dL (ref >50)
LDL Cholesterol: 98 mg/dL (ref <100)
TRIGLYCERIDES: 89 mg/dL (ref <150)
VLDL: 18 mg/dL (ref <30)

## 2015-12-11 IMAGING — CR DG CHEST 2V
2 series · 2 of 2 positions shown · non-contrast
Comparison: Chest radiograph 10/07/2007.

CLINICAL DATA: Shortness of breath

EXAM:
CHEST  2 VIEW

[w chest pa]
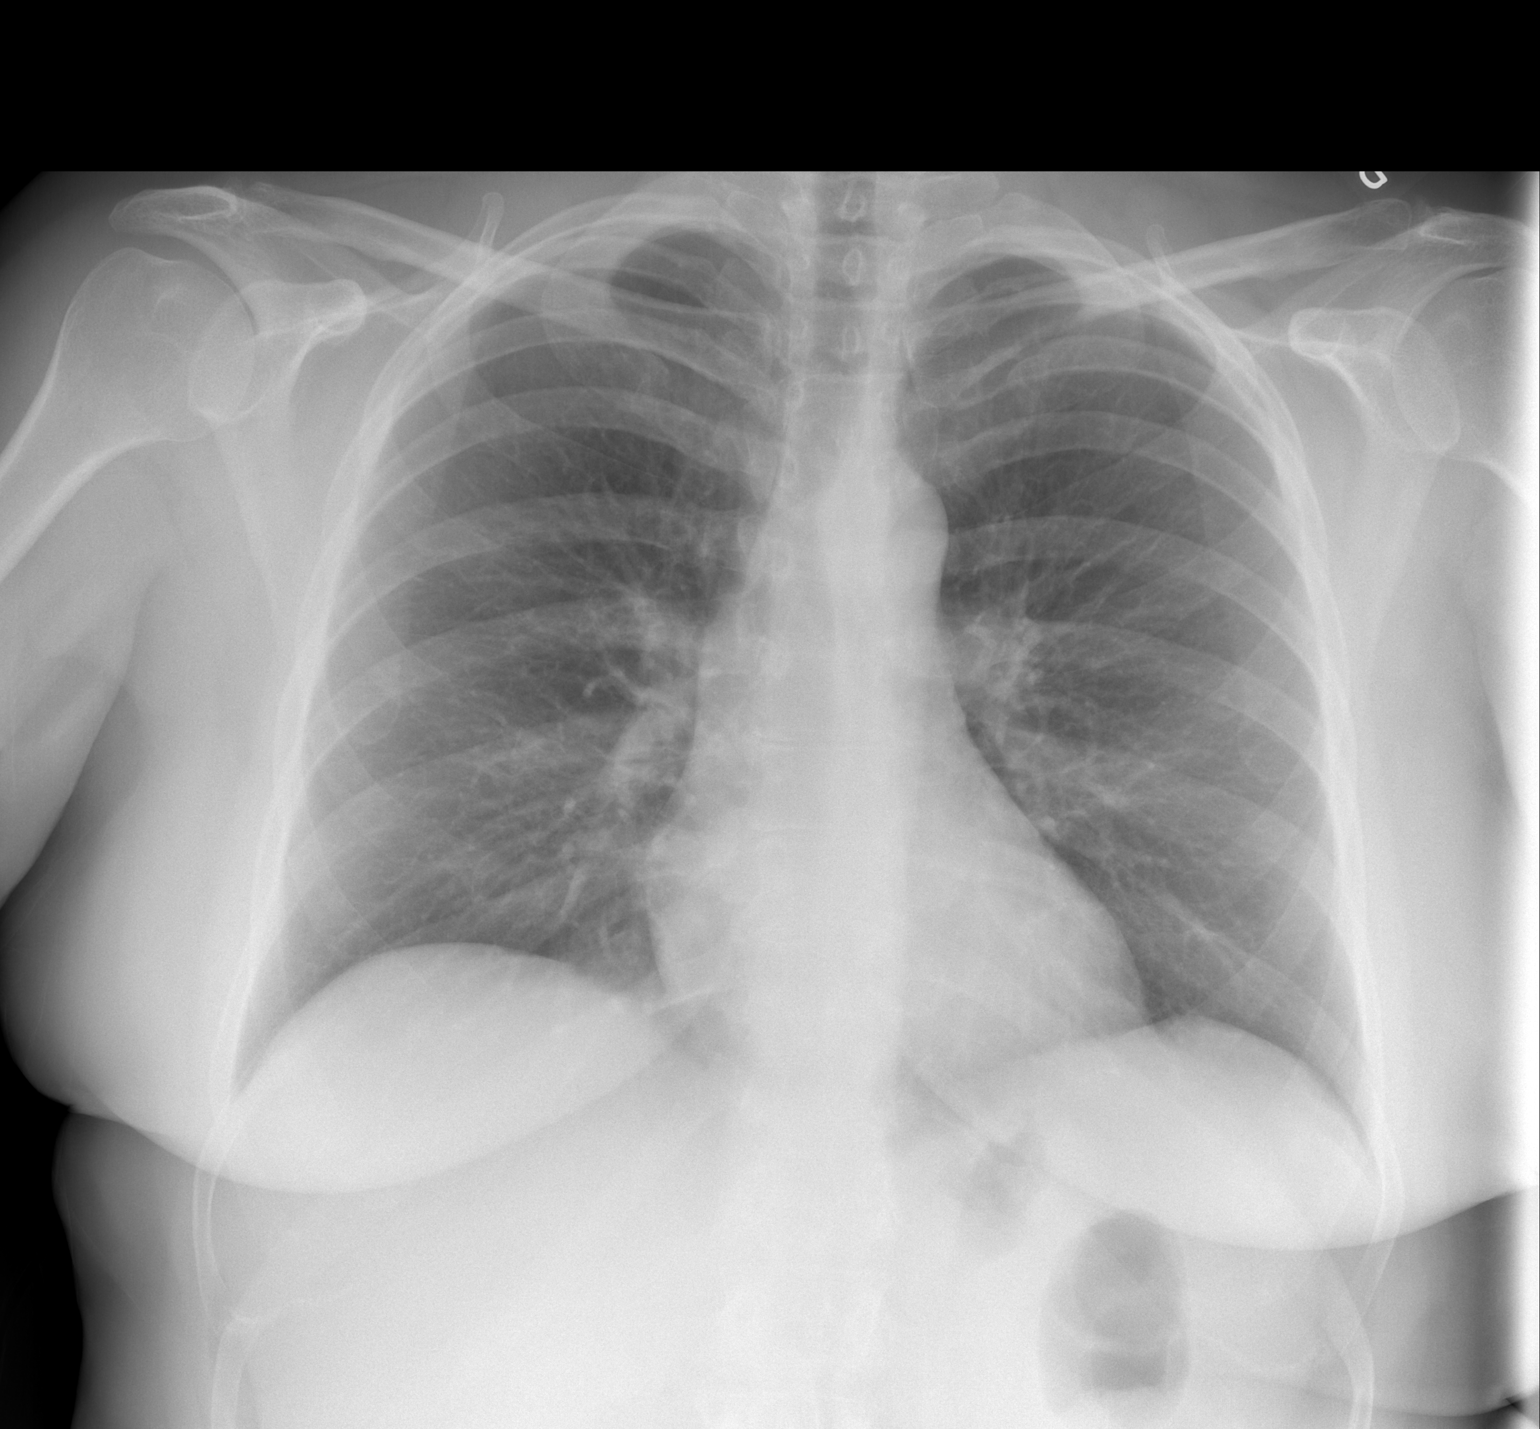

[w chest lat]
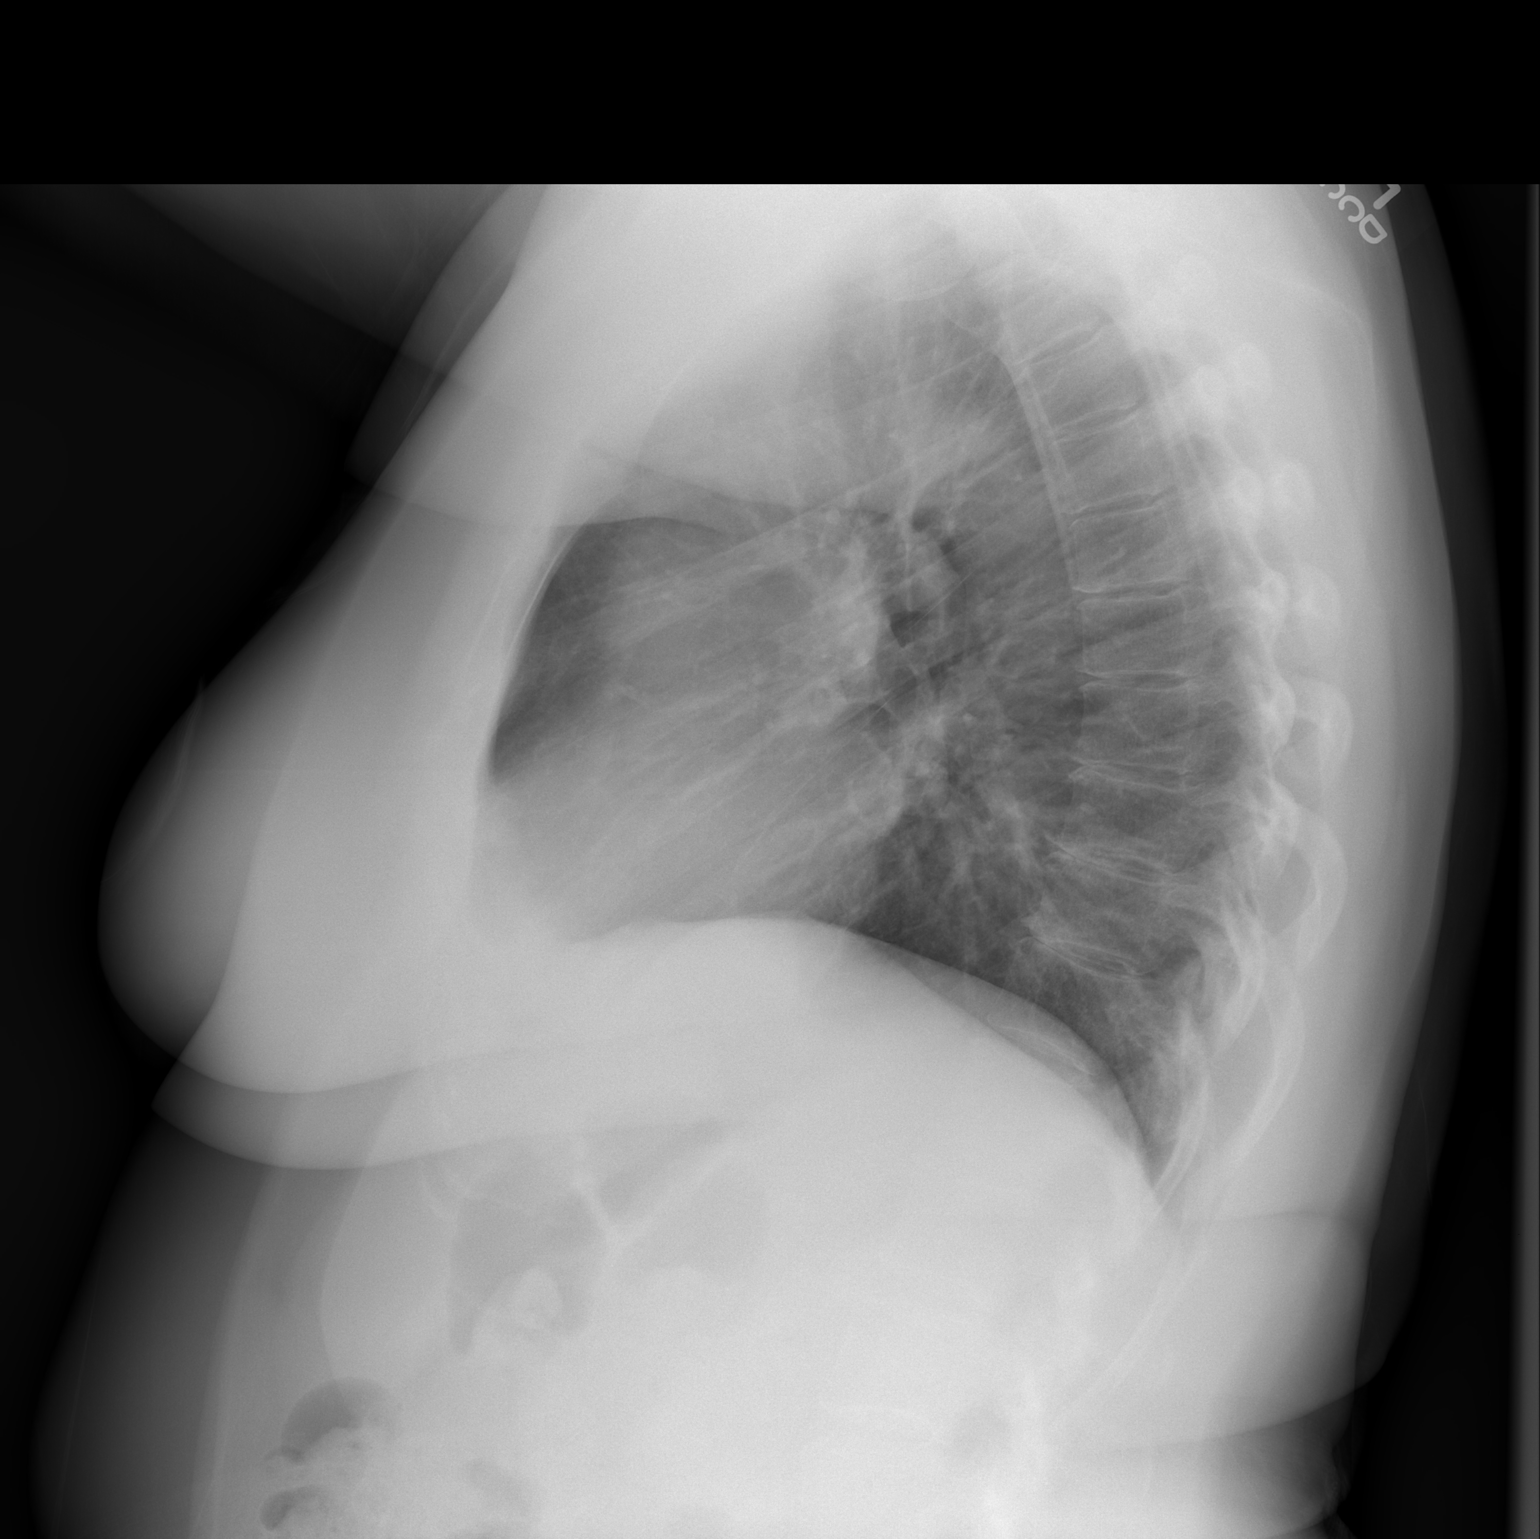

[2 of 2 positions shown; findings below may reference images not displayed]

FINDINGS: Stable cardiac and mediastinal contours. Lungs are clear. No pleural
effusion or pneumothorax. Regional skeleton is unremarkable.
IMPRESSION: No acute cardiopulmonary process.

## 2015-12-14 ENCOUNTER — Other Ambulatory Visit: Payer: Self-pay

## 2015-12-14 DIAGNOSIS — Z780 Asymptomatic menopausal state: Secondary | ICD-10-CM | POA: Diagnosis not present

## 2015-12-14 DIAGNOSIS — Z1231 Encounter for screening mammogram for malignant neoplasm of breast: Secondary | ICD-10-CM | POA: Diagnosis not present

## 2015-12-14 LAB — HM DEXA SCAN: HM DEXA SCAN: NORMAL

## 2015-12-14 LAB — HM MAMMOGRAPHY

## 2015-12-14 NOTE — Telephone Encounter (Signed)
Pt is req refill on lidocaine-hydrocortisone which was written by another Dr which pt no longer sees. Last time it was used was 08-2012 Can you write a new rx?

## 2015-12-14 NOTE — Telephone Encounter (Signed)
Yes --That is fine--Approved.

## 2015-12-14 NOTE — Telephone Encounter (Signed)
Can I get a rx for the lidocaine-hydrocortisone I was not able to get  dose info from previous Rx

## 2015-12-15 ENCOUNTER — Encounter: Payer: Self-pay | Admitting: Family Medicine

## 2015-12-15 MED ORDER — MELOXICAM 15 MG PO TABS: 15.0000 mg | ORAL_TABLET | Freq: Every day | ORAL | 0 refills | Status: DC

## 2015-12-15 NOTE — Telephone Encounter (Signed)
Tell patient that that particular medication is no longer made. Tell her that I am unaware of any similar medication to prescribe. Needs to use over-the-counter medication.

## 2015-12-15 NOTE — Telephone Encounter (Signed)
Rx filled per protocol  

## 2015-12-20 ENCOUNTER — Telehealth: Payer: Self-pay

## 2015-12-20 ENCOUNTER — Encounter: Payer: Self-pay | Admitting: Family Medicine

## 2015-12-20 NOTE — Telephone Encounter (Signed)
Pt states she spoke with the pharmacy and they were able to find her a cream  for her hemorrhoids  and is asking if you would write her a Rx for this   Lidocaine-HC 2-2% cream kit 24 tubes/app wipe pharmapure rx manf Ndc# [phone]

## 2015-12-21 NOTE — Telephone Encounter (Signed)
rx sent to pharmacy

## 2015-12-21 NOTE — Telephone Encounter (Signed)
Approved.  

## 2015-12-22 MED ORDER — LIDOCAINE-HYDROCORTISONE ACE 2-2 % RE KIT: PACK | RECTAL | 2 refills | Status: DC

## 2015-12-23 ENCOUNTER — Telehealth: Payer: Self-pay

## 2015-12-23 NOTE — Telephone Encounter (Signed)
Spoke with pt provided normal bone density results

## 2015-12-27 ENCOUNTER — Encounter: Payer: Self-pay | Admitting: Gastroenterology

## 2015-12-27 ENCOUNTER — Ambulatory Visit (INDEPENDENT_AMBULATORY_CARE_PROVIDER_SITE_OTHER): Payer: BLUE CROSS/BLUE SHIELD | Admitting: Gastroenterology

## 2015-12-27 VITALS — BP 100/64 | HR 64 | Ht 65.75 in | Wt 191.0 lb

## 2015-12-27 DIAGNOSIS — K219 Gastro-esophageal reflux disease without esophagitis: Secondary | ICD-10-CM | POA: Diagnosis not present

## 2015-12-27 DIAGNOSIS — K5900 Constipation, unspecified: Secondary | ICD-10-CM

## 2015-12-27 MED ORDER — OMEPRAZOLE 40 MG PO CPDR: 40.0000 mg | DELAYED_RELEASE_CAPSULE | Freq: Every day | ORAL | 11 refills | Status: DC

## 2015-12-27 NOTE — Progress Notes (Signed)
Review of pertinent gastrointestinal problems: 1. Routine risk for colon cancer, colonoscopy September 2017 Dr. Owens Loffler for cancer screening. The examination was normal. She was recommended to have repeat colonoscopy for colon cancer screening at 10 year interval.    HPI: This is a   very pleasant 60 year old woman  who was referred to me by Orlena Sheldon, PA-C  to evaluate  mild chronic constipation, chronic GERD .    Chief complaint is mild chronic constipation, chronic GERD  I met her 3 months ago the time of a screening colonoscopy. See those results above. At that time she describes some chronic GERD and mild constipation issues and so we arranged for this new patient visit here.  She takes zegeride and reglan.  Has had heartburn for many years. When she lays down she has burning in her chest.  She sleeps a bit more upright with newer bed.  No dysphagia. She has intentionally lost 40 pounds in 2 years.  She had and EGD many years ago at [facility]  She was having dysphagia. She had dilation a couple times. She was told she had Barrett's at one point and then the barrett's had 'gone away' then next EGd.  Last look was probably 4-5.  She has been on PPI for many years.  She takes zegeride at 4pm (bedtime at 6pm) usually wakes around 5:30AM.    She takes reglan '10mg'$  for many many years. She has never really had nausea.  Has been on it for a long time, as long as the antiacid medicines.  Takes twice daily 6am, and 4:30pm.  She has mild constipation; chronically.   Has 3 BMs per week, usually has to push and strain with BMs. Tried linzess, too soft. Miralax also, hard to regulate.     Review of systems: Pertinent positive and negative review of systems were noted in the above HPI section. Complete review of systems was performed and was otherwise normal.   Past Medical History:  Diagnosis Date  . Arthritis   . Barrett's esophagus with esophagitis 07/23/12   EGD: CLO test  results negative. pathology: Barretss: Repeat EGD 3 years  . Depression   . Family history of anesthesia complication    mother trouble waking up  . Frequency of urination   . GERD (gastroesophageal reflux disease)    h/o peptic ulcer  . High cholesterol   . HSV-2 infection    anal  . Nocturia   . Obesity   . Overactive bladder   . Reflux   . Urgency of urination     Past Surgical History:  Procedure Laterality Date  . ARTHROSCOPY KNEE W/ DRILLING     X4 ON RT KNEE  . CARPAL TUNNEL RELEASE Right 2000  . colonscopy  2007  . foot fibroma surgery  all before 1985   x 3  . HEMORRHOID SURGERY  04/2006  . KNEE ARTHROSCOPY     X2 LEFT KNEE  . SINUS EXPLORATION  1995  . TOE AMPUTATION  1959  . TOTAL KNEE ARTHROPLASTY Right 09/22/2012   Procedure: RIGHT TOTAL KNEE ARTHROPLASTY;  Surgeon: [name], MD;  Location: [facility];  Service: Orthopedics;  Laterality: Right;  . TOTAL KNEE ARTHROPLASTY Left 09/28/2013   Procedure: LEFT TOTAL KNEE ARTHROPLASTY, CORTISONE INJECTION RIGHT HIP;  Surgeon: [name], MD;  Location: [facility];  Service: Orthopedics;  Laterality: Left;    Current Outpatient Prescriptions  Medication Sig Dispense Refill  . acyclovir (ZOVIRAX) 400  MG tablet TAKE 1 TABLET BY MOUTH 2 TIMES DAILY 180 tablet 1  . ALPRAZolam (XANAX) 0.25 MG tablet take 1 tablet by mouth at bedtime 30 tablet 2  . DULoxetine (CYMBALTA) 60 MG capsule TAKE 2 CAPSULES BY MOUTH DAILY 180 capsule 1  . ezetimibe (ZETIA) 10 MG tablet take 1 tablet by mouth every morning 90 tablet 0  . fluticasone (FLONASE) 50 MCG/ACT nasal spray PLACE 1 SPRAY IN BOTH NOSTRILS DAILY 48 g 3  . Lidocaine-Hydrocortisone Ace 2-2 % KIT Apply 2 times daily as needed(Only with flare ups) 5 each 2  . Lidocaine-Hydrocortisone Ace 3-2.5 % KIT Place rectally 2 (two) times daily as needed (only with flare ups).    . meloxicam (MOBIC) 15 MG tablet Take 1 tablet (15 mg total) by mouth daily. 90 tablet 0  . metoCLOPramide  (REGLAN) 10 MG tablet TAKE 1 TABLET BY MOUTH 3 TIMES DAILY BEFORE MEALS 90 tablet 0  . Multiple Vitamin (MULTIVITAMIN) tablet Take 1 tablet by mouth daily.    Marland Kitchen omeprazole-sodium bicarbonate (ZEGERID) 40-1100 MG capsule take 1 capsule by mouth twice a day 180 capsule 0  . OVER THE COUNTER MEDICATION Vitamin B12    . OVER THE COUNTER MEDICATION Vitamin d 3    . solifenacin (VESICARE) 10 MG tablet Take by mouth daily.     No current facility-administered medications for this visit.     Allergies as of 12/27/2015 - Review Complete 12/27/2015  Allergen Reaction Noted  . Cephalosporins Diarrhea 05/24/2012  . Codeine Itching 05/24/2012  . Latex Itching 09/14/2013  . Pravastatin  08/28/2012  . Simvastatin  05/26/2012  . Sulfa antibiotics Diarrhea 05/24/2012    Family History  Problem Relation Age of Onset  . Pulmonary fibrosis Father   . Cancer Sister     pancreatic  . Diabetes Brother   . Colon cancer Neg Hx     Social History   Social History  . Marital status: Divorced    Spouse name: N/A  . Number of children: N/A  . Years of education: N/A   Occupational History  . Not on file.   Social History Main Topics  . Smoking status: Never Smoker  . Smokeless tobacco: Never Used  . Alcohol use 3.6 oz/week    6 Cans of beer per week     Comment: 6  . Drug use: No  . Sexual activity: Not on file   Other Topics Concern  . Not on file   Social History Narrative  . No narrative on file     Physical Exam: Ht 5' 5.75" (1.67 m) Comment: measured without shoes  Wt 191 lb (86.6 kg)   BMI 31.06 kg/m  Constitutional: generally well-appearing Psychiatric: alert and oriented x3 Eyes: extraocular movements intact Mouth: oral pharynx moist, no lesions Neck: supple no lymphadenopathy Cardiovascular: heart regular rate and rhythm Lungs: clear to auscultation bilaterally Abdomen: soft, nontender, nondistended, no obvious ascites, no peritoneal signs, normal bowel  sounds Extremities: no lower extremity edema bilaterally Skin: no lesions on visible extremities   Assessment and plan: 60 y.o. female with  Chronic mild GERD, chronic mild constipation  She is not taking proton pump inhibitor correct time and relation to meals and so we will amend that for her. Also she has trouble with her insurance company affording the Zegerid.  She is instead going to take omeprazole 40 mg shortly before breakfast meal on a daily basis. She will also take ranitidine 150 mg pills at bedtime every night  since evening time seems to be when she has her most significant GERD symptoms. I recommended she stop taking the Reglan. I see no use for it for average GERD symptoms. She has mild chronic constipation and she is going to try daily fiber supplements. She will return to see me in 2-3 months and sooner if needed.   Owens Loffler, MD [facility] 12/27/2015, 11:05 AM  Cc: Orlena Sheldon, PA-C

## 2015-12-27 NOTE — Patient Instructions (Addendum)
Stop zegeride. Start omeprazole 40mg  pill, take one pill 20-30 min before BF meal.  Disp 30 with 11 refills. Stop taking reglan. Start ranitidine 150mg  pill, take at bedtime nightly. Please start taking citrucel (orange flavored) powder fiber supplement.  This may cause some bloating at first but that usually goes away. Begin with a small spoonful and work your way up to a large, heaping spoonful daily over a week. Please return to see Dr. Ardis Hughs in 2-3 months. Please call to set up in a few weeks.

## 2016-01-16 HISTORY — PX: HIP FRACTURE SURGERY: SHX118

## 2016-01-22 ENCOUNTER — Other Ambulatory Visit: Payer: Self-pay | Admitting: Physician Assistant

## 2016-01-23 NOTE — Telephone Encounter (Signed)
rx filled per protocol  

## 2016-02-22 DIAGNOSIS — M5136 Other intervertebral disc degeneration, lumbar region: Secondary | ICD-10-CM | POA: Diagnosis not present

## 2016-02-29 ENCOUNTER — Other Ambulatory Visit: Payer: Self-pay | Admitting: Physician Assistant

## 2016-03-01 NOTE — Telephone Encounter (Signed)
Rx filled per protocol  

## 2016-04-16 ENCOUNTER — Other Ambulatory Visit: Payer: Self-pay | Admitting: Physician Assistant

## 2016-04-17 NOTE — Telephone Encounter (Signed)
Rx filled.Patient is due for a 6 month f/u letter mailed to schedule an appointment

## 2016-04-26 ENCOUNTER — Ambulatory Visit (INDEPENDENT_AMBULATORY_CARE_PROVIDER_SITE_OTHER): Payer: BLUE CROSS/BLUE SHIELD | Admitting: Physician Assistant

## 2016-04-26 ENCOUNTER — Encounter: Payer: Self-pay | Admitting: Physician Assistant

## 2016-04-26 VITALS — BP 100/60 | HR 64 | Temp 98.0°F | Resp 14 | Wt 191.4 lb

## 2016-04-26 DIAGNOSIS — N3281 Overactive bladder: Secondary | ICD-10-CM | POA: Diagnosis not present

## 2016-04-26 DIAGNOSIS — E7800 Pure hypercholesterolemia, unspecified: Secondary | ICD-10-CM | POA: Diagnosis not present

## 2016-04-26 DIAGNOSIS — F419 Anxiety disorder, unspecified: Secondary | ICD-10-CM

## 2016-04-26 MED ORDER — ACYCLOVIR 400 MG PO TABS: 400.0000 mg | ORAL_TABLET | Freq: Two times a day (BID) | ORAL | 1 refills | Status: DC

## 2016-04-26 MED ORDER — MELOXICAM 15 MG PO TABS: 15.0000 mg | ORAL_TABLET | Freq: Every day | ORAL | 2 refills | Status: DC

## 2016-04-26 MED ORDER — ALPRAZOLAM 0.25 MG PO TABS: 0.2500 mg | ORAL_TABLET | Freq: Every day | ORAL | 2 refills | Status: DC

## 2016-04-26 MED ORDER — DULOXETINE HCL 60 MG PO CPEP: 120.0000 mg | ORAL_CAPSULE | Freq: Every day | ORAL | 1 refills | Status: DC

## 2016-04-26 MED ORDER — FLUTICASONE PROPIONATE 50 MCG/ACT NA SUSP: NASAL | 3 refills | Status: DC

## 2016-04-26 MED ORDER — OMEPRAZOLE 40 MG PO CPDR: 40.0000 mg | DELAYED_RELEASE_CAPSULE | Freq: Every day | ORAL | 2 refills | Status: DC

## 2016-04-26 MED ORDER — EZETIMIBE 10 MG PO TABS: 10.0000 mg | ORAL_TABLET | Freq: Every morning | ORAL | 2 refills | Status: DC

## 2016-04-26 NOTE — Progress Notes (Signed)
Patient ID: MYKA HITZ MRN: [id number], DOB: Jul 27, 1955, 61 y.o. Date of Encounter: '@DATE'$ @  Chief Complaint:  Chief Complaint  Patient presents with  . Medication Refill    HPI: 61 y.o. year old female  presents for routine follow-up office visit.   She sees the following medical providers on a routine basis/recently: Dr. McDirmiad, Urologist Says that she sees a dermatologist once a year Also sees gynecologist Dr. Ronita Hipps says she has seen GI in [facility] but only for EGD and colonoscopy She has had TKR by Dr. Maureen Ralphs in past--had one done then the other done at a later date  Hyperlipidemia:   simvastatin and pravastatin caused significant myalgias. She is now on Zetia and tolerating this. She's currently taking over-the-counter fish oil.  OAB: 08/2014 OV :  She sees Urology. She is on both Myrbetriq and oxybutynin.           03/2015 OV---She states that her gynecologist recommended her stop the oxybutynin. Says that they also discussed possibly increasing dose of Myrbetrique. She states that she is waking up multiple times                                during the night urinating. Is interested in increasing the dose of Myrbetrique.  Anxiety/depression she is  still on the Cymbalta 60 mg 2 tablet daily. This continues to control her anxiety and depression and her mood is stable on medication.She has no adverse effects to this medication.               She says that she rarely uses Xanax. Says that she only uses 2 or 3 per month.   Weight Management: 08/2014---She reported that she did a weight loss program for 12 weeks. Says that included some supplements as well as a very strict diet for 12 weeks. Says that she completed that 12 week treatment one month ago. Says that she is still on some maintenance supplement and trying to eat healthy and keep maintain her weight loss. Weight at office visit 02/15/2014 was 222. Weight today 08/23/2014 is 196. Lost 26 pounds. 03/2015----she reports  that she is now doing weight watchers. Says that she started gaining the weight back and is in Weight Watchers now to try to help maintain this. 10/12/2015--she reports that she is still doing weight watchers. Weight at office visit with me 03/2015 was 207. Weight today 187. Down 20 pounds !!!  --------------- she states that her niece who was in her 32s is going with her to weight watchers. 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 Says that she goes to Weight Watchers meeting every Tuesday night. Is at most --------------one meeting since her last visit with me. Says that she loves it. Says that she continued plans to continue going every Tuesday night forever. Says that she has made great friends there and they really --------------enjoying each other and exchanging recipes etc. Says that he keeps her very motivated. Says that she feels the best she has felt in 20 years.  She does look like she feels good today. Seems very energetic, in good spirits.  Says that her Dad has Pulmonary Fibrosis and is in Hospice. Needs refill on her Xanax to have available to use if needed--- "if my mom calls me, crying or if I can't fall asleep". No other concerns to address today.  Past Medical History:  Diagnosis Date  . Arthritis   .  Barrett's esophagus with esophagitis 07/23/12   EGD: CLO test results negative. pathology: Barretss: Repeat EGD 3 years  . Depression   . Family history of anesthesia complication    mother trouble waking up  . Frequency of urination   . GERD (gastroesophageal reflux disease)    h/o peptic ulcer  . High cholesterol   . HSV-2 infection    anal  . Nocturia   . Obesity   . Overactive bladder   . Reflux   . Urgency of urination      Home Meds:  Outpatient Medications Prior to Visit  Medication Sig Dispense Refill  . metoCLOPramide (REGLAN) 10 MG tablet TAKE 1 TABLET BY MOUTH 3 TIMES DAILY BEFORE MEALS 90 tablet 0  . Multiple Vitamin (MULTIVITAMIN) tablet Take 1 tablet by mouth  daily.    Marland Kitchen OVER THE COUNTER MEDICATION Vitamin B12    . OVER THE COUNTER MEDICATION Vitamin d 3    . solifenacin (VESICARE) 10 MG tablet Take by mouth daily.    Marland Kitchen acyclovir (ZOVIRAX) 400 MG tablet TAKE 1 TABLET BY MOUTH 2 TIMES DAILY 180 tablet 1  . ALPRAZolam (XANAX) 0.25 MG tablet take 1 tablet by mouth at bedtime 30 tablet 2  . DULoxetine (CYMBALTA) 60 MG capsule TAKE 2 CAPSULES BY MOUTH DAILY 180 capsule 1  . ezetimibe (ZETIA) 10 MG tablet take 1 tablet by mouth every morning 30 tablet 0  . fluticasone (FLONASE) 50 MCG/ACT nasal spray PLACE 1 SPRAY IN BOTH NOSTRILS DAILY 48 g 3  . meloxicam (MOBIC) 15 MG tablet TAKE 1 TABLET BY MOUTH DAILY 90 tablet 0  . omeprazole (PRILOSEC) 40 MG capsule Take 1 capsule (40 mg total) by mouth daily. 30 capsule 11  . Lidocaine-Hydrocortisone Ace 2-2 % KIT Apply 2 times daily as needed(Only with flare ups) 5 each 2  . Lidocaine-Hydrocortisone Ace 3-2.5 % KIT Place rectally 2 (two) times daily as needed (only with flare ups).    Marland Kitchen omeprazole-sodium bicarbonate (ZEGERID) 40-1100 MG capsule take 1 capsule by mouth twice a day 180 capsule 0   No facility-administered medications prior to visit.     Allergies:  Allergies  Allergen Reactions  . Cephalosporins Diarrhea  . Codeine Itching  . Latex Itching  . Pravastatin     Myalgias  . Simvastatin     myalgias  . Sulfa Antibiotics Diarrhea    Social History   Social History  . Marital status: Divorced    Spouse name: N/A  . Number of children: N/A  . Years of education: N/A   Occupational History  . Not on file.   Social History Main Topics  . Smoking status: Never Smoker  . Smokeless tobacco: Never Used  . Alcohol use 3.6 oz/week    6 Cans of beer per week     Comment: 6  . Drug use: No  . Sexual activity: Not on file   Other Topics Concern  . Not on file   Social History Narrative  . No narrative on file    Family History  Problem Relation Age of Onset  . Pulmonary fibrosis  Father   . Cancer Sister     pancreatic  . Diabetes Brother   . Colon cancer Neg Hx      Review of Systems:  See HPI for pertinent ROS. All other ROS negative.    Physical Exam: Blood pressure 100/60, pulse 64, temperature 98 F (36.7 C), temperature source Oral, resp. rate 14, weight 191 lb  6.4 oz (86.8 kg), SpO2 98 %., Body mass index is 31.13 kg/m. General: Obese white female. Appears in no acute distress. Neck: Supple. No thyromegaly. No lymphadenopathy.No carotid bruits. Lungs: Clear bilaterally to auscultation without wheezes, rales, or rhonchi. Breathing is unlabored. Heart: RRR with S1 S2. No murmurs, rubs, or gallops. Abdomen: Soft, non-tender, non-distended with normoactive bowel sounds. No hepatomegaly. No rebound/guarding. No obvious abdominal masses. Musculoskeletal:  Strength and tone normal for age. Extremities/Skin: Warm and dry. No edema. Neuro: Alert and oriented X 3. Moves all extremities spontaneously. Gait is normal. CNII-XII grossly in tact. Psych:  Responds to questions appropriately with a normal affect.     ASSESSMENT AND PLAN:  61 y.o. year old female with      High cholesterol She is intolerant to statins. Continue Zetia and fish oil. For labs--she always requests to get lab order and get lab drawn at location on [address] -- this is more convenient for her to do fasting. She states that she has to be at work prior to the time of our office opens. Last labs 12/2015 excellent---cont Zetia, Weight Watchers/diet changes   Overactive bladder Symptoms are stable, controlled. Cont Myrbetriq  Obesity Have discussed diet and exercise multiple times. At [place] 08/23/2014 she reported she had done intense 12 week weight loss program At OV 03/2015---she is now doing Weight Watchers to help maintain weight loss. At [place] 09/2015--she is continuing to do Weight Watchers. Has lost 20 pounds since her visit with me 6 months ago-- 03/2015 At [place] 04/2016--Says that she is on  weight watchers and is maintaining. Not really losing at this point but is at least trying to maintain.  Anxiety Continue current dose of Cymbalta. Cont Xanax PRN  Depression Continue current dose of Cymbalta  GERD (gastroesophageal reflux disease) Stable/controlled with current treatment.  Osteoarthritis of multiple joints She has had knee replacements bilaterally. She uses Mobic with food PRN  HSV-2 infection Stable, controlled---cont acyclovir  OA (osteoarthritis) of knee She has had knee replacements bilaterally. She uses Mobic with food PRN  Barrett's esophagus with esophagitis Per GI     Colonoscopy:  6 /15/2007. Negative. Repeat 10 years.                        --- Visit 09/2015-- she reports that she did have follow-up colonoscopy and was told that she can wait to repeat 10 years. Immunizations:Tdap  Given here 06/04/2011 `  Zostavax----She has received this   F/U OV 6 months, sooner if needed.   Marin Olp [place], [place], [facility] 04/26/2016 3:06 PM

## 2016-05-30 DIAGNOSIS — M1611 Unilateral primary osteoarthritis, right hip: Secondary | ICD-10-CM | POA: Diagnosis not present

## 2016-06-06 ENCOUNTER — Ambulatory Visit: Payer: BLUE CROSS/BLUE SHIELD | Admitting: Physician Assistant

## 2016-06-13 DIAGNOSIS — M791 Myalgia: Secondary | ICD-10-CM | POA: Diagnosis not present

## 2016-06-13 DIAGNOSIS — M9902 Segmental and somatic dysfunction of thoracic region: Secondary | ICD-10-CM | POA: Diagnosis not present

## 2016-06-13 DIAGNOSIS — M9901 Segmental and somatic dysfunction of cervical region: Secondary | ICD-10-CM | POA: Diagnosis not present

## 2016-06-13 DIAGNOSIS — M5032 Other cervical disc degeneration, mid-cervical region, unspecified level: Secondary | ICD-10-CM | POA: Diagnosis not present

## 2016-06-15 DIAGNOSIS — M791 Myalgia: Secondary | ICD-10-CM | POA: Diagnosis not present

## 2016-06-15 DIAGNOSIS — M9902 Segmental and somatic dysfunction of thoracic region: Secondary | ICD-10-CM | POA: Diagnosis not present

## 2016-06-15 DIAGNOSIS — M5032 Other cervical disc degeneration, mid-cervical region, unspecified level: Secondary | ICD-10-CM | POA: Diagnosis not present

## 2016-06-15 DIAGNOSIS — M9901 Segmental and somatic dysfunction of cervical region: Secondary | ICD-10-CM | POA: Diagnosis not present

## 2016-06-18 DIAGNOSIS — M5136 Other intervertebral disc degeneration, lumbar region: Secondary | ICD-10-CM | POA: Diagnosis not present

## 2016-06-18 DIAGNOSIS — M1611 Unilateral primary osteoarthritis, right hip: Secondary | ICD-10-CM | POA: Diagnosis not present

## 2016-07-31 DIAGNOSIS — D1720 Benign lipomatous neoplasm of skin and subcutaneous tissue of unspecified limb: Secondary | ICD-10-CM | POA: Diagnosis not present

## 2016-08-21 ENCOUNTER — Telehealth: Payer: Self-pay

## 2016-08-21 NOTE — Telephone Encounter (Signed)
Patient called and states she no longer goes to urologist because she doesn't feel the need to and they require an office visit before anymore refills. Patient is asking if you would start filling her vesicare?

## 2016-08-22 MED ORDER — SOLIFENACIN SUCCINATE 10 MG PO TABS: 10.0000 mg | ORAL_TABLET | Freq: Every day | ORAL | 4 refills | Status: DC

## 2016-08-22 NOTE — Telephone Encounter (Signed)
Yes. Can send in refills for one year supply.

## 2016-08-22 NOTE — Telephone Encounter (Signed)
rx filled

## 2016-08-30 DIAGNOSIS — M7062 Trochanteric bursitis, left hip: Secondary | ICD-10-CM | POA: Diagnosis not present

## 2016-09-02 ENCOUNTER — Other Ambulatory Visit: Payer: Self-pay | Admitting: Physician Assistant

## 2016-09-03 NOTE — Telephone Encounter (Signed)
Rx filled per protocol.Patient due for office visit letter mailed

## 2016-10-08 DIAGNOSIS — L570 Actinic keratosis: Secondary | ICD-10-CM | POA: Diagnosis not present

## 2016-10-08 DIAGNOSIS — D225 Melanocytic nevi of trunk: Secondary | ICD-10-CM | POA: Diagnosis not present

## 2016-10-08 DIAGNOSIS — L814 Other melanin hyperpigmentation: Secondary | ICD-10-CM | POA: Diagnosis not present

## 2016-10-08 DIAGNOSIS — L821 Other seborrheic keratosis: Secondary | ICD-10-CM | POA: Diagnosis not present

## 2016-10-08 DIAGNOSIS — Z85828 Personal history of other malignant neoplasm of skin: Secondary | ICD-10-CM | POA: Diagnosis not present

## 2016-10-10 ENCOUNTER — Encounter: Payer: Self-pay | Admitting: Physician Assistant

## 2016-10-10 ENCOUNTER — Ambulatory Visit (INDEPENDENT_AMBULATORY_CARE_PROVIDER_SITE_OTHER): Payer: BLUE CROSS/BLUE SHIELD | Admitting: Physician Assistant

## 2016-10-10 VITALS — BP 102/70 | HR 70 | Temp 98.3°F | Resp 14 | Ht 66.5 in | Wt 193.8 lb

## 2016-10-10 DIAGNOSIS — Z23 Encounter for immunization: Secondary | ICD-10-CM

## 2016-10-10 DIAGNOSIS — M170 Bilateral primary osteoarthritis of knee: Secondary | ICD-10-CM | POA: Diagnosis not present

## 2016-10-10 DIAGNOSIS — K219 Gastro-esophageal reflux disease without esophagitis: Secondary | ICD-10-CM

## 2016-10-10 DIAGNOSIS — N3281 Overactive bladder: Secondary | ICD-10-CM

## 2016-10-10 DIAGNOSIS — F330 Major depressive disorder, recurrent, mild: Secondary | ICD-10-CM

## 2016-10-10 DIAGNOSIS — E7800 Pure hypercholesterolemia, unspecified: Secondary | ICD-10-CM | POA: Diagnosis not present

## 2016-10-10 DIAGNOSIS — M150 Primary generalized (osteo)arthritis: Secondary | ICD-10-CM

## 2016-10-10 DIAGNOSIS — M159 Polyosteoarthritis, unspecified: Secondary | ICD-10-CM

## 2016-10-10 DIAGNOSIS — M8949 Other hypertrophic osteoarthropathy, multiple sites: Secondary | ICD-10-CM

## 2016-10-10 DIAGNOSIS — K5909 Other constipation: Secondary | ICD-10-CM | POA: Diagnosis not present

## 2016-10-10 DIAGNOSIS — F419 Anxiety disorder, unspecified: Secondary | ICD-10-CM | POA: Diagnosis not present

## 2016-10-10 DIAGNOSIS — B009 Herpesviral infection, unspecified: Secondary | ICD-10-CM | POA: Diagnosis not present

## 2016-10-10 MED ORDER — ALPRAZOLAM 0.25 MG PO TABS: 0.2500 mg | ORAL_TABLET | Freq: Two times a day (BID) | ORAL | 2 refills | Status: DC | PRN

## 2016-10-10 MED ORDER — METOCLOPRAMIDE HCL 10 MG PO TABS: ORAL_TABLET | ORAL | 0 refills | Status: DC

## 2016-10-10 MED ORDER — MELOXICAM 15 MG PO TABS: 15.0000 mg | ORAL_TABLET | Freq: Every day | ORAL | 2 refills | Status: DC

## 2016-10-10 MED ORDER — OMEPRAZOLE 40 MG PO CPDR: 40.0000 mg | DELAYED_RELEASE_CAPSULE | Freq: Every day | ORAL | 2 refills | Status: DC

## 2016-10-10 MED ORDER — EZETIMIBE 10 MG PO TABS: 10.0000 mg | ORAL_TABLET | Freq: Every morning | ORAL | 2 refills | Status: DC

## 2016-10-10 MED ORDER — CIPROFLOXACIN HCL 250 MG PO TABS: 250.0000 mg | ORAL_TABLET | Freq: Two times a day (BID) | ORAL | 3 refills | Status: DC

## 2016-10-10 MED ORDER — DULOXETINE HCL 60 MG PO CPEP: 120.0000 mg | ORAL_CAPSULE | Freq: Every day | ORAL | 1 refills | Status: DC

## 2016-10-10 NOTE — Progress Notes (Addendum)
Patient ID: Kristi Avila MRN: [id number], DOB: Jan 13, 1956, 61 y.o. Date of Encounter: @DATE @  Chief Complaint:  Chief Complaint  Patient presents with  . medication refills    HPI: 61 y.o. year old female  presents for routine follow-up office visit.   She sees the following medical providers on a routine basis/recently: Dr. McDirmiad, Urologist Says that she sees a dermatologist once a year Also sees gynecologist Dr. Ronita Hipps says she has seen GI in [facility] but only for EGD and colonoscopy She has had TKR by Dr. Maureen Ralphs in past--had one done then the other done at a later date  Hyperlipidemia:   simvastatin and pravastatin caused significant myalgias. She is now on Zetia and tolerating this. She's currently taking over-the-counter fish oil. 10/10/2016: She is continuing to take the Zetia and the fish oil.   OAB: 08/2014 OV :  She sees Urology. She is on both Myrbetriq and oxybutynin.           03/2015 OV---She states that her gynecologist recommended her stop the oxybutynin. Says that they also discussed possibly increasing dose of Myrbetrique. She states that she is waking up multiple times                                during the night urinating. Is interested in increasing the dose of Myrbetrique.             10/10/2016--- she continues to see urology. She continues Myrbetriq  Anxiety/depression she is  still on the Cymbalta 60 mg 2 tablet daily. This continues to control her anxiety and depression and her mood is stable on medication.She has no adverse effects to this medication.               She says that she rarely uses Xanax. Says that she only uses 2 or 3 per month.  10/10/16: She reports that her father did pass away last week after being with hospice. Says that it has been difficult and plus she has been having to help her mother deal with things. Says that her mom  uses a walker and has a lot of medical problems herself. Says that her brother does help. Discussed giving her  some more Xanax to have available if she needs them for short short-term. In the past up  until now she has been taking just one Xanax at night for sleep but says that she has needed one during the day sometimes recently dealing with things. 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  Weight Management: 08/2014---She reported that she did a weight loss program for 12 weeks. Says that included some supplements as well as a very strict diet for 12 weeks. Says that she completed that 12 week treatment one month ago. Says that she is still on some maintenance supplement and trying to eat healthy and keep maintain her weight loss. Weight at office visit 02/15/2014 was 222. Weight today 08/23/2014 is 196. Lost 26 pounds. 03/2015----she reports that she is now doing weight watchers. Says that she started gaining the weight back and is in Weight Watchers now to try to help maintain this. 10/12/2015--she reports that she is still doing weight watchers. Weight at office visit with me 03/2015 was 207. Weight today 187. Down 20 pounds !!!  --------------- she states  that her niece who was in her 33s is going with her to weight watchers. 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 Says that she goes to Weight Watchers meeting every Tuesday night. Is at most --------------one meeting since her last visit with me. Says that she loves it. Says that she continued plans to continue going every Tuesday night forever. Says that she has made great friends there and they really --------------enjoying each other and exchanging recipes etc. Says that he keeps her very motivated. Says that she feels the best she has felt in 20 years. 10/10/2016----reviewed that her weight has remained stable at today's visit compared to last visit. 04/26/16 weight was 191. Today 193.8. Especially given the recent death of her father. However she states that she        continued to eat her salads and continued to eat healthy even when going through that time.  10/10/2016--She does look like she feels good today. Seems very energetic, in good spirits.  10/10/2016--says that she recently saw Ronald Lobo when she went to [place]. Learned that my husband works there and that [place] Landry sees me at the Christmas parties there.  10/10/2016--she asked if I can give her refill on Cipro to use as needed when she has sex. States that she uses this when has sex to prevent UTI.  No other concerns today.    Past Medical History:  Diagnosis Date  . Arthritis   . Barrett's esophagus with esophagitis 07/23/12   EGD: CLO test results negative. pathology: Barretss: Repeat EGD 3 years  . Depression   . Family history of anesthesia complication    mother trouble waking up  . Frequency of urination   . GERD (gastroesophageal reflux disease)    h/o peptic ulcer  . High cholesterol   . HSV-2 infection    anal  . Nocturia   . Obesity   . Overactive bladder   . Reflux   . Urgency of urination      Home Meds:  Outpatient Medications Prior to Visit  Medication Sig Dispense Refill  . acyclovir (ZOVIRAX) 400 MG tablet Take 1 tablet (400 mg total) by mouth 2 (two) times daily. 180 tablet 1  . ALPRAZolam (XANAX) 0.25 MG tablet Take 1 tablet (0.25 mg total) by mouth at bedtime. 30 tablet 2  . DULoxetine (CYMBALTA) 60 MG capsule Take 2 capsules (120 mg total) by mouth daily. 180 capsule 1  . ezetimibe (ZETIA) 10 MG tablet Take 1 tablet (10 mg total) by mouth every morning. 90 tablet 2  . fluticasone (FLONASE) 50 MCG/ACT nasal spray PLACE 1 SPRAY IN BOTH NOSTRILS DAILY 48 g 3  . meloxicam (MOBIC) 15 MG tablet Take 1 tablet (15 mg total) by mouth daily. 90 tablet 2  . metoCLOPramide (REGLAN) 10 MG tablet TAKE 1 TABLET BY MOUTH THREE TIMES DAILY BEFORE MEALS 90 tablet 0  . Multiple Vitamin (MULTIVITAMIN) tablet Take 1 tablet by mouth daily.    Marland Kitchen omeprazole (PRILOSEC)  40 MG capsule Take 1 capsule (40 mg total) by mouth daily. 90 capsule 2  . OVER THE COUNTER MEDICATION Vitamin B12    . OVER THE COUNTER MEDICATION Vitamin d 3    . solifenacin (VESICARE) 10 MG tablet Take 1 tablet (10 mg total) by mouth daily. 90 tablet 4   No facility-administered medications prior to visit.     Allergies:  Allergies  Allergen Reactions  . Cephalosporins Diarrhea  . Codeine Itching  . Latex Itching  .  Pravastatin     Myalgias  . Simvastatin     myalgias  . Sulfa Antibiotics Diarrhea    Social History   Social History  . Marital status: Divorced    Spouse name: N/A  . Number of children: N/A  . Years of education: N/A   Occupational History  . Not on file.   Social History Main Topics  . Smoking status: Never Smoker  . Smokeless tobacco: Never Used  . Alcohol use 3.6 oz/week    6 Cans of beer per week     Comment: 6  . Drug use: No  . Sexual activity: Not on file   Other Topics Concern  . Not on file   Social History Narrative  . No narrative on file    Family History  Problem Relation Age of Onset  . Pulmonary fibrosis Father   . Cancer Sister        pancreatic  . Diabetes Brother   . Colon cancer Neg Hx      Review of Systems:  See HPI for pertinent ROS. All other ROS negative.    Physical Exam: Blood pressure 102/70, pulse 70, temperature 98.3 F (36.8 C), temperature source Oral, resp. rate 14, height 5' 6.5" (1.689 m), weight 87.9 kg (193 lb 12.8 oz), SpO2 98 %., There is no height or weight on file to calculate BMI. General: Obese white female. Appears in no acute distress. Neck: Supple. No thyromegaly. No lymphadenopathy.No carotid bruits. Lungs: Clear bilaterally to auscultation without wheezes, rales, or rhonchi. Breathing is unlabored. Heart: RRR with S1 S2. No murmurs, rubs, or gallops. Abdomen: Soft, non-tender, non-distended with normoactive bowel sounds. No hepatomegaly. No rebound/guarding. No obvious abdominal  masses. Musculoskeletal:  Strength and tone normal for age. Extremities/Skin: Warm and dry. No edema. Neuro: Alert and oriented X 3. Moves all extremities spontaneously. Gait is normal. CNII-XII grossly in tact. Psych:  Responds to questions appropriately with a normal affect.     ASSESSMENT AND PLAN:  61 y.o. year old female with      High cholesterol She is intolerant to statins. Continue Zetia and fish oil. For labs--she always requests to get lab order and get lab drawn at location on [address] -- this is more convenient for her to do fasting. She states that she has to be at work prior to the time of our office opens. Last labs 12/2015 excellent---cont Zetia, Weight Watchers/diet changes   Overactive bladder Symptoms are stable, controlled. Cont Myrbetriq  Obesity Have discussed diet and exercise multiple times. At [place] 08/23/2014 she reported she had done intense 12 week weight loss program At [facility] 03/2015---she is now doing Weight Watchers to help maintain weight loss. At [place] 09/2015--she is continuing to do Weight Watchers. Has lost 20 pounds since her visit with me 6 months ago-- 03/2015 At [place] 04/2016--Says that she is on weight watchers and is maintaining. Not really losing at this point but is at least trying to maintain. At [place] 09/2016-- she is on weight watchers and is maintaining. Not really losing at this point but is at least trying to maintain.   Anxiety Continue current dose of Cymbalta. Cont Xanax PRN At [place] 09/2016--increase the Xanax to where she can use 1 twice daily as needed increase quantity #60--- prior to this it had been just use 1 at bedtime for #30  Depression Stable/controlled Continue current dose of Cymbalta  GERD (gastroesophageal reflux disease) Stable/controlled with current treatment.  Osteoarthritis of multiple joints She has  had knee replacements bilaterally. She uses Mobic with food PRN  HSV-2 infection Stable, controlled---cont acyclovir  OA  (osteoarthritis) of knee She has had knee replacements bilaterally. She uses Mobic with food PRN  Barrett's esophagus with esophagitis Per GI     Colonoscopy:  6 /15/2007. Negative. Repeat 10 years.                        --- Visit 09/2015-- she reports that she did have follow-up colonoscopy and was told that she can wait to repeat 10 years. Immunizations:Tdap  Given here 06/04/2011 `  Zostavax----She has received this   F/U OV 6 months, sooner if needed.   Marin Olp [place], [place], [facility] 10/10/2016 2:06 PM

## 2016-10-11 ENCOUNTER — Other Ambulatory Visit: Payer: Self-pay

## 2016-10-23 LAB — HM PAP SMEAR

## 2016-10-26 ENCOUNTER — Other Ambulatory Visit: Payer: Self-pay | Admitting: Physician Assistant

## 2016-11-23 DIAGNOSIS — Z6831 Body mass index (BMI) 31.0-31.9, adult: Secondary | ICD-10-CM | POA: Diagnosis not present

## 2016-11-23 DIAGNOSIS — Z01419 Encounter for gynecological examination (general) (routine) without abnormal findings: Secondary | ICD-10-CM | POA: Diagnosis not present

## 2016-12-12 ENCOUNTER — Other Ambulatory Visit: Payer: Self-pay | Admitting: Physician Assistant

## 2016-12-13 NOTE — Telephone Encounter (Signed)
Medication refilled per protocol. 

## 2016-12-19 DIAGNOSIS — D485 Neoplasm of uncertain behavior of skin: Secondary | ICD-10-CM | POA: Diagnosis not present

## 2016-12-19 DIAGNOSIS — B078 Other viral warts: Secondary | ICD-10-CM | POA: Diagnosis not present

## 2016-12-28 DIAGNOSIS — Z1231 Encounter for screening mammogram for malignant neoplasm of breast: Secondary | ICD-10-CM | POA: Diagnosis not present

## 2016-12-28 LAB — HM MAMMOGRAPHY

## 2017-01-02 ENCOUNTER — Encounter: Payer: Self-pay | Admitting: Family Medicine

## 2017-02-04 DIAGNOSIS — R87612 Low grade squamous intraepithelial lesion on cytologic smear of cervix (LGSIL): Secondary | ICD-10-CM | POA: Diagnosis not present

## 2017-02-05 DIAGNOSIS — M1611 Unilateral primary osteoarthritis, right hip: Secondary | ICD-10-CM | POA: Diagnosis not present

## 2017-02-22 DIAGNOSIS — M7062 Trochanteric bursitis, left hip: Secondary | ICD-10-CM | POA: Diagnosis not present

## 2017-03-17 ENCOUNTER — Other Ambulatory Visit: Payer: Self-pay | Admitting: Physician Assistant

## 2017-03-18 NOTE — Telephone Encounter (Signed)
Refill appropriate 

## 2017-03-26 ENCOUNTER — Other Ambulatory Visit: Payer: Self-pay | Admitting: Physician Assistant

## 2017-03-26 DIAGNOSIS — F419 Anxiety disorder, unspecified: Secondary | ICD-10-CM

## 2017-03-26 NOTE — Telephone Encounter (Signed)
Ok to refill??  Last office visit/ refill 10/10/2016, #2 refills.

## 2017-04-12 DIAGNOSIS — M7061 Trochanteric bursitis, right hip: Secondary | ICD-10-CM | POA: Diagnosis not present

## 2017-04-12 DIAGNOSIS — M1611 Unilateral primary osteoarthritis, right hip: Secondary | ICD-10-CM | POA: Diagnosis not present

## 2017-04-19 ENCOUNTER — Other Ambulatory Visit: Payer: Self-pay

## 2017-04-19 ENCOUNTER — Ambulatory Visit: Payer: BLUE CROSS/BLUE SHIELD | Admitting: Family Medicine

## 2017-04-19 ENCOUNTER — Encounter: Payer: Self-pay | Admitting: Family Medicine

## 2017-04-19 VITALS — BP 110/74 | HR 62 | Temp 98.1°F | Ht 66.5 in | Wt 196.4 lb

## 2017-04-19 DIAGNOSIS — L03011 Cellulitis of right finger: Secondary | ICD-10-CM | POA: Diagnosis not present

## 2017-04-19 MED ORDER — CEPHALEXIN 500 MG PO CAPS: 500.0000 mg | ORAL_CAPSULE | Freq: Two times a day (BID) | ORAL | 0 refills | Status: AC

## 2017-04-19 NOTE — Patient Instructions (Addendum)
Take antibiotics as prescribed.  They can cause diarrhea, so take with yogurts, always take with food, and ask your pharmacist for a probiotic such as Culturelle.  Soak your finger at least twice a day in warm soapy water with about half hydrogen peroxide.  (look for something like Hibiclens Antimicrobial Skin Liquid Soap at [employer])   Keep Clean and dry.  You may cover with a dressing to prevent getting dirty and can apply small amounts of any over-the-counter antibiotic ointment to help keep the skin soft. If in a clean area, like going to bed, can let air dry.  Do the warm soaks for about 3 days, and it should be improving significantly 3 to 5 days from now.  Signs of worsening infection or pockets of abscesses are enlarged swollen and firm area that is very tender and beginning to extend down her finger towards her hand.  It would need to be reevaluated if worsening.  See below for further information, instructions and precautions   Paronychia Paronychia is an infection of the skin that surrounds a nail. It usually affects the skin around a fingernail, but it may also occur near a toenail. It often causes pain and swelling around the nail. This condition may come on suddenly or develop over a longer period. In some cases, a collection of pus (abscess) can form near or under the nail. Usually, paronychia is not serious and it clears up with treatment. What are the causes? This condition may be caused by bacteria or fungi. It is commonly caused by either Streptococcus or Staphylococcus bacteria. The bacteria or fungi often cause the infection by getting into the affected area through an opening in the skin, such as a cut or a hangnail. What increases the risk? This condition is more likely to develop in:  People who get their hands wet often, such as those who work as Designer, industrial/product, bartenders, or nurses.  People who bite their fingernails or suck their thumbs.  People who trim their nails too  short.  People who have hangnails or injured fingertips.  People who get manicures.  People who have diabetes.  What are the signs or symptoms? Symptoms of this condition include:  Redness and swelling of the skin near the nail.  Tenderness around the nail when you touch the area.  Pus-filled bumps under the cuticle. The cuticle is the skin at the base or sides of the nail.  Fluid or pus under the nail.  Throbbing pain in the area.  How is this diagnosed? This condition is usually diagnosed with a physical exam. In some cases, a sample of pus may be taken from an abscess to be tested in a lab. This can help to determine what type of bacteria or fungi is causing the condition. How is this treated? Treatment for this condition depends on the cause and severity of the condition. If the condition is mild, it may clear up on its own in a few days. Your health care provider may recommend soaking the affected area in warm water a few times a day. When treatment is needed, the options may include:  Antibiotic medicine, if the condition is caused by a bacterial infection.  Antifungal medicine, if the condition is caused by a fungal infection.  Incision and drainage, if an abscess is present. In this procedure, the health care provider will cut open the abscess so the pus can drain out.  Follow these instructions at home:  Soak the affected area in warm  water if directed to do so by your health care provider. You may be told to do this for 20 minutes, 2-3 times a day. Keep the area dry in between soakings.  Take medicines only as directed by your health care provider.  If you were prescribed an antibiotic medicine, finish all of it even if you start to feel better.  Keep the affected area clean.  Do not try to drain a fluid-filled bump yourself.  If you will be washing dishes or performing other tasks that require your hands to get wet, wear rubber gloves. You should also wear  gloves if your hands might come in contact with irritating substances, such as cleaners or chemicals.  Follow your health care provider's instructions about: ? Wound care. ? Bandage (dressing) changes and removal. Contact a health care provider if:  Your symptoms get worse or do not improve with treatment.  You have a fever or chills.  You have redness spreading from the affected area.  You have continued or increased fluid, blood, or pus coming from the affected area.  Your finger or knuckle becomes swollen or is difficult to move. This information is not intended to replace advice given to you by your health care provider. Make sure you discuss any questions you have with your health care provider. Document Released: 06/27/2000 Document Revised: 06/09/2015 Document Reviewed: 12/09/2013 Elsevier Interactive Patient Education  [employer].

## 2017-04-19 NOTE — Progress Notes (Signed)
Patient ID: LAPORSCHE HOEGER, female    DOB: 1955-05-16, 62 y.o.   MRN: [id number]  PCP: Orlena Sheldon, PA-C  Chief Complaint  Patient presents with  . ingrown fingernail    Onset 3 days.     Subjective:   Kristi Avila is a 62 y.o. female, presents the office today with 3 days of redness, swelling and pain to her right distal ring finger.  She noted that began to become painful after she got her nails done last Saturday, 6 days ago.  It is throbbing very tender, with moderate amount of pain.  The redness has remained right around the ulnar side of her fingernail and has not extended down her finger towards her hand but there is some swelling and firmness.  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  She states that she was able to express a small amount of yellow to white pus.  The pain and swelling did not improve very much afterwards.  She has normal sensation and movement of her finger.  She has no other constitutional symptoms.   Patient Active Problem List   Diagnosis Date Noted  . Chronic constipation 10/12/2015  . Arthritis   . Barrett's esophagus with esophagitis   . OA (osteoarthritis) of knee 09/22/2012  . Osteoarthritis of multiple joints 09/12/2012  . HSV-2 infection   . High cholesterol   . Overactive bladder   . Obesity   . Anxiety   . Depression   . GERD (gastroesophageal reflux disease)      Prior to Admission medications   Medication Sig Start Date End Date Taking? Authorizing Provider  acyclovir (ZOVIRAX) 400 MG tablet Take 1 tablet (400 mg total) by mouth 2 (two) times daily. 04/26/16  Yes Orlena Sheldon, PA-C  ALPRAZolam Duanne Moron) 0.25 MG tablet TAKE 1 TABLET BY MOUTH TWICE DAILY AS NEEDED FOR ANXIETY 03/27/17  Yes Orlena Sheldon, PA-C  DULoxetine (CYMBALTA) 60 MG capsule Take 2 capsules (120 mg total) by mouth daily. 10/10/16  Yes Orlena Sheldon, PA-C  ezetimibe (ZETIA) 10 MG tablet Take 1 tablet (10 mg total) by  mouth every morning. 10/10/16  Yes Dixon, Mary B, PA-C  fluticasone [facility]) 50 MCG/ACT nasal spray PLACE 1 SPRAY IN BOTH NOSTRILS DAILY 04/26/16  Yes Orlena Sheldon, PA-C  meloxicam (MOBIC) 15 MG tablet Take 1 tablet (15 mg total) by mouth daily. 10/10/16  Yes Dena Billet B, PA-C  metoCLOPramide (REGLAN) 10 MG tablet TAKE 1 TABLET BY MOUTH THREE TIMES DAILY BEFORE MEALS 03/18/17  Yes Orlena Sheldon, PA-C  Multiple Vitamin (MULTIVITAMIN) tablet Take 1 tablet by mouth daily.   Yes [provider]  omeprazole (PRILOSEC) 40 MG capsule Take 1 capsule (40 mg total) by mouth daily. 10/10/16  Yes Orlena Sheldon, PA-C  OVER THE COUNTER MEDICATION Vitamin B12   Yes [provider]  OVER THE COUNTER MEDICATION Vitamin d 3   Yes [provider]  solifenacin (VESICARE) 10 MG tablet Take 1 tablet (10 mg total) by mouth daily. 08/22/16  Yes Dena Billet B, PA-C  cephALEXin (KEFLEX) 500 MG capsule Take 1 capsule (500 mg total) by mouth 2 (two) times daily for 5 days. 04/19/17 04/24/17  Delsa Grana, PA-C     Allergies  Allergen Reactions  . Cephalosporins Diarrhea  . Codeine Itching  . Latex Itching  . Pravastatin     Myalgias  . Simvastatin  myalgias  . Sulfa Antibiotics Diarrhea     Family History  Problem Relation Age of Onset  . Pulmonary fibrosis Father   . Cancer Sister        pancreatic  . Diabetes Brother   . Colon cancer Neg Hx      Social History   Socioeconomic History  . Marital status: Divorced    Spouse name: Not on file  . Number of children: Not on file  . Years of education: Not on file  . Highest education level: Not on file  Occupational History  . Not on file  Social Needs  . Financial resource strain: Not on file  . Food insecurity:    Worry: Not on file    Inability: Not on file  . Transportation needs:    Medical: Not on file    Non-medical: Not on file  Tobacco Use  . Smoking status: Never Smoker  . Smokeless tobacco: Never Used    Substance and Sexual Activity  . Alcohol use: Yes    Alcohol/week: 3.6 oz    Types: 6 Cans of beer per week    Comment: 6  . Drug use: No  . Sexual activity: Not on file  Lifestyle  . Physical activity:    Days per week: Not on file    Minutes per session: Not on file  . Stress: Not on file  Relationships  . Social connections:    Talks on phone: Not on file    Gets together: Not on file    Attends religious service: Not on file    Active member of club or organization: Not on file    Attends meetings of clubs or organizations: Not on file    Relationship status: Not on file  . Intimate partner violence:    Fear of current or ex partner: Not on file    Emotionally abused: Not on file    Physically abused: Not on file    Forced sexual activity: Not on file  Other Topics Concern  . Not on file  Social History Narrative  . Not on file     Review of Systems  All other systems reviewed and are negative.      Objective:    Vitals:   04/19/17 1034  BP: 110/74  Pulse: 62  Temp: 98.1 F (36.7 C)  TempSrc: Oral  SpO2: 95%  Weight: 196 lb 6 oz (89.1 kg)  Height: 5' 6.5" (1.689 m)     Physical Exam  Constitutional: She appears well-developed.  HENT:  Head: Normocephalic and atraumatic.  Nose: Nose normal.  Eyes: Conjunctivae are normal. Right eye exhibits no discharge. Left eye exhibits no discharge.  Neck: No tracheal deviation present.  Cardiovascular: Normal rate and regular rhythm.  Pulmonary/Chest: Effort normal. No stridor. No respiratory distress.  Musculoskeletal: Normal range of motion. She exhibits edema and tenderness.       Right hand: She exhibits tenderness and swelling. She exhibits normal range of motion and normal capillary refill. Normal sensation noted. Normal strength noted.       Hands: Small area (5x46mm) of edema, erythema, induration and tenderness to ulnar side of right fourth distal finger and fingernail.  Brown crusting at lateral nail  fold.  Neurological: She is alert. She exhibits normal muscle tone. Coordination normal.  Skin: Skin is warm and dry. No rash noted.  Psychiatric: She has a normal mood and affect. Her behavior is normal.  Nursing note and vitals reviewed.  Procedure:  Discussed exam findings consistent with paronychia.    Discussed treatment options.  Discussed I&D, risks/benefits of procedure, and pt consented to procedure.     Cleaned and prepped finger in usual sterile fashion.  Used 4 mls of 1% lidocaine without epi for digital block of right fourth finger.   Used scissors to blunt dissection and then #11 blade for incision of the swollen/fluctuant area.   0 cc purulence, very scant serosanguineous material expressed.   Cleaned the finger, covered with sterile bandage.   Patient tolerated procedure well.  EBL < 1cc.        Assessment & Plan:    1. Paronychia of right ring finger Mild cellulitis, patient did do some intra-mentation yesterday and had some purulent discharge, today had crusting and after I&D of paronychia, no purulence was expressed however indurated area became much softer after soaking and after procedure. Discussed warm soaks and keeping the area open for the next 3 days, will cover with keflex for the mild surrounding cellulitis.  She does have a documented side effect of diarrhea cephalosporins, chose a slightly lower dose due to the mild nature of the cellulitis, the procedure and frank purulence that she was able to express yesterday which is a primary treatment of paronychia, and also discussed with patient taking probiotics, taking with food, supplementing with yogurt with active yeast cultures etc. - cephALEXin (KEFLEX) 500 MG capsule; Take 1 capsule (500 mg total) by mouth 2 (two) times daily for 5 days.  Dispense: 10 capsule; Refill: 0   Over-the-counter medicines as needed for pain.  Patient should improve in the next 2-3 days.  Was asked to return for recheck if not  improving or contact us immediately if worsening.  Delsa Grana, PA-C 04/19/17 11:38 AM

## 2017-04-24 DIAGNOSIS — H903 Sensorineural hearing loss, bilateral: Secondary | ICD-10-CM | POA: Diagnosis not present

## 2017-04-24 DIAGNOSIS — H9042 Sensorineural hearing loss, unilateral, left ear, with unrestricted hearing on the contralateral side: Secondary | ICD-10-CM | POA: Diagnosis not present

## 2017-04-26 ENCOUNTER — Other Ambulatory Visit: Payer: Self-pay | Admitting: Otolaryngology

## 2017-04-27 ENCOUNTER — Other Ambulatory Visit: Payer: Self-pay | Admitting: Physician Assistant

## 2017-04-27 DIAGNOSIS — F419 Anxiety disorder, unspecified: Secondary | ICD-10-CM

## 2017-04-29 NOTE — Telephone Encounter (Signed)
Last OV 10/10/2016 Last refill 03/27/2017 Ok to refill?

## 2017-04-30 ENCOUNTER — Other Ambulatory Visit: Payer: Self-pay | Admitting: Otolaryngology

## 2017-04-30 DIAGNOSIS — IMO0001 Reserved for inherently not codable concepts without codable children: Secondary | ICD-10-CM

## 2017-04-30 DIAGNOSIS — H9042 Sensorineural hearing loss, unilateral, left ear, with unrestricted hearing on the contralateral side: Secondary | ICD-10-CM

## 2017-05-08 ENCOUNTER — Ambulatory Visit
Admission: RE | Admit: 2017-05-08 | Discharge: 2017-05-08 | Disposition: A | Discharge status: Home / Self Care | Payer: BLUE CROSS/BLUE SHIELD | Source: Ambulatory Visit | Attending: Otolaryngology | Admitting: Otolaryngology

## 2017-05-08 DIAGNOSIS — H9042 Sensorineural hearing loss, unilateral, left ear, with unrestricted hearing on the contralateral side: Secondary | ICD-10-CM | POA: Diagnosis not present

## 2017-05-08 DIAGNOSIS — IMO0001 Reserved for inherently not codable concepts without codable children: Secondary | ICD-10-CM

## 2017-05-08 MED ORDER — GADOBENATE DIMEGLUMINE 529 MG/ML IV SOLN
18.0000 mL | Freq: Once | INTRAVENOUS | Status: AC | PRN
  Administered 2017-05-08: 18 mL via INTRAVENOUS

## 2017-05-10 ENCOUNTER — Other Ambulatory Visit: Payer: Self-pay | Admitting: Physician Assistant

## 2017-05-16 ENCOUNTER — Telehealth: Payer: Self-pay

## 2017-05-16 NOTE — Telephone Encounter (Signed)
Prior authorization started on Solifenacin 10 mg tablets  Your information has been submitted to [place]. [employer] [place] will review the request and fax you a determination directly, typically within 3 business days of your submission once all necessary information is received.  If [employer] has not responded in 3 business days or if you have any questions about your submission, contact [place] at [phone].

## 2017-05-23 DIAGNOSIS — M7061 Trochanteric bursitis, right hip: Secondary | ICD-10-CM | POA: Diagnosis not present

## 2017-05-23 DIAGNOSIS — M1611 Unilateral primary osteoarthritis, right hip: Secondary | ICD-10-CM | POA: Diagnosis not present

## 2017-05-23 NOTE — Telephone Encounter (Signed)
Online submission 05/16/2017 11:40 AM Solifenacin Succinate 10MG  request, no PA is required paid claim for [employer] on 05/16/17 will cancel case and advise provider....Marland KitchenMarland KitchenKmcAdoo

## 2017-06-13 ENCOUNTER — Other Ambulatory Visit: Payer: Self-pay | Admitting: Physician Assistant

## 2017-06-13 DIAGNOSIS — M170 Bilateral primary osteoarthritis of knee: Secondary | ICD-10-CM | POA: Diagnosis not present

## 2017-06-13 DIAGNOSIS — Z471 Aftercare following joint replacement surgery: Secondary | ICD-10-CM | POA: Diagnosis not present

## 2017-06-13 DIAGNOSIS — Z96653 Presence of artificial knee joint, bilateral: Secondary | ICD-10-CM | POA: Diagnosis not present

## 2017-06-19 ENCOUNTER — Other Ambulatory Visit: Payer: Self-pay | Admitting: Physician Assistant

## 2017-06-26 ENCOUNTER — Ambulatory Visit (INDEPENDENT_AMBULATORY_CARE_PROVIDER_SITE_OTHER): Payer: BLUE CROSS/BLUE SHIELD | Admitting: Physician Assistant

## 2017-06-26 ENCOUNTER — Encounter: Payer: Self-pay | Admitting: Physician Assistant

## 2017-06-26 ENCOUNTER — Other Ambulatory Visit: Payer: Self-pay

## 2017-06-26 VITALS — BP 110/74 | HR 66 | Temp 98.2°F | Resp 14 | Ht 66.5 in | Wt 200.6 lb

## 2017-06-26 DIAGNOSIS — F419 Anxiety disorder, unspecified: Secondary | ICD-10-CM | POA: Diagnosis not present

## 2017-06-26 DIAGNOSIS — M150 Primary generalized (osteo)arthritis: Secondary | ICD-10-CM

## 2017-06-26 DIAGNOSIS — E7800 Pure hypercholesterolemia, unspecified: Secondary | ICD-10-CM

## 2017-06-26 DIAGNOSIS — Z01818 Encounter for other preprocedural examination: Secondary | ICD-10-CM

## 2017-06-26 DIAGNOSIS — M159 Polyosteoarthritis, unspecified: Secondary | ICD-10-CM

## 2017-06-26 DIAGNOSIS — M8949 Other hypertrophic osteoarthropathy, multiple sites: Secondary | ICD-10-CM

## 2017-06-26 DIAGNOSIS — N3281 Overactive bladder: Secondary | ICD-10-CM

## 2017-06-26 NOTE — Progress Notes (Addendum)
Patient ID: Kristi Avila MRN: [id number], DOB: 1956/01/13, 62 y.o. Date of Encounter: @DATE @  Chief Complaint:  Chief Complaint  Patient presents with  . surgery clearance    HPI: 62 y.o. year old female  presents for Pre-operative visit.   06/26/2017: Today she presents for preoperative clearance.  We recently received preoperative clearance form from Dr. Maureen Ralphs for her to undergo right THA-anterior.  This is tentatively scheduled to be performed 07/30/2017. They are requesting preoperative physical, EKG, labs and urinalysis.  Today she states that she has been feeling stable from a medical standpoint. She denies any angina symptoms.  Even with physical exertion has had no chest pressure heaviness tightness or squeezing.  Has noticed no increased shortness of breath or dyspnea with exertion.  Has had no syncope.  No PND or orthopnea.  The following information is pulled forward from her prior office notes with me and update her chronic ongoing medical problems.       She sees the following medical providers on a routine basis/recently: Dr. McDirmiad, Urologist Says that she sees a dermatologist once a year Also sees gynecologist Dr. Ronita Hipps says she has seen GI in [facility] but only for EGD and colonoscopy She has had TKR by Dr. Maureen Ralphs in past--had one done then the other done at a later date  Hyperlipidemia:   simvastatin and pravastatin caused significant myalgias. She is now on Zetia and tolerating this. She's currently taking over-the-counter fish oil. 06/26/2017: She is continuing to take the Zetia and the fish oil.   OAB: 08/2014 OV :  She sees Urology. She is on both Myrbetriq and oxybutynin.           03/2015 OV---She states that her gynecologist recommended her stop the oxybutynin. Says that they also discussed possibly increasing dose of Myrbetrique. She states that she is waking up multiple times                                during the night urinating. Is interested  in increasing the dose of Myrbetrique.             06/26/2017--- she continues to see urology. She continues Myrbetriq  Anxiety/depression she is  still on the Cymbalta 60 mg 2 tablet daily. This continues to control her anxiety and depression and her mood is stable on medication.She has no adverse effects to this medication.               She says that she rarely uses Xanax. Says that she only uses 2 or 3 per month.  10/10/16: She reports that her father did pass away last week after being with hospice. Says that it has been difficult and plus she has been having to help her mother deal with things. Says that her mom  uses a walker and has a lot of medical problems herself. Says that her brother does help. Discussed giving her some more Xanax to have available if she needs them for short short-term. In the past up  until now she has been taking just one Xanax at night for sleep but says that she has needed one during the day sometimes recently dealing with things. 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  06/26/2017: Today she states that actually her 43 year old mom who walks with  a walker is going to be the one taking care of her after her hip replacement surgery !  Says that things have been very busy  for her.  Has found out that the people in her rental house are going to be moving out right around the time of her hip surgery and then she is going to be needing to do some work and repairs on that  rental house.  Says that her boyfriend that she had lived with for about 15 years that their relationship finally is over.  Says that work has been very busy.  Says that recently on the weekends she has  been sleeping a lot.  Says that she thinks a lot of it is that she feels overwhelmed with all of these things that she probably should be doing.  However also says that she thinks some of it is that she is just   exhausted from work etc. and in the past she had her boyfriend they are keeping her active and made her keep moving and doing things and it finally her body can just relax and rest.  However notes that  she has only been taking 1 Cymbalta per day and is wondering whether she should go up to 2 of those to see if that helps.  Discussed that she will try going back up to taking 2/day on the Cymbalta.   Follow-up with me if needed.  Weight Management: 08/2014---She reported that she did a weight loss program for 12 weeks. Says that included some supplements as well as a very strict diet for 12 weeks. Says that she completed that 12 week treatment one month ago. Says that she is still on some maintenance supplement and trying to eat healthy and keep maintain her weight loss. Weight at office visit 02/15/2014 was 222. Weight today 08/23/2014 is 196. Lost 26 pounds. 03/2015----she reports that she is now doing weight watchers. Says that she started gaining the weight back and is in Weight Watchers now to try to help maintain this. 10/12/2015--she reports that she is still doing weight watchers. Weight at office visit with me 03/2015 was 207. Weight today 187. Down 20 pounds !!!  --------------- she states that her niece who was in her 14s is going with her to weight watchers. 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 Says that she goes to Weight Watchers meeting every Tuesday night. Is at most --------------one meeting since her last visit with me. Says that she loves it. Says that she continued plans to continue going every Tuesday night forever. Says that she has made great friends there and they really --------------enjoying each other and exchanging recipes etc. Says that he keeps her very motivated. Says that she feels the best she has felt in 20 years. 10/10/2016----reviewed that her weight has remained stable at today's visit compared to last visit. 04/26/16 weight was 191. Today 193.8. Especially given the recent death of  her father. However she states that she       continued to eat her salads and continued to eat healthy even when going through that time.  10/10/2016--She does look like she feels good today. Seems very energetic, in good spirits.  10/10/2016--says that she recently saw Ronald Lobo when she went to [place]. Learned that my husband works there and that Richardson Landry sees me at the Christmas parties there.  10/10/2016--she asked if I can give her refill on Cipro to use as needed when she has sex. States that she  uses this when has sex to prevent UTI.  No other concerns today.    Past Medical History:  Diagnosis Date  . Arthritis   . Barrett's esophagus with esophagitis 07/23/12   EGD: CLO test results negative. pathology: [facility]: Repeat EGD 3 years  . Depression   . Family history of anesthesia complication    mother trouble waking up  . Frequency of urination   . GERD (gastroesophageal reflux disease)    h/o peptic ulcer  . High cholesterol   . HSV-2 infection    anal  . Nocturia   . Obesity   . Overactive bladder   . Reflux   . Urgency of urination      Home Meds:  Outpatient Medications Prior to Visit  Medication Sig Dispense Refill  . acyclovir (ZOVIRAX) 400 MG tablet Take 1 tablet (400 mg total) by mouth 2 (two) times daily. 180 tablet 1  . ALPRAZolam (XANAX) 0.25 MG tablet TAKE 1 TABLET BY MOUTH TWICE DAILY AS NEEDED FOR ANXIETY 60 tablet 0  . DULoxetine (CYMBALTA) 60 MG capsule Take 2 capsules (120 mg total) by mouth daily. 180 capsule 1  . ezetimibe (ZETIA) 10 MG tablet Take 1 tablet (10 mg total) by mouth every morning. 90 tablet 2  . fluticasone (FLONASE) 50 MCG/ACT nasal spray SHAKE LIQUID AND USE 1 SPRAY IN EACH NOSTRIL DAILY 16 g 11  . meloxicam (MOBIC) 15 MG tablet Take 1 tablet (15 mg total) by mouth daily. 90 tablet 2  . metoCLOPramide (REGLAN) 10 MG tablet TAKE 1 TABLET BY MOUTH THREE TIMES DAILY BEFORE MEALS 90 tablet 0  . Multiple Vitamin  (MULTIVITAMIN) tablet Take 1 tablet by mouth daily.    Marland Kitchen omeprazole (PRILOSEC) 40 MG capsule Take 1 capsule (40 mg total) by mouth daily. 90 capsule 2  . OVER THE COUNTER MEDICATION Vitamin B12    . OVER THE COUNTER MEDICATION Vitamin d 3    . solifenacin (VESICARE) 10 MG tablet Take 1 tablet (10 mg total) by mouth daily. 90 tablet 4   No facility-administered medications prior to visit.     Allergies:  Allergies  Allergen Reactions  . Cephalosporins Diarrhea  . Codeine Itching  . Latex Itching  . Pravastatin     Myalgias  . Simvastatin     myalgias  . Sulfa Antibiotics Diarrhea    Social History   Socioeconomic History  . Marital status: Divorced    Spouse name: Not on file  . Number of children: Not on file  . Years of education: Not on file  . Highest education level: Not on file  Occupational History  . Not on file  Social Needs  . Financial resource strain: Not on file  . Food insecurity:    Worry: Not on file    Inability: Not on file  . Transportation needs:    Medical: Not on file    Non-medical: Not on file  Tobacco Use  . Smoking status: Never Smoker  . Smokeless tobacco: Never Used  Substance and Sexual Activity  . Alcohol use: Yes    Alcohol/week: 3.6 oz    Types: 6 Cans of beer per week    Comment: 6  . Drug use: No  . Sexual activity: Not on file  Lifestyle  . Physical activity:    Days per week: Not on file    Minutes per session: Not on file  . Stress: Not on file  Relationships  . Social connections:  Talks on phone: Not on file    Gets together: Not on file    Attends religious service: Not on file    Active member of club or organization: Not on file    Attends meetings of clubs or organizations: Not on file    Relationship status: Not on file  . Intimate partner violence:    Fear of current or ex partner: Not on file    Emotionally abused: Not on file    Physically abused: Not on file    Forced sexual activity: Not on file   Other Topics Concern  . Not on file  Social History Narrative  . Not on file    Family History  Problem Relation Age of Onset  . Pulmonary fibrosis Father   . Cancer Sister        pancreatic  . Diabetes Brother   . Colon cancer Neg Hx      Review of Systems:  See HPI for pertinent ROS. All other ROS negative.   Physical Exam: Blood pressure 110/74, pulse 66, temperature 98.2 F (36.8 C), temperature source Oral, resp. rate 14, height 5' 6.5" (1.689 m), weight 91 kg (200 lb 9.6 oz), SpO2 97 %., Body mass index is 31.89 kg/m. General: WF. Appears in no acute distress. Head: Normocephalic, atraumatic, eyes without discharge, sclera non-icteric, nares are without discharge. Bilateral auditory canals clear, TM's are without perforation, pearly grey and translucent with reflective cone of light bilaterally. Oral cavity moist, posterior pharynx without exudate, erythema.  Neck: Supple. No thyromegaly. No lymphadenopathy. No carotid bruit. Lungs: Clear bilaterally to auscultation without wheezes, rales, or rhonchi. Breathing is unlabored. Heart: RRR with S1 S2. No murmurs, rubs, or gallops. Abdomen: Soft, non-tender, non-distended with normoactive bowel sounds. No hepatomegaly. No rebound/guarding. No obvious abdominal masses. Musculoskeletal:  Strength and tone normal for age. Extremities/Skin: Warm and dry. No clubbing or cyanosis. No edema. No rashes or suspicious lesions. Neuro: Alert and oriented X 3. Moves all extremities spontaneously. Gait is normal. CNII-XII grossly in tact. Psych:  Responds to questions appropriately with a normal affect.    EKG: Normal sinus rhythm.  Nonspecific ST-T change.  ASSESSMENT AND PLAN:  62 y.o. year old female with   1. Preoperative clearance She always has her labs drawn at another facility where she can go early in the morning or even on Saturdays.   Today I have given her lab order slip to have labs drawn there.   I will follow-up on  getting these results and if they are stable/normal then we will send in her preoperative clearance.   EKG is WNL.  She is having no angina symptoms.  Blood pressure is well controlled. - EKG 12-Lead  Addendum added 07/03/2017----received labs from [facility].  Labs were obtained at an outside facility.  Collected on 06/29/2017. Total cholesterol---- 199 HDL --------------------64 Triglycerides-------- 69 LDL------------------- 119  CMET is normal.  A1c--------------------- 4.8.  CBC normal.  Urinalysis normal.  Surgical clearance will be sent to Dr. Maureen Ralphs.    High cholesterol She is intolerant to statins. Continue Zetia and fish oil. For labs--she always requests to get lab order and get lab drawn at location on [address] -- this is more convenient for her to do fasting. She states that she has to be at work prior to the time of our office opens. Last labs  excellent---cont Zetia, Weight Watchers/diet changes 06/26/2017: Will obtain fasting labs and follow-up those results.   overactive bladder Symptoms are stable, controlled.  Cont Myrbetriq  Obesity Have discussed diet and exercise multiple times. At [place] 08/23/2014 she reported she had done intense 12 week weight loss program At [facility] 03/2015---she is now doing Weight Watchers to help maintain weight loss. At [place] 09/2015--she is continuing to do Weight Watchers. Has lost 20 pounds since her visit with me 6 months ago-- 03/2015 At [place] 04/2016--Says that she is on weight watchers and is maintaining. Not really losing at this point but is at least trying to maintain. At [place] 09/2016-- she is on weight watchers and is maintaining. Not really losing at this point but is at least trying to maintain.   Anxiety Continue current dose of Cymbalta. Cont Xanax PRN At [place] 09/2016--increase the Xanax to where she can use 1 twice daily as needed increase quantity #60--- prior to this it had been just use 1 at bedtime for #30 06/26/2017: She will  increase Cymbalta to taking 2/day.  She was taking 2 daily in the past but then at some point on her own had decreased to just 1 daily.  Depression Stable/controlled Continue current dose of Cymbalta 06/26/2017: She will increase Cymbalta to taking 2/day.  She was taking 2 daily in the past but then at some point on her own had decreased to just 1 daily.  GERD (gastroesophageal reflux disease) Stable/controlled with current treatment.  Osteoarthritis of multiple joints She has had knee replacements bilaterally. She uses Mobic with food PRN  HSV-2 infection Stable, controlled---cont acyclovir  OA (osteoarthritis) of knee She has had knee replacements bilaterally. She uses Mobic with food PRN  Barrett's esophagus with esophagitis Per GI     Colonoscopy:  6 /15/2007. Negative. Repeat 10 years.                        --- Visit 09/2015-- she reports that she did have follow-up colonoscopy and was told that she can wait to repeat 10 years. Immunizations:Tdap  Given here 06/04/2011 `  Zostavax----She has received this   F/U OV 6 months, sooner if needed.   Signed, [address] [place], [place], [facility] 06/26/2017 2:22 PM

## 2017-06-29 ENCOUNTER — Encounter: Payer: Self-pay | Admitting: Physician Assistant

## 2017-07-11 DIAGNOSIS — M7062 Trochanteric bursitis, left hip: Secondary | ICD-10-CM | POA: Diagnosis not present

## 2017-07-11 DIAGNOSIS — M1611 Unilateral primary osteoarthritis, right hip: Secondary | ICD-10-CM | POA: Diagnosis not present

## 2017-07-12 ENCOUNTER — Telehealth: Payer: Self-pay

## 2017-07-12 NOTE — Telephone Encounter (Signed)
Call placed to patient provided her with her normal lab results.Faxed over EKG and lab results for patient preop surgery with [facility]

## 2017-07-20 ENCOUNTER — Other Ambulatory Visit: Payer: Self-pay | Admitting: Physician Assistant

## 2017-07-30 ENCOUNTER — Other Ambulatory Visit: Payer: Self-pay | Admitting: Physician Assistant

## 2017-07-30 DIAGNOSIS — M1611 Unilateral primary osteoarthritis, right hip: Secondary | ICD-10-CM | POA: Diagnosis not present

## 2017-08-14 ENCOUNTER — Other Ambulatory Visit: Payer: Self-pay

## 2017-08-14 DIAGNOSIS — F419 Anxiety disorder, unspecified: Secondary | ICD-10-CM

## 2017-08-14 MED ORDER — SOLIFENACIN SUCCINATE 10 MG PO TABS: 10.0000 mg | ORAL_TABLET | Freq: Every day | ORAL | 4 refills | Status: DC

## 2017-08-14 MED ORDER — ALPRAZOLAM 0.25 MG PO TABS: 0.2500 mg | ORAL_TABLET | Freq: Two times a day (BID) | ORAL | 0 refills | Status: DC | PRN

## 2017-08-14 NOTE — Telephone Encounter (Signed)
Last OV 06/26/2017 Last refill on xanax 04/29/2017 Ok to refill?

## 2017-09-03 DIAGNOSIS — Z85828 Personal history of other malignant neoplasm of skin: Secondary | ICD-10-CM | POA: Diagnosis not present

## 2017-09-03 DIAGNOSIS — D692 Other nonthrombocytopenic purpura: Secondary | ICD-10-CM | POA: Diagnosis not present

## 2017-09-03 DIAGNOSIS — L570 Actinic keratosis: Secondary | ICD-10-CM | POA: Diagnosis not present

## 2017-09-03 DIAGNOSIS — L820 Inflamed seborrheic keratosis: Secondary | ICD-10-CM | POA: Diagnosis not present

## 2017-09-03 DIAGNOSIS — C44729 Squamous cell carcinoma of skin of left lower limb, including hip: Secondary | ICD-10-CM | POA: Diagnosis not present

## 2017-09-16 ENCOUNTER — Other Ambulatory Visit: Payer: Self-pay | Admitting: Physician Assistant

## 2017-10-26 ENCOUNTER — Other Ambulatory Visit: Payer: Self-pay | Admitting: Physician Assistant

## 2017-11-05 ENCOUNTER — Other Ambulatory Visit: Payer: Self-pay | Admitting: Physician Assistant

## 2017-11-13 ENCOUNTER — Other Ambulatory Visit: Payer: Self-pay | Admitting: Physician Assistant

## 2017-11-22 DIAGNOSIS — Z85828 Personal history of other malignant neoplasm of skin: Secondary | ICD-10-CM | POA: Diagnosis not present

## 2017-11-22 DIAGNOSIS — L309 Dermatitis, unspecified: Secondary | ICD-10-CM | POA: Diagnosis not present

## 2017-11-22 DIAGNOSIS — L820 Inflamed seborrheic keratosis: Secondary | ICD-10-CM | POA: Diagnosis not present

## 2017-11-26 ENCOUNTER — Other Ambulatory Visit: Payer: Self-pay | Admitting: Physician Assistant

## 2017-11-26 DIAGNOSIS — F419 Anxiety disorder, unspecified: Secondary | ICD-10-CM

## 2017-11-26 NOTE — Telephone Encounter (Signed)
Ok to refill??  Last office visit 06/26/2017.   Last refill 08/14/2017.

## 2017-12-18 DIAGNOSIS — R350 Frequency of micturition: Secondary | ICD-10-CM | POA: Diagnosis not present

## 2017-12-18 DIAGNOSIS — N3942 Incontinence without sensory awareness: Secondary | ICD-10-CM | POA: Diagnosis not present

## 2017-12-18 DIAGNOSIS — R351 Nocturia: Secondary | ICD-10-CM | POA: Diagnosis not present

## 2017-12-20 ENCOUNTER — Other Ambulatory Visit: Payer: Self-pay | Admitting: Family Medicine

## 2017-12-25 ENCOUNTER — Other Ambulatory Visit: Payer: Self-pay | Admitting: *Deleted

## 2017-12-25 MED ORDER — OMEPRAZOLE 40 MG PO CPDR: 40.0000 mg | DELAYED_RELEASE_CAPSULE | Freq: Every day | ORAL | 2 refills | Status: DC

## 2017-12-26 DIAGNOSIS — Z124 Encounter for screening for malignant neoplasm of cervix: Secondary | ICD-10-CM | POA: Diagnosis not present

## 2017-12-26 DIAGNOSIS — Z6833 Body mass index (BMI) 33.0-33.9, adult: Secondary | ICD-10-CM | POA: Diagnosis not present

## 2017-12-26 DIAGNOSIS — Z01419 Encounter for gynecological examination (general) (routine) without abnormal findings: Secondary | ICD-10-CM | POA: Diagnosis not present

## 2017-12-27 LAB — HM PAP SMEAR: HM Pap smear: NEGATIVE

## 2017-12-30 DIAGNOSIS — Z1231 Encounter for screening mammogram for malignant neoplasm of breast: Secondary | ICD-10-CM | POA: Diagnosis not present

## 2017-12-30 LAB — HM MAMMOGRAPHY

## 2018-01-02 DIAGNOSIS — M5136 Other intervertebral disc degeneration, lumbar region: Secondary | ICD-10-CM | POA: Diagnosis not present

## 2018-01-13 ENCOUNTER — Telehealth: Payer: Self-pay | Admitting: Physician Assistant

## 2018-01-13 MED ORDER — OMEPRAZOLE 40 MG PO CPDR: 40.0000 mg | DELAYED_RELEASE_CAPSULE | Freq: Every day | ORAL | 2 refills | Status: DC

## 2018-01-13 NOTE — Telephone Encounter (Signed)
Medication sent into pharmacy  

## 2018-01-13 NOTE — Telephone Encounter (Signed)
Received fax from [facility] (second refill request)  Pharmacy:[facility] [address]  Medication:ezetimibe 10 mg tablets  Qty:90  ZXY:DSWV 1 tablet by mouth every morning  Physician:Olean Ree Dixon  Last filled: 07/30/2017  Patient's phone [phone]

## 2018-01-18 ENCOUNTER — Other Ambulatory Visit: Payer: Self-pay | Admitting: Family Medicine

## 2018-01-21 ENCOUNTER — Encounter: Payer: Self-pay | Admitting: Family Medicine

## 2018-01-21 ENCOUNTER — Ambulatory Visit (INDEPENDENT_AMBULATORY_CARE_PROVIDER_SITE_OTHER): Payer: BLUE CROSS/BLUE SHIELD | Admitting: Family Medicine

## 2018-01-21 VITALS — BP 122/76 | HR 73 | Temp 98.4°F | Resp 16 | Ht 66.5 in | Wt 208.4 lb

## 2018-01-21 DIAGNOSIS — J069 Acute upper respiratory infection, unspecified: Secondary | ICD-10-CM | POA: Diagnosis not present

## 2018-01-21 DIAGNOSIS — F419 Anxiety disorder, unspecified: Secondary | ICD-10-CM | POA: Diagnosis not present

## 2018-01-21 DIAGNOSIS — J029 Acute pharyngitis, unspecified: Secondary | ICD-10-CM | POA: Diagnosis not present

## 2018-01-21 MED ORDER — PREDNISONE 20 MG PO TABS: ORAL_TABLET | ORAL | 0 refills | Status: DC

## 2018-01-21 MED ORDER — CETIRIZINE HCL 10 MG PO TBDP: 10.0000 mg | ORAL_TABLET | Freq: Every day | ORAL | 0 refills | Status: DC

## 2018-01-21 MED ORDER — ALPRAZOLAM 0.25 MG PO TABS: ORAL_TABLET | ORAL | 0 refills | Status: DC

## 2018-01-21 MED ORDER — BENZONATATE 100 MG PO CAPS: 100.0000 mg | ORAL_CAPSULE | Freq: Three times a day (TID) | ORAL | 0 refills | Status: DC | PRN

## 2018-01-21 NOTE — Progress Notes (Signed)
Patient ID: Kristi Avila, female    DOB: 24-Kristi-1957, 63 y.o.   MRN: [id number]  PCP: Alycia Rossetti, MD  Chief Complaint  Patient presents with  . Cough    Patient has c/o sore throat, hoarseness, productive cough, and swollen glands. Onset 6 days ago.   Subjective:   Kristi Avila is a 63 y.o. female, presents to clinic with CC of 6 days of sore throat, scratchy voice.  Throat feels raw and sore with swollen glands.  Cough started, productive brown, green and yellow.  Not much nasal congestion or discharge.  She has tried saline spray and sudafed- cold and cough PE?  [facility]  No meds for cough.  Started taking amoxicillin yesterday because she "had some left over." Initially she had swollen glands under jaw, neck lymph nodes got better,  feels generally fatigued Nose stuffy but not much discharge. No fevers, sweats, chills, body aches, HA, normal appetite, normal bowels  Niece was sick     Patient Active Problem List   Diagnosis Date Noted  . Chronic constipation 10/12/2015  . Arthritis   . Barrett's esophagus with esophagitis   . OA (osteoarthritis) of knee 09/22/2012  . Osteoarthritis of multiple joints 09/12/2012  . HSV-2 infection   . High cholesterol   . Overactive bladder   . Obesity   . Anxiety   . Depression   . GERD (gastroesophageal reflux disease)      Prior to Admission medications   Medication Sig Start Date End Date Taking? Authorizing Provider  acyclovir (ZOVIRAX) 400 MG tablet TAKE 1 TABLET BY MOUTH TWICE DAILY 11/06/17  Yes Susy Frizzle, MD  ALPRAZolam (XANAX) 0.25 MG tablet TAKE 1 TABLET(0.25 MG) BY MOUTH TWICE DAILY AS NEEDED FOR ANXIETY 11/26/17  Yes Susy Frizzle, MD  DULoxetine (CYMBALTA) 60 MG capsule TAKE 2 CAPSULES BY MOUTH DAILY 10/28/17  Yes Dena Billet B, PA-C  ezetimibe (ZETIA) 10 MG tablet TAKE 1 TABLET(10 MG) BY MOUTH EVERY MORNING 01/20/18  Yes [place], Modena Nunnery, MD  fluticasone [facility]) 50 MCG/ACT nasal spray SHAKE  LIQUID AND USE 1 SPRAY IN EACH NOSTRIL DAILY 06/20/17  Yes Dena Billet B, PA-C  meloxicam (MOBIC) 15 MG tablet TAKE 1 TABLET(15 MG) BY MOUTH DAILY 11/13/17  Yes Susy Frizzle, MD  metoCLOPramide (REGLAN) 10 MG tablet TAKE 1 TABLET BY MOUTH THREE TIMES DAILY BEFORE MEALS 12/20/17  Yes Susy Frizzle, MD  Multiple Vitamin (MULTIVITAMIN) tablet Take 1 tablet by mouth daily.   Yes [provider]  omeprazole (PRILOSEC) 40 MG capsule Take 1 capsule (40 mg total) by mouth daily. 01/13/18  Yes [place], Modena Nunnery, MD  OVER THE COUNTER MEDICATION Vitamin B12   Yes [provider]  OVER THE COUNTER MEDICATION Vitamin d 3   Yes [provider]  solifenacin (VESICARE) 10 MG tablet Take 1 tablet (10 mg total) by mouth daily. 08/14/17  Yes Orlena Sheldon, PA-C  tamsulosin (FLOMAX) 0.4 MG CAPS capsule Take 0.4 mg by mouth.   Yes [provider]     Allergies  Allergen Reactions  . Cephalosporins Diarrhea  . Codeine Itching  . Latex Itching  . Pravastatin     Myalgias  . Simvastatin     myalgias  . Sulfa Antibiotics Diarrhea     Family History  Problem Relation Age of Onset  . Pulmonary fibrosis Father   . Cancer Sister        pancreatic  . Diabetes Brother   .  Colon cancer Neg Hx      Social History   Socioeconomic History  . Marital status: Divorced    Spouse name: Not on file  . Number of children: Not on file  . Years of education: Not on file  . Highest education level: Not on file  Occupational History  . Not on file  Social Needs  . Financial resource strain: Not on file  . Food insecurity:    Worry: Not on file    Inability: Not on file  . Transportation needs:    Medical: Not on file    Non-medical: Not on file  Tobacco Use  . Smoking status: Never Smoker  . Smokeless tobacco: Never Used  Substance and Sexual Activity  . Alcohol use: Yes    Alcohol/week: 6.0 standard drinks    Types: 6 Cans of beer per week    Comment: 6  .  Drug use: No  . Sexual activity: Not on file  Lifestyle  . Physical activity:    Days per week: Not on file    Minutes per session: Not on file  . Stress: Not on file  Relationships  . Social connections:    Talks on phone: Not on file    Gets together: Not on file    Attends religious service: Not on file    Active member of club or organization: Not on file    Attends meetings of clubs or organizations: Not on file    Relationship status: Not on file  . Intimate partner violence:    Fear of current or ex partner: Not on file    Emotionally abused: Not on file    Physically abused: Not on file    Forced sexual activity: Not on file  Other Topics Concern  . Not on file  Social History Narrative  . Not on file     Review of Systems  Constitutional: Negative.   HENT: Negative.   Eyes: Negative.   Respiratory: Negative.   Cardiovascular: Negative.   Gastrointestinal: Negative.   Endocrine: Negative.   Genitourinary: Negative.   Musculoskeletal: Negative.   Skin: Negative.   Allergic/Immunologic: Negative.   Neurological: Negative.   Hematological: Negative.   Psychiatric/Behavioral: Negative.   All other systems reviewed and are negative.      Objective:    Vitals:   01/21/18 1110  BP: 122/76  Pulse: 73  Resp: 16  Temp: 98.4 F (36.9 C)  TempSrc: Oral  SpO2: 96%  Weight: 208 lb 6 oz (94.5 kg)  Height: 5' 6.5" (1.689 m)      Physical Exam Vitals signs and nursing note reviewed.  Constitutional:      General: She is not in acute distress.    Appearance: She is well-developed. She is not ill-appearing, toxic-appearing or diaphoretic.  HENT:     Head: Normocephalic and atraumatic.     Right Ear: Hearing, tympanic membrane, ear canal and external ear normal.     Left Ear: Hearing, tympanic membrane, ear canal and external ear normal.     Nose: Mucosal edema, congestion and rhinorrhea present.     Right Sinus: No maxillary sinus tenderness or frontal  sinus tenderness.     Left Sinus: No maxillary sinus tenderness or frontal sinus tenderness.     Mouth/Throat:     Mouth: Mucous membranes are moist. Mucous membranes are not pale.     Pharynx: Oropharynx is clear. Uvula midline. No oropharyngeal exudate or uvula swelling.  Tonsils: No tonsillar abscesses.  Eyes:     General:        Right eye: No discharge.        Left eye: No discharge.     Conjunctiva/sclera: Conjunctivae normal.     Pupils: Pupils are equal, round, and reactive to light.  Neck:     Musculoskeletal: Normal range of motion and neck supple.     Trachea: No tracheal deviation.  Cardiovascular:     Rate and Rhythm: Normal rate and regular rhythm.     Pulses: Normal pulses.     Heart sounds: Normal heart sounds. No murmur. No friction rub. No gallop.   Pulmonary:     Effort: Pulmonary effort is normal. No respiratory distress.     Breath sounds: Normal breath sounds. No stridor. No wheezing, rhonchi or rales.  Abdominal:     General: Bowel sounds are normal. There is no distension.     Palpations: Abdomen is soft.  Musculoskeletal: Normal range of motion.  Skin:    General: Skin is warm and dry.     Capillary Refill: Capillary refill takes less than 2 seconds.     Coloration: Skin is not pale.     Findings: No rash.  Neurological:     Mental Status: She is alert.     Motor: No abnormal muscle tone.     Coordination: Coordination normal.  Psychiatric:        Behavior: Behavior normal.            Assessment & Plan:      ICD-10-CM   1. Upper respiratory tract infection, unspecified type J06.9 STREP GROUP A AG, W/REFLEX TO CULT    Culture, Group A Strep    predniSONE (DELTASONE) 20 MG tablet    benzonatate (TESSALON) 100 MG capsule    Cetirizine HCl (ZYRTEC ALLERGY) 10 MG TBDP  2. Anxiety F41.9 ALPRAZolam (XANAX) 0.25 MG tablet    Patient is a 63 year old female, presents with URI symptoms, productive cough, scratchy sore throat, had swollen lymph  nodes but they are resolving, she feels generally fatigued and rundown but she is not having any fever sweats chills or shortness of breath.  I doubt strep pharyngitis but this test was obtained for I evaluated the patient the rapid strep is negative.  She has no signs of acute bacterial sinusitis or pneumonia lungs are clear to auscultation she is generally well-appearing.  I prescribed some cough medicines and short steroid burst for mild acute bronchitis, she does not have comorbidities concerning enough to start Z-Pak, I have advised her to stop taking the amoxicillin, suspect viral illness, supportive and symptomatic treatment and should rest from work for a few days until her fatigue and productive cough is more mild.    Did ask for a refill on her Xanax medication which was verified with frequency and dosing with chart review and with looking her up in the controlled substance database.  Patient encouraged to follow-up if not improving in 1 to 2 weeks.   Delsa Grana, PA-C 01/21/18 11:36 AM

## 2018-01-21 NOTE — Patient Instructions (Signed)
I would keep doing mucinex and saline nasal spray.  The decongestant you can also continue.  Add an antihistamine once daily at night - like zyrtec, claritin or allegra  Start the steroid  Use tylenol and ibuprofen (as tolerated) for throat pain.  Stop taking amoxicillin  Follow up if you do not improve gradaully in the next 1-2 weeks

## 2018-01-23 LAB — CULTURE, GROUP A STREP
MICRO NUMBER:: 21385
SPECIMEN QUALITY: ADEQUATE

## 2018-01-23 LAB — STREP GROUP A AG, W/REFLEX TO CULT: Streptococcus, Group A Screen (Direct): NOT DETECTED

## 2018-01-24 ENCOUNTER — Encounter: Payer: Self-pay | Admitting: Family Medicine

## 2018-02-01 ENCOUNTER — Other Ambulatory Visit: Payer: Self-pay | Admitting: Family Medicine

## 2018-02-01 ENCOUNTER — Other Ambulatory Visit: Payer: Self-pay | Admitting: *Deleted

## 2018-02-01 MED ORDER — DULOXETINE HCL 60 MG PO CPEP: 120.0000 mg | ORAL_CAPSULE | Freq: Every day | ORAL | 0 refills | Status: DC

## 2018-02-10 DIAGNOSIS — A609 Anogenital herpesviral infection, unspecified: Secondary | ICD-10-CM | POA: Diagnosis not present

## 2018-02-10 DIAGNOSIS — E559 Vitamin D deficiency, unspecified: Secondary | ICD-10-CM | POA: Diagnosis not present

## 2018-02-10 DIAGNOSIS — Z6832 Body mass index (BMI) 32.0-32.9, adult: Secondary | ICD-10-CM | POA: Diagnosis not present

## 2018-02-10 DIAGNOSIS — L989 Disorder of the skin and subcutaneous tissue, unspecified: Secondary | ICD-10-CM | POA: Diagnosis not present

## 2018-02-17 DIAGNOSIS — Z6832 Body mass index (BMI) 32.0-32.9, adult: Secondary | ICD-10-CM | POA: Diagnosis not present

## 2018-02-17 DIAGNOSIS — M170 Bilateral primary osteoarthritis of knee: Secondary | ICD-10-CM | POA: Diagnosis not present

## 2018-02-17 DIAGNOSIS — G4700 Insomnia, unspecified: Secondary | ICD-10-CM | POA: Diagnosis not present

## 2018-02-17 DIAGNOSIS — K219 Gastro-esophageal reflux disease without esophagitis: Secondary | ICD-10-CM | POA: Diagnosis not present

## 2018-03-07 ENCOUNTER — Ambulatory Visit: Payer: BLUE CROSS/BLUE SHIELD | Admitting: Medical

## 2018-03-07 ENCOUNTER — Encounter: Payer: Self-pay | Admitting: Medical

## 2018-03-07 VITALS — BP 122/76 | HR 71 | Temp 97.8°F | Resp 16 | Ht 66.25 in | Wt 204.8 lb

## 2018-03-07 DIAGNOSIS — K22719 Barrett's esophagus with dysplasia, unspecified: Secondary | ICD-10-CM | POA: Diagnosis not present

## 2018-03-07 DIAGNOSIS — G4700 Insomnia, unspecified: Secondary | ICD-10-CM

## 2018-03-07 DIAGNOSIS — K2100 Gastro-esophageal reflux disease with esophagitis, without bleeding: Secondary | ICD-10-CM

## 2018-03-07 DIAGNOSIS — F329 Major depressive disorder, single episode, unspecified: Secondary | ICD-10-CM

## 2018-03-07 DIAGNOSIS — E785 Hyperlipidemia, unspecified: Secondary | ICD-10-CM

## 2018-03-07 DIAGNOSIS — L989 Disorder of the skin and subcutaneous tissue, unspecified: Secondary | ICD-10-CM

## 2018-03-07 DIAGNOSIS — F32A Depression, unspecified: Secondary | ICD-10-CM

## 2018-03-07 DIAGNOSIS — M1990 Unspecified osteoarthritis, unspecified site: Secondary | ICD-10-CM

## 2018-03-07 DIAGNOSIS — F419 Anxiety disorder, unspecified: Secondary | ICD-10-CM | POA: Diagnosis not present

## 2018-03-07 MED ORDER — DEXLANSOPRAZOLE 60 MG PO CPDR: 60.0000 mg | DELAYED_RELEASE_CAPSULE | Freq: Every day | ORAL | 0 refills | Status: DC

## 2018-03-07 MED ORDER — VALACYCLOVIR HCL 500 MG PO TABS: 500.0000 mg | ORAL_TABLET | Freq: Every day | ORAL | 1 refills | Status: DC

## 2018-03-07 MED ORDER — DULOXETINE HCL 60 MG PO CPEP: 120.0000 mg | ORAL_CAPSULE | Freq: Every day | ORAL | 0 refills | Status: DC

## 2018-03-07 MED ORDER — EZETIMIBE 10 MG PO TABS: ORAL_TABLET | ORAL | 0 refills | Status: DC

## 2018-03-07 MED ORDER — ALPRAZOLAM 0.25 MG PO TABS: 0.2500 mg | ORAL_TABLET | Freq: Every day | ORAL | 0 refills | Status: DC

## 2018-03-07 MED ORDER — MELOXICAM 7.5 MG PO TABS: 7.5000 mg | ORAL_TABLET | Freq: Every day | ORAL | 0 refills | Status: DC

## 2018-03-07 NOTE — Progress Notes (Signed)
Subjective:     Patient ID: Kristi Avila, female   DOB: 1955/10/26, 63 y.o.   MRN: [id number]  HPI Chief Complaint  Patient presents with  . NP    NP get established med refills reflux issues  last blood work 06-29-17 last CPE 06-26-17   Was going to [place], seeing PA Kristi Avila there who just retired.   Transferred to Kristi Avila with different practice, decided to not go back.  Brother Kristi Avila comes here and recommended she establish here.    Medical Team: Dr. Hector Shade, [facility] Dr. Deatra Ina, GI before he retired; Dr. Harrell Lark prior GI Dr. Wonda Cerise, [facility] Dr. Ronita Hipps, [facility] Dr. Sherlean Foot, eye doctor Dr. Gerlene Burdock, dentist  Drinks a lot of caffeine, is overweight, knows she needs to work on things.   Has GERD, and lately this has been flaring up more.   Been drinking some beer with her neighbor and making spicy soups of late.  Currently taking Metoclopramide BID and Omeprazole once daily at bedtime.    Has hx/o incontinence, mild, uses Vesicare and Flomax.  Hx/o knee and hip replacement, sees Dr. Elmyra Ricks, [facility].   Her prior PCP was prescribing Cymbalta and Xanax.   Takes Cymbalta 60mg  2 tablets daily.  Uses 0.25mg  Xanax as a sleep aid, was prescribed 60 per month, but was just using at night time.   Father passed away this past year, and mother lives with her who is not doing well.   Is her caregiver.   Had boyfriend of 19 years that ran off and left her this past year.   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   Feels down at time for being alone currently without boyfriend.  Denies suicidal homicidal ideation.  Not seeing counseling  Using valacyclovir for herpes flareup.  Just with this recently.  Was on out acyclovir prior.  Using meloxicam 7.5 mg now.  Was on higher dose prior.  Needs med refills in general.     Review of Systems     Objective:   Physical Exam BP 122/76   Pulse 71   Temp 97.8 F (36.6 C) (Oral)   Resp 16   Ht 5' 6.25" (1.683 m)   Wt 204 lb 12.8 oz (92.9 kg)   SpO2 96%   BMI 32.81 kg/m    General appearance: alert, no distress, WD/WN, white female Skin: Left lower leg posteriorly just distal to popliteal area lateral surface with 4 mm erythematous lesion round, slight scabbing HEENT: normocephalic, sclerae anicteric, TMs pearly, nares patent, no discharge or erythema, pharynx normal Oral cavity: MMM, no lesions Neck: supple, no lymphadenopathy, no thyromegaly, no masses Heart: RRR, normal S1, S2, no murmurs Lungs: CTA bilaterally, no wheezes, rhonchi, or rales Abdomen: +bs, soft, non tender, non distended, no masses, no hepatomegaly, no splenomegaly Pulses: 2+ symmetric, upper and lower extremities, normal cap refill Ext: no edema      Assessment:     Encounter Diagnoses  Name Primary?  . Barrett's esophagus with dysplasia Yes  . Gastroesophageal reflux disease with esophagitis   . Anxiety and depression   . Hyperlipidemia, unspecified hyperlipidemia type   . Osteoarthritis, unspecified osteoarthritis type, unspecified site   . Insomnia, unspecified type   . Skin lesion        Plan:      Barrett's esophagus, GERD-begin trial of Dexilant, gave samples and prescription, stop omeprazole,  we will use Reglan once daily for 1 week then stop this given potential risk and side effects.  We will refer back to gastro if she is due for EGD given history of Barrett's esophagus.  Strongly encouraged her to cut back on alcohol and avoid GERD triggers.  Anxiety depression- discussed her concerns, continue current medications, limit Xanax use, seems to be doing reasonably well on Cymbalta, 2 tablets daily  Hyperlipidemia- continue same medicine, follow-up within 3 months for physical and fasting labs  Osteoarthritis- using meloxicam 7.5 mg lower dose for the last few months doing well on this.  Sees orthopedics  from time to time  Insomnia- cut back on alcohol, continue Xanax nightly asked for sleep aid  Skin lesion-advised Neosporin topically for 10 days then recheck if not completely resolving  [name] was seen today for np.  Diagnoses and all orders for this visit:  Barrett's esophagus with dysplasia -     Ambulatory referral to Gastroenterology  Gastroesophageal reflux disease with esophagitis -     Ambulatory referral to Gastroenterology  Anxiety and depression  Hyperlipidemia, unspecified hyperlipidemia type  Osteoarthritis, unspecified osteoarthritis type, unspecified site  Insomnia, unspecified type  Skin lesion  Other orders -     meloxicam (MOBIC) 7.5 MG tablet; Take 1 tablet (7.5 mg total) by mouth daily. -     ezetimibe (ZETIA) 10 MG tablet; TAKE 1 TABLET(10 MG) BY MOUTH EVERY MORNING -     DULoxetine (CYMBALTA) 60 MG capsule; Take 2 capsules (120 mg total) by mouth daily. -     valACYclovir (VALTREX) 500 MG tablet; Take 1 tablet (500 mg total) by mouth daily. -     dexlansoprazole (DEXILANT) 60 MG capsule; Take 1 capsule (60 mg total) by mouth daily. -     ALPRAZolam (XANAX) 0.25 MG tablet; Take 1 tablet (0.25 mg total) by mouth at bedtime.

## 2018-03-07 NOTE — Patient Instructions (Addendum)
Recommendations:  Reflux and history of Barrett's Esophagus  Begin Dexilant oral medication once daily in the morning  Use Reglan 1 tablet daily at bedtime for 1 week, then stop this  We will refer you back to gastroenterology for updated endoscopy given history of Barrett's Esophagus  Cut back on beer!  Limit or avoid acidic, spicy and citrus foods and beverages  I refilled several medications today  Exercise regularly such as 150 minutes per week of aerobic exercise  Lets plan to see you back for a physical or fasting med check within 3 months.      Gastroesophageal Reflux Disease, Adult   Gastroesophageal reflux disease (GERD) happens when acid from your stomach flows up into the esophagus. When acid comes in contact with the esophagus, the acid causes soreness (inflammation) in the esophagus. Over time, GERD may create small holes (ulcers) in the lining of the esophagus.  CAUSES   Increased body weight. This puts pressure on the stomach, making acid rise from the stomach into the esophagus.   Smoking. This increases acid production in the stomach.   Drinking alcohol. This causes decreased pressure in the lower esophageal sphincter (valve or ring of muscle between the esophagus and stomach), allowing acid from the stomach into the esophagus.   Late evening meals and a full stomach. This increases pressure and acid production in the stomach.   A malformed lower esophageal sphincter.  Sometimes, no cause is found.  SYMPTOMS   Burning pain in the lower part of the mid-chest behind the breastbone and in the mid-stomach area. This may occur twice a week or more often.   Trouble swallowing.   Sore throat.   Dry cough.   Asthma-like symptoms including chest tightness, shortness of breath, or wheezing.   DIAGNOSIS  Your caregiver may be able to diagnose GERD based on your symptoms. In some cases, X-rays and other tests may be done to check for complications or to check  the condition of your stomach and esophagus.   HOME CARE INSTRUCTIONS   Change the factors that you can control. Ask your caregiver for guidance concerning weight loss, quitting smoking, and alcohol consumption.   Avoid foods and drinks that make your symptoms worse, such as:   Caffeine or alcoholic drinks.   Chocolate.   Peppermint or mint flavorings.   Garlic and onions.   Spicy foods.   Citrus fruits, such as oranges, lemons, or limes.   Tomato-based foods such as sauce, chili, salsa, and pizza.   Fried and fatty foods.   Avoid lying down for the 3 hours prior to your bedtime or prior to taking a nap.   Eat small, frequent meals instead of large meals.   Wear loose-fitting clothing. Do not wear anything tight around your waist that causes pressure on your stomach.   Raise the head of your bed 6 to 8 inches with wood blocks to help you sleep. Extra pillows will not help.   Only take over-the-counter or prescription medicines for pain, discomfort, or fever as directed by your caregiver.   Do not take aspirin, ibuprofen, or other nonsteroidal anti-inflammatory drugs (NSAIDs).   SEEK IMMEDIATE MEDICAL CARE IF:   You have pain in your arms, neck, jaw, teeth, or back.   Your pain increases or changes in intensity or duration.   You develop nausea, vomiting, or sweating (diaphoresis).   You develop shortness of breath, or you faint.   Your vomit is green, yellow, black, or looks like coffee  grounds or blood.   Your stool is red, bloody, or black.  These symptoms could be signs of other problems, such as heart disease, gastric bleeding, or esophageal bleeding. MAKE SURE YOU:   Understand these instructions.   Will watch your condition.   Will get help right away if you are not doing well or get worse.  Document Released: 10/11/2004 Document Revised: 09/13/2010 Document Reviewed: 07/21/2010 [facility] Patient Information 2012 [place].

## 2018-03-19 ENCOUNTER — Encounter: Payer: Self-pay | Admitting: Nurse Practitioner

## 2018-03-19 ENCOUNTER — Ambulatory Visit: Payer: BLUE CROSS/BLUE SHIELD | Admitting: Nurse Practitioner

## 2018-03-19 VITALS — BP 110/72 | HR 66 | Ht 66.0 in | Wt 205.4 lb

## 2018-03-19 DIAGNOSIS — K219 Gastro-esophageal reflux disease without esophagitis: Secondary | ICD-10-CM

## 2018-03-19 MED ORDER — FAMOTIDINE 20 MG PO TABS: 20.0000 mg | ORAL_TABLET | Freq: Every day | ORAL | 5 refills | Status: DC

## 2018-03-19 NOTE — Progress Notes (Signed)
Chief Complaint:    Chest discomfort  IMPRESSION and PLAN:    74. 5 old female with longstanding GERD, on daily Dexilant.  She is here with chest pressure, sometimes wakes her up during the night.  Though cardiac etiology seems unlikely, it should be kept in mind .  Reassuring that she has no exertional chest pain.  No shortness of breath and the chest pressure is relieved with Pepcid.  -Antireflux measures discussed.  Patient will elevate head of bed or invest in a wedge pillow.  -She certainly has room to improve upon her caffeine consumption of 4 large glasses of iced tea throughout the day.  Asked her to reduce caffeine intake by at least one half.  -Weight loss if possible will improve GERD symptoms in some people -Patient will be scheduled for EGD for evaluation of chest discomfort but also she gives a remote history of Barrett's esophagus [facility]).The risks and benefits of EGD were discussed and the patient agrees to proceed.    2. Colon cancer screening.  Due for next screening colonoscopy September 2027  HPI:     Patient is a 63 year old female known to Dr. Ardis Hughs.  She has a history of mild chronic constipation and chronic GERD.  Patient was last seen December 2017 and that was for evaluation of GERD and mild constipation.. Zegerid was difficult to afford, she was started on omeprazole 40 mg in the a.m. and Zantac 150 milligrams at bedtime.  Reglan was discontinued.  She was started on fiber supplements  Patient is here with ongoing chest discomfort.  She really describes this as more of a chest pressure.  The pressure wakes her up during the night and it is relieved with Pepcid.  We had her on omeprazole but PCP recently changed that to [place] .  Patient did not realize that we wanted her to discontinue the metoclopramide when we saw her a couple of years ago.  Her PCP did discontinue it 2 weeks ago however . Shacarra does not elevate the head of her bed, she drinks  caffeine throughout the day.  Patient gives a remote history of Barrett's esophagus diagnosed at [facility].  Sounds like she had an EGD approximately 7 years ago and was told that Barrett's was not seen.  No dysphagia.  She complains of sinus drainage, takes Zyrtec.   Review of systems:      no SOB, no fevers, no urinary sx   Past Medical History:  Diagnosis Date  . Arthritis   . Barrett's esophagus with esophagitis 07/23/12   EGD: CLO test results negative. pathology: Barretss: Repeat EGD 3 years  . Depression   . Family history of anesthesia complication    mother trouble waking up  . Frequency of urination   . GERD (gastroesophageal reflux disease)    h/o peptic ulcer  . High cholesterol   . HSV-2 infection    anal  . Nocturia   . Obesity   . Overactive bladder   . Reflux   . Urgency of urination     Patient's surgical history, family medical history, social history, medications and allergies were all reviewed in Epic   Creatinine clearance cannot be calculated (Patient's most recent lab result is older than the maximum 21 days allowed.)  Current Outpatient Medications  Medication Sig Dispense Refill  . ALPRAZolam (XANAX) 0.25 MG tablet Take 1 tablet (0.25 mg total) by mouth at bedtime. 30 tablet 0  . dexlansoprazole (  DEXILANT) 60 MG capsule Take 1 capsule (60 mg total) by mouth daily. 90 capsule 0  . DULoxetine (CYMBALTA) 60 MG capsule Take 2 capsules (120 mg total) by mouth daily. 180 capsule 0  . ezetimibe (ZETIA) 10 MG tablet TAKE 1 TABLET(10 MG) BY MOUTH EVERY MORNING 90 tablet 0  . fluticasone (FLONASE) 50 MCG/ACT nasal spray SHAKE LIQUID AND USE 1 SPRAY IN EACH NOSTRIL DAILY 16 g 11  . meloxicam (MOBIC) 7.5 MG tablet Take 1 tablet (7.5 mg total) by mouth daily. 90 tablet 0  . metoCLOPramide (REGLAN) 10 MG tablet TAKE 1 TABLET BY MOUTH THREE TIMES DAILY BEFORE MEALS 90 tablet 0  . Multiple Vitamin (MULTIVITAMIN) tablet Take 1 tablet by mouth daily.    Marland Kitchen  OVER THE COUNTER MEDICATION Vitamin B12    . OVER THE COUNTER MEDICATION Vitamin d 3    . solifenacin (VESICARE) 10 MG tablet Take 1 tablet (10 mg total) by mouth daily. 90 tablet 4  . tamsulosin (FLOMAX) 0.4 MG CAPS capsule Take 0.4 mg by mouth.    . valACYclovir (VALTREX) 500 MG tablet Take 1 tablet (500 mg total) by mouth daily. 90 tablet 1   No current facility-administered medications for this visit.     Physical Exam:     BP 110/72   Pulse 66   Ht 5\' 6"  (1.676 m)   Wt 205 lb 6 oz (93.2 kg)   BMI 33.15 kg/m   GENERAL:  Pleasant female in NAD PSYCH: : Cooperative, normal affect EENT:  conjunctiva pink, mucous membranes moist, neck supple without masses CARDIAC:  RRR, murmur heard, no peripheral edema PULM: Normal respiratory effort, lungs CTA bilaterally, no wheezing ABDOMEN:  Nondistended, soft, nontender. No obvious masses, no hepatomegaly,  normal bowel sounds SKIN:  turgor, no lesions seen Musculoskeletal:  Normal muscle tone, normal strength NEURO: Alert and oriented x 3, no focal neurologic deficits   Tye Savoy , NP 03/19/2018, 11:35 AM

## 2018-03-19 NOTE — Patient Instructions (Signed)
If you are age 62 or older, your body mass index should be between 23-30. Your Body mass index is 33.15 kg/m. If this is out of the aforementioned range listed, please consider follow up with your Primary Care Provider.  If you are age 58 or younger, your body mass index should be between 19-25. Your Body mass index is 33.15 kg/m. If this is out of the aformentioned range listed, please consider follow up with your Primary Care Provider.   You have been scheduled for an endoscopy. Please follow written instructions given to you at your visit today. If you use inhalers (even only as needed), please bring them with you on the day of your procedure. Your physician has requested that you go to www.startemmi.com and enter the access code given to you at your visit today. This web site gives a general overview about your procedure. However, you should still follow specific instructions given to you by our office regarding your preparation for the procedure.  We have sent the following medications to your pharmacy for you to pick up at your convenience: Pepcid  Use a wedge pillow.  Decrease caffeine by half.  Thank you for choosing me and [facility].   Tye Savoy, NP

## 2018-03-20 NOTE — Progress Notes (Signed)
I agree with the above note, plan 

## 2018-03-21 ENCOUNTER — Encounter: Payer: Self-pay | Admitting: Gastroenterology

## 2018-03-28 ENCOUNTER — Encounter: Payer: BLUE CROSS/BLUE SHIELD | Admitting: Gastroenterology

## 2018-04-03 ENCOUNTER — Telehealth: Payer: Self-pay

## 2018-04-03 NOTE — Telephone Encounter (Signed)
Covid-19 travel screening questions  Have you traveled in the last 14 days? No. If yes where?  Do you now or have you had a fever in the last 14 days? No.  Do you have any respiratory symptoms of shortness of breath or cough now or in the last 14 days? No.  Do you have a medical history of Congestive Heart Failure?  Do you have a medical history of lung disease?  Do you have any family members or close contacts with diagnosed or suspected Covid-19? No.      Patient understands and agrees that care partner will stay in car during procedure and will be called at the time of discharge.

## 2018-04-04 ENCOUNTER — Ambulatory Visit (AMBULATORY_SURGERY_CENTER): Payer: BLUE CROSS/BLUE SHIELD | Admitting: Gastroenterology

## 2018-04-04 ENCOUNTER — Other Ambulatory Visit: Payer: Self-pay

## 2018-04-04 ENCOUNTER — Encounter: Payer: Self-pay | Admitting: Gastroenterology

## 2018-04-04 VITALS — BP 107/75 | HR 73 | Temp 96.7°F | Resp 10 | Ht 66.0 in | Wt 205.0 lb

## 2018-04-04 DIAGNOSIS — K2970 Gastritis, unspecified, without bleeding: Secondary | ICD-10-CM

## 2018-04-04 DIAGNOSIS — K2950 Unspecified chronic gastritis without bleeding: Secondary | ICD-10-CM | POA: Diagnosis not present

## 2018-04-04 DIAGNOSIS — K219 Gastro-esophageal reflux disease without esophagitis: Secondary | ICD-10-CM | POA: Diagnosis not present

## 2018-04-04 DIAGNOSIS — K2990 Gastroduodenitis, unspecified, without bleeding: Secondary | ICD-10-CM

## 2018-04-04 MED ORDER — SODIUM CHLORIDE 0.9 % IV SOLN: 500.0000 mL | Freq: Once | INTRAVENOUS | Status: DC

## 2018-04-04 NOTE — Progress Notes (Signed)
Called to room to assist during endoscopic procedure.  Patient ID and intended procedure confirmed with present staff. Received instructions for my participation in the procedure from the performing physician.  

## 2018-04-04 NOTE — Patient Instructions (Signed)
YOU HAD AN ENDOSCOPIC PROCEDURE TODAY AT [facility]:   Refer to the procedure report that was given to you for any specific questions about what was found during the examination.  If the procedure report does not answer your questions, please call your gastroenterologist to clarify.  If you requested that your care partner not be given the details of your procedure findings, then the procedure report has been included in a sealed envelope for you to review at your convenience later.  YOU SHOULD EXPECT: Some feelings of bloating in the abdomen. Passage of more gas than usual.  Walking can help get rid of the air that was put into your GI tract during the procedure and reduce the bloating. If you had a lower endoscopy (such as a colonoscopy or flexible sigmoidoscopy) you may notice spotting of blood in your stool or on the toilet paper. If you underwent a bowel prep for your procedure, you may not have a normal bowel movement for a few days.  Please Note:  You might notice some irritation and congestion in your nose or some drainage.  This is from the oxygen used during your procedure.  There is no need for concern and it should clear up in a day or so.  SYMPTOMS TO REPORT IMMEDIATELY:    Following upper endoscopy (EGD)  Vomiting of blood or coffee ground material  New chest pain or pain under the shoulder blades  Painful or persistently difficult swallowing  New shortness of breath  Fever of 100F or higher  Black, tarry-looking stools  Dr. Ardis Hughs recommends that you stay on your present medications including Dexilant and Famotidine. Please see handouts given to you on Gastritis and GERD.   For urgent or emergent issues, a gastroenterologist can be reached at any hour by calling [phone].   DIET:  We do recommend a small meal at first, but then you may proceed to your regular diet.  Drink plenty of fluids but you should avoid alcoholic beverages for 24  hours.  ACTIVITY:  You should plan to take it easy for the rest of today and you should NOT DRIVE or use heavy machinery until tomorrow (because of the sedation medicines used during the test).    FOLLOW UP: Our staff will call the number listed on your records the next business day following your procedure to check on you and address any questions or concerns that you may have regarding the information given to you following your procedure. If we do not reach you, we will leave a message.  However, if you are feeling well and you are not experiencing any problems, there is no need to return our call.  We will assume that you have returned to your regular daily activities without incident.  If any biopsies were taken you will be contacted by phone or by letter within the next 1-3 weeks.  Please call us at [phone] if you have not heard about the biopsies in 3 weeks.    SIGNATURES/CONFIDENTIALITY: You and/or your care partner have signed paperwork which will be entered into your electronic medical record.  These signatures attest to the fact that that the information above on your After Visit Summary has been reviewed and is understood.  Full responsibility of the confidentiality of this discharge information lies with you and/or your care-partner.  Thank you for letting us take care of your healthcare needs today.

## 2018-04-04 NOTE — Progress Notes (Signed)
Report given to PACU, vss 

## 2018-04-04 NOTE — Op Note (Signed)
[facility] Patient Name: Kristi Avila Procedure Date: 04/04/2018 9:45 AM MRN: [id number] Endoscopist: Milus Banister , MD Age: 63 Referring MD:  Date of Birth: 04/18/1955 Gender: Female Account #: [id number] Procedure:                Upper GI endoscopy Indications:              Heartburn Medicines:                Monitored Anesthesia Care Procedure:                Pre-Anesthesia Assessment:                           - Prior to the procedure, a History and Physical                            was performed, and patient medications and                            allergies were reviewed. The patient's tolerance of                            previous anesthesia was also reviewed. The risks                            and benefits of the procedure and the sedation                            options and risks were discussed with the patient.                            All questions were answered, and informed consent                            was obtained. Prior Anticoagulants: The patient has                            taken no previous anticoagulant or antiplatelet                            agents. ASA Grade Assessment: II - A patient with                            mild systemic disease. After reviewing the risks                            and benefits, the patient was deemed in                            satisfactory condition to undergo the procedure.                           After obtaining informed consent, the endoscope was  passed under direct vision. Throughout the                            procedure, the patient's blood pressure, pulse, and                            oxygen saturations were monitored continuously. The                            Endoscope was introduced through the mouth, and                            advanced to the second part of duodenum. The upper                            GI endoscopy was accomplished without  difficulty.                            The patient tolerated the procedure well. Scope In: Scope Out: Findings:                 Mild inflammation characterized by erythema and                            friability was found in the gastric antrum.                            Biopsies were taken with a cold forceps for                            histology.                           The exam was otherwise without abnormality. Complications:            No immediate complications. Estimated blood loss:                            None. Estimated Blood Loss:     Estimated blood loss: none. Impression:               - Gastritis. Biopsied.                           - The examination was otherwise normal. Recommendation:           - Patient has a contact number available for                            emergencies. The signs and symptoms of potential                            delayed complications were discussed with the                            patient. Return to normal activities tomorrow.  Written discharge instructions were provided to the                            patient.                           - Resume previous diet.                           - Continue present medications. Continue dexilant                            in AM and pepcid (famotidine) at bedtime.                           - Await pathology results. Milus Banister, MD 04/04/2018 9:59:38 AM This report has been signed electronically.

## 2018-04-07 ENCOUNTER — Telehealth: Payer: Self-pay | Admitting: *Deleted

## 2018-04-07 NOTE — Telephone Encounter (Signed)
  Follow up Call-  Call back number 04/04/2018 09/27/2015  Post procedure Call Back phone  # [phone]  Permission to leave phone message Yes Yes  Some recent data might be hidden     Patient questions:  Do you have a fever, pain , or abdominal swelling? No. Pain Score  0 *  Have you tolerated food without any problems? Yes.    Have you been able to return to your normal activities? Yes.    Do you have any questions about your discharge instructions: Diet   No. Medications  No. Follow up visit  No.  Do you have questions or concerns about your Care? No.  Actions: * If pain score is 4 or above: No action needed, pain <4.

## 2018-04-09 ENCOUNTER — Encounter: Payer: Self-pay | Admitting: Gastroenterology

## 2018-06-14 ENCOUNTER — Other Ambulatory Visit: Payer: Self-pay | Admitting: Medical

## 2018-07-02 ENCOUNTER — Other Ambulatory Visit: Payer: Self-pay | Admitting: Medical

## 2018-07-06 ENCOUNTER — Other Ambulatory Visit: Payer: Self-pay | Admitting: Medical

## 2018-07-07 NOTE — Telephone Encounter (Signed)
Is this ok to refill?  appt 07-25-18

## 2018-07-25 ENCOUNTER — Encounter: Payer: BLUE CROSS/BLUE SHIELD | Admitting: Medical

## 2018-07-30 DIAGNOSIS — C44722 Squamous cell carcinoma of skin of right lower limb, including hip: Secondary | ICD-10-CM | POA: Diagnosis not present

## 2018-07-30 DIAGNOSIS — L570 Actinic keratosis: Secondary | ICD-10-CM | POA: Diagnosis not present

## 2018-08-07 ENCOUNTER — Other Ambulatory Visit: Payer: Self-pay | Admitting: Medical

## 2018-08-07 ENCOUNTER — Telehealth: Payer: Self-pay | Admitting: Family Medicine

## 2018-08-07 DIAGNOSIS — K219 Gastro-esophageal reflux disease without esophagitis: Secondary | ICD-10-CM

## 2018-08-07 DIAGNOSIS — Z0000 Encounter for general adult medical examination without abnormal findings: Secondary | ICD-10-CM

## 2018-08-07 DIAGNOSIS — E669 Obesity, unspecified: Secondary | ICD-10-CM

## 2018-08-07 DIAGNOSIS — Z1159 Encounter for screening for other viral diseases: Secondary | ICD-10-CM

## 2018-08-07 DIAGNOSIS — K22719 Barrett's esophagus with dysplasia, unspecified: Secondary | ICD-10-CM

## 2018-08-07 DIAGNOSIS — E785 Hyperlipidemia, unspecified: Secondary | ICD-10-CM

## 2018-08-07 NOTE — Telephone Encounter (Signed)
Pt called, she is off work tomorrow and would like to come in an complete her fasting labs for her upcoming cpe. Please put in labs for tomorrow morning if ok.

## 2018-08-07 NOTE — Telephone Encounter (Signed)
Orders are in

## 2018-08-08 ENCOUNTER — Other Ambulatory Visit: Payer: BC Managed Care – PPO

## 2018-08-08 ENCOUNTER — Other Ambulatory Visit: Payer: Self-pay

## 2018-08-08 ENCOUNTER — Telehealth: Payer: Self-pay

## 2018-08-08 DIAGNOSIS — E669 Obesity, unspecified: Secondary | ICD-10-CM | POA: Diagnosis not present

## 2018-08-08 DIAGNOSIS — E785 Hyperlipidemia, unspecified: Secondary | ICD-10-CM

## 2018-08-08 DIAGNOSIS — Z1159 Encounter for screening for other viral diseases: Secondary | ICD-10-CM

## 2018-08-08 DIAGNOSIS — Z0000 Encounter for general adult medical examination without abnormal findings: Secondary | ICD-10-CM | POA: Diagnosis not present

## 2018-08-08 NOTE — Telephone Encounter (Signed)
Patient came in for lab visit for CPE that is coming up and after Verdis Frederickson drew the blood she asked if she could have antibody test for Covid-19?   I told her I would have to check with you and that you were not here today and would not be back until Monday.   She wanted to know if she could go to a testing site to get antibody test if you put in orders?   Please advise.  Thank you

## 2018-08-09 LAB — LIPID PANEL
Chol/HDL Ratio: 3 ratio (ref 0.0–4.4)
Cholesterol, Total: 194 mg/dL (ref 100–199)
HDL: 65 mg/dL (ref >39)
LDL Calculated: 113 mg/dL — ABNORMAL HIGH (ref 0–99)
Triglycerides: 81 mg/dL (ref 0–149)
VLDL Cholesterol Cal: 16 mg/dL (ref 5–40)

## 2018-08-09 LAB — COMPREHENSIVE METABOLIC PANEL
ALT: 16 IU/L (ref 0–32)
AST: 16 IU/L (ref 0–40)
Albumin/Globulin Ratio: 2.1 (ref 1.2–2.2)
Albumin: 4.5 g/dL (ref 3.8–4.8)
Alkaline Phosphatase: 83 IU/L (ref 39–117)
BUN/Creatinine Ratio: 17 (ref 12–28)
BUN: 16 mg/dL (ref 8–27)
Bilirubin Total: 0.4 mg/dL (ref 0.0–1.2)
CO2: 23 mmol/L (ref 20–29)
Calcium: 9.5 mg/dL (ref 8.7–10.3)
Chloride: 104 mmol/L (ref 96–106)
Creatinine, Ser: 0.95 mg/dL (ref 0.57–1.00)
GFR calc Af Amer: 75 mL/min/{1.73_m2} (ref >59)
GFR calc non Af Amer: 65 mL/min/{1.73_m2} (ref >59)
Globulin, Total: 2.1 g/dL (ref 1.5–4.5)
Glucose: 102 mg/dL — ABNORMAL HIGH (ref 65–99)
Potassium: 4.7 mmol/L (ref 3.5–5.2)
Sodium: 140 mmol/L (ref 134–144)
Total Protein: 6.6 g/dL (ref 6.0–8.5)

## 2018-08-09 LAB — CBC WITH DIFFERENTIAL/PLATELET
Basophils Absolute: 0.1 10*3/uL (ref 0.0–0.2)
Basos: 2 %
EOS (ABSOLUTE): 0.1 10*3/uL (ref 0.0–0.4)
Eos: 2 %
Hematocrit: 40.8 % (ref 34.0–46.6)
Hemoglobin: 13.8 g/dL (ref 11.1–15.9)
Immature Grans (Abs): 0 10*3/uL (ref 0.0–0.1)
Immature Granulocytes: 0 %
Lymphocytes Absolute: 1.9 10*3/uL (ref 0.7–3.1)
Lymphs: 33 %
MCH: 30.4 pg (ref 26.6–33.0)
MCHC: 33.8 g/dL (ref 31.5–35.7)
MCV: 90 fL (ref 79–97)
Monocytes Absolute: 0.5 10*3/uL (ref 0.1–0.9)
Monocytes: 8 %
Neutrophils Absolute: 3.2 10*3/uL (ref 1.4–7.0)
Neutrophils: 55 %
Platelets: 363 10*3/uL (ref 150–450)
RBC: 4.54 x10E6/uL (ref 3.77–5.28)
RDW: 12 % (ref 11.7–15.4)
WBC: 5.7 10*3/uL (ref 3.4–10.8)

## 2018-08-09 LAB — HEMOGLOBIN A1C
Est. average glucose Bld gHb Est-mCnc: 105 mg/dL
Hgb A1c MFr Bld: 5.3 % (ref 4.8–5.6)

## 2018-08-09 LAB — HIV ANTIBODY (ROUTINE TESTING W REFLEX): HIV Screen 4th Generation wRfx: NONREACTIVE

## 2018-08-09 LAB — HEPATITIS C ANTIBODY: Hep C Virus Ab: <0.1 s/co ratio (ref 0.0–0.9)

## 2018-08-10 DIAGNOSIS — Z20828 Contact with and (suspected) exposure to other viral communicable diseases: Secondary | ICD-10-CM | POA: Diagnosis not present

## 2018-08-18 ENCOUNTER — Ambulatory Visit (INDEPENDENT_AMBULATORY_CARE_PROVIDER_SITE_OTHER): Payer: BC Managed Care – PPO | Admitting: Medical

## 2018-08-18 ENCOUNTER — Other Ambulatory Visit: Payer: Self-pay

## 2018-08-18 ENCOUNTER — Encounter: Payer: Self-pay | Admitting: Medical

## 2018-08-18 VITALS — Temp 97.3°F

## 2018-08-18 DIAGNOSIS — F419 Anxiety disorder, unspecified: Secondary | ICD-10-CM

## 2018-08-18 DIAGNOSIS — R0982 Postnasal drip: Secondary | ICD-10-CM | POA: Diagnosis not present

## 2018-08-18 DIAGNOSIS — F329 Major depressive disorder, single episode, unspecified: Secondary | ICD-10-CM

## 2018-08-18 DIAGNOSIS — R0789 Other chest pain: Secondary | ICD-10-CM | POA: Diagnosis not present

## 2018-08-18 DIAGNOSIS — J029 Acute pharyngitis, unspecified: Secondary | ICD-10-CM | POA: Diagnosis not present

## 2018-08-18 DIAGNOSIS — Z636 Dependent relative needing care at home: Secondary | ICD-10-CM | POA: Insufficient documentation

## 2018-08-18 DIAGNOSIS — F32A Depression, unspecified: Secondary | ICD-10-CM

## 2018-08-18 MED ORDER — ALPRAZOLAM 0.25 MG PO TABS: 0.2500 mg | ORAL_TABLET | Freq: Every day | ORAL | 0 refills | Status: DC

## 2018-08-18 MED ORDER — AMOXICILLIN 875 MG PO TABS: 875.0000 mg | ORAL_TABLET | Freq: Two times a day (BID) | ORAL | 0 refills | Status: DC

## 2018-08-18 NOTE — Progress Notes (Signed)
Subjective:     Patient ID: Kristi Avila, female   DOB: 1955-09-28, 63 y.o.   MRN: [id number]  This visit type was conducted due to national recommendations for restrictions regarding the COVID-19 Pandemic (e.g. social distancing) in an effort to limit this patient's exposure and mitigate transmission in our community.   This format is felt to be most appropriate for this patient at this time.    Documentation for virtual audio and video telecommunications through Zoom encounter:  The patient was located at home. The provider was located in the office. The patient did consent to this visit and is aware of possible charges through their insurance for this visit.  The other persons participating in this telemedicine service were none. Time spent on call was 15 minutes and in review of previous records >15 minutes total.  This virtual service is not related to other E/M service within previous 7 days.   HPI Chief Complaint  Patient presents with  . st    st X 2 months, SOB X july 4   covid test negative today   Virtual consult today for symptoms.  She notes ongoing sore throat for the past 1+ months including postnasal drainage, congestion, some shortness of breath and a feeling of heaviness in the chest.  She notes history of sinus infection a few times every year and has had bronchitis in the past a few times per year.  Not sure if this is sinuses or bronchitis but she does have ongoing postnasal drainage and sore throat.  She notes some shallow breathing, feels tired, throat is raspy, but denies fever, no headache, no change in taste or smell, no ear pain.  No nausea vomiting or diarrhea.  No swelling in the legs no dyspnea on exertion.  She is a lifetime non-smoker.  No history of heart failure.  No reported GERD.  She feels like symptoms are worse because she has to wear a mask at work, works in a medical office.  No sick contacts.  She did go and have a COVID test last week which came  back yesterday and was negative.  No family history of heart disease.  She drinks about 5 beers on Saturday no alcohol during the week.  She would like a refill on her Xanax.  She feels under a lot more stress of late.  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  She is due back soon for physical here.  She came in recently for lab draw.  Past Medical History:  Diagnosis Date  . Arthritis   . Barrett's esophagus with esophagitis 07/23/12   EGD: CLO test results negative. pathology: [facility]: Repeat EGD 3 years  . Blood transfusion without reported diagnosis   . Depression   . Family history of anesthesia complication    mother trouble waking up  . Frequency of urination   . GERD (gastroesophageal reflux disease)    h/o peptic ulcer  . High cholesterol   . HSV-2 infection    anal  . Nocturia   . Obesity   . Overactive bladder   . Reflux   . Urgency of urination    Current Outpatient Medications on File Prior to Visit  Medication Sig Dispense Refill  . DEXILANT 60 MG capsule TAKE 1 CAPSULE(60 MG) BY MOUTH DAILY 90 capsule 0  . DULoxetine (CYMBALTA) 60 MG capsule TAKE 2 CAPSULE BY MOUTH EVERY DAY.  180 capsule 0  . ezetimibe (ZETIA) 10 MG tablet TAKE 1 TABLET(10 MG) BY MOUTH EVERY MORNING 90 tablet 0  . famotidine (PEPCID) 20 MG tablet Take 1 tablet (20 mg total) by mouth at bedtime. 30 tablet 5  . fluticasone (FLONASE) 50 MCG/ACT nasal spray SHAKE LIQUID AND USE 1 SPRAY IN EACH NOSTRIL DAILY 16 g 11  . meloxicam (MOBIC) 7.5 MG tablet Take 1 tablet (7.5 mg total) by mouth daily. 90 tablet 0  . Multiple Vitamin (MULTIVITAMIN) tablet Take 1 tablet by mouth daily.    Marland Kitchen OVER THE COUNTER MEDICATION Vitamin B12    . OVER THE COUNTER MEDICATION Vitamin d 3    . solifenacin (VESICARE) 10 MG tablet Take 1 tablet (10 mg total) by mouth daily. 90 tablet 4  . tamsulosin (FLOMAX) 0.4 MG CAPS capsule Take 0.4 mg by mouth.     . valACYclovir (VALTREX) 500 MG tablet Take 1 tablet (500 mg total) by mouth daily. 90 tablet 1   No current facility-administered medications on file prior to visit.      Review of Systems As in subjective    Objective:   Physical Exam  Temp (!) 97.3 F (36.3 C) (Oral)   Due to coronavirus pandemic stay at home measures, patient visit was virtual and they were not examined in person.   Gen: wd, wn, nad      Assessment:     Encounter Diagnoses  Name Primary?  . Chest tightness Yes  . Post-nasal drainage   . Sore throat   . Anxiety and depression   . Caregiver burden        Plan:     Sore throat, chest tightness, postnasal drip-begin amoxicillin, increase water intake, can use salt water gargles, nasal saline flush, Mucinex DM over-the-counter.  If not seeing improvements over the next 4 to 5 days and call back.  We discussed the limitations of virtual consult.  Anxiety depression- refilled Xanax, discussed coping skills and ways to deal with anxiety.     F/u soon for physical as planned.    Kristi Avila was seen today for st.  Diagnoses and all orders for this visit:  Chest tightness -     DG Chest 2 View; Future  Post-nasal drainage  Sore throat  Anxiety and depression  Caregiver burden  Other orders -     amoxicillin (AMOXIL) 875 MG tablet; Take 1 tablet (875 mg total) by mouth 2 (two) times daily. -     ALPRAZolam (XANAX) 0.25 MG tablet; Take 1 tablet (0.25 mg total) by mouth at bedtime.

## 2018-08-19 ENCOUNTER — Ambulatory Visit
Admission: RE | Admit: 2018-08-19 | Discharge: 2018-08-19 | Disposition: A | Discharge status: Home / Self Care | Payer: BC Managed Care – PPO | Source: Ambulatory Visit | Attending: Medical | Admitting: Medical

## 2018-08-19 DIAGNOSIS — R0602 Shortness of breath: Secondary | ICD-10-CM | POA: Diagnosis not present

## 2018-08-19 DIAGNOSIS — R0789 Other chest pain: Secondary | ICD-10-CM

## 2018-08-22 ENCOUNTER — Encounter: Payer: BLUE CROSS/BLUE SHIELD | Admitting: Medical

## 2018-08-30 ENCOUNTER — Other Ambulatory Visit: Payer: Self-pay | Admitting: Medical

## 2018-09-08 ENCOUNTER — Encounter: Payer: Self-pay | Admitting: Medical

## 2018-09-08 ENCOUNTER — Ambulatory Visit (INDEPENDENT_AMBULATORY_CARE_PROVIDER_SITE_OTHER): Payer: BC Managed Care – PPO | Admitting: Medical

## 2018-09-08 ENCOUNTER — Other Ambulatory Visit: Payer: Self-pay

## 2018-09-08 VITALS — BP 134/80 | HR 68 | Temp 97.9°F | Resp 16 | Ht 66.0 in | Wt 207.4 lb

## 2018-09-08 DIAGNOSIS — Z8262 Family history of osteoporosis: Secondary | ICD-10-CM

## 2018-09-08 DIAGNOSIS — E2839 Other primary ovarian failure: Secondary | ICD-10-CM

## 2018-09-08 DIAGNOSIS — Z7185 Encounter for immunization safety counseling: Secondary | ICD-10-CM | POA: Insufficient documentation

## 2018-09-08 DIAGNOSIS — Z7189 Other specified counseling: Secondary | ICD-10-CM | POA: Diagnosis not present

## 2018-09-08 DIAGNOSIS — Z0000 Encounter for general adult medical examination without abnormal findings: Secondary | ICD-10-CM | POA: Diagnosis not present

## 2018-09-08 DIAGNOSIS — Z23 Encounter for immunization: Secondary | ICD-10-CM | POA: Diagnosis not present

## 2018-09-08 DIAGNOSIS — E785 Hyperlipidemia, unspecified: Secondary | ICD-10-CM

## 2018-09-08 DIAGNOSIS — Z780 Asymptomatic menopausal state: Secondary | ICD-10-CM

## 2018-09-08 NOTE — Patient Instructions (Addendum)
Thanks for trusting [place] with your health care and for coming in for a physical today.  Below are some general recommendations I have for you:  Yearly screenings See your eye doctor yearly for routine vision care. See your dentist yearly for routine dental care including hygiene visits twice yearly. See your gynecologist yearly for routine gynecological care. See me here yearly for a routine physical and preventative care visit   Cancer screening Colon cancer screening:   You are up to date on colonoscopy screening.      Osteoporosis screening/Bone Density test - I recommend a bone density test if you are over 67 years old, or under 61 years old if you have risk factors such as a bone fracture as an adult, parent with history of bone fracture as adult, thyroid disease, early menopause, or underweight for example.  Please call to schedule your bone density test.   [facility]  [id number], [place], [place] [address]    Specific Concerns today:  . Shingles vaccine:  I recommend you have a shingles vaccine to help prevent shingles or herpes zoster outbreak.   Please call your insurer to inquire about coverage for the Shingrix vaccine given in 2 doses.   Some insurers cover this vaccine after age 85, some cover this after age 27.  If your insurer covers this, then call to schedule appointment to have this vaccine here. . We updated your flu shot today   Please follow up yearly for a physical.    I have included other useful information below for your review.  Preventative Care for Adults - Female      MAINTAIN REGULAR HEALTH EXAMS:  A routine yearly physical is a good way to check in with your primary care provider about your health and preventive screening. It is also an opportunity to share updates about your health and any concerns you have, and receive a thorough all-over exam.   Most health insurance companies pay for  at least some preventative services.  Check with your health plan for specific coverages.  WHAT PREVENTATIVE SERVICES DO WOMEN NEED?  Adult women should have their weight and blood pressure checked regularly.   Women age 56 and older should have their cholesterol levels checked regularly.  Women should be screened for cervical cancer with a Pap smear and pelvic exam beginning at either age 82, or 3 years after they become sexually activity.    Breast cancer screening generally begins at age 94 with a mammogram and breast exam by your primary care provider.    Beginning at age 49 and continuing to age 1, women should be screened for colorectal cancer.  Certain people may need continued testing until age 61.  Updating vaccinations is part of preventative care.  Vaccinations help protect against diseases such as the flu.  Osteoporosis is a disease in which the bones lose minerals and strength as we age. Women ages 52 and over should discuss this with their caregivers, as should women after menopause who have other risk factors.  Lab tests are generally done as part of preventative care to screen for anemia and blood disorders, to screen for problems with the kidneys and liver, to screen for bladder problems, to check blood sugar, and to check your cholesterol level.  Preventative services generally include counseling about diet, exercise, avoiding tobacco, drugs, excessive alcohol consumption, and sexually transmitted infections.    GENERAL RECOMMENDATIONS FOR GOOD HEALTH:  Healthy  diet:  Eat a variety of foods, including fruit, vegetables, animal or vegetable protein, such as meat, fish, chicken, and eggs, or beans, lentils, tofu, and grains, such as rice.  Drink plenty of water daily.  Decrease saturated fat in the diet, avoid lots of red meat, processed foods, sweets, fast foods, and fried foods.  Exercise:  Aerobic exercise helps maintain good heart health. At least 30-40 minutes  of moderate-intensity exercise is recommended. For example, a brisk walk that increases your heart rate and breathing. This should be done on most days of the week.   Find a type of exercise or a variety of exercises that you enjoy so that it becomes a part of your daily life.  Examples are running, walking, swimming, water aerobics, and biking.  For motivation and support, explore group exercise such as aerobic class, spin class, Zumba, Yoga,or  martial arts, etc.    Set exercise goals for yourself, such as a certain weight goal, walk or run in a race such as a 5k walk/run.  Speak to your primary care provider about exercise goals.  Disease prevention:  If you smoke or chew tobacco, find out from your caregiver how to quit. It can literally save your life, no matter how long you have been a tobacco user. If you do not use tobacco, never begin.   Maintain a healthy diet and normal weight. Increased weight leads to problems with blood pressure and diabetes.   The Body Mass Index or BMI is a way of measuring how much of your body is fat. Having a BMI above 27 increases the risk of heart disease, diabetes, hypertension, stroke and other problems related to obesity. Your caregiver can help determine your BMI and based on it develop an exercise and dietary program to help you achieve or maintain this important measurement at a healthful level.  High blood pressure causes heart and blood vessel problems.  Persistent high blood pressure should be treated with medicine if weight loss and exercise do not work.   Fat and cholesterol leaves deposits in your arteries that can block them. This causes heart disease and vessel disease elsewhere in your body.  If your cholesterol is found to be high, or if you have heart disease or certain other medical conditions, then you may need to have your cholesterol monitored frequently and be treated with medication.   Ask if you should have a cardiac stress test if your  history suggests this. A stress test is a test done on a treadmill that looks for heart disease. This test can find disease prior to there being a problem.  Menopause can be associated with physical symptoms and risks. Hormone replacement therapy is available to decrease these. You should talk to your caregiver about whether starting or continuing to take hormones is right for you.   Osteoporosis is a disease in which the bones lose minerals and strength as we age. This can result in serious bone fractures. Risk of osteoporosis can be identified using a bone density scan. Women ages 44 and over should discuss this with their caregivers, as should women after menopause who have other risk factors. Ask your caregiver whether you should be taking a calcium supplement and Vitamin D, to reduce the rate of osteoporosis.   Avoid drinking alcohol in excess (more than two drinks per day).  Avoid use of street drugs. Do not share needles with anyone. Ask for professional help if you need assistance or instructions on stopping the  use of alcohol, cigarettes, and/or drugs.  Brush your teeth twice a day with fluoride toothpaste, and floss once a day. Good oral hygiene prevents tooth decay and gum disease. The problems can be painful, unattractive, and can cause other health problems. Visit your dentist for a routine oral and dental check up and preventive care every 6-12 months.   Look at your skin regularly.  Use a mirror to look at your back. Notify your caregivers of changes in moles, especially if there are changes in shapes, colors, a size larger than a pencil eraser, an irregular border, or development of new moles.  Safety:  Use seatbelts 100% of the time, whether driving or as a passenger.  Use safety devices such as hearing protection if you work in environments with loud noise or significant background noise.  Use safety glasses when doing any work that could send debris in to the eyes.  Use a helmet if  you ride a bike or motorcycle.  Use appropriate safety gear for contact sports.  Talk to your caregiver about gun safety.  Use sunscreen with a SPF (or skin protection factor) of 15 or greater.  Lighter skinned people are at a greater risk of skin cancer. Don't forget to also wear sunglasses in order to protect your eyes from too much damaging sunlight. Damaging sunlight can accelerate cataract formation.   Practice safe sex. Use condoms. Condoms are used for birth control and to help reduce the spread of sexually transmitted infections (or STIs).  Some of the STIs are gonorrhea (the clap), chlamydia, syphilis, trichomonas, herpes, HPV (human papilloma virus) and HIV (human immunodeficiency virus) which causes AIDS. The herpes, HIV and HPV are viral illnesses that have no cure. These can result in disability, cancer and death.   Keep carbon monoxide and smoke detectors in your home functioning at all times. Change the batteries every 6 months or use a model that plugs into the wall.   Vaccinations:  Stay up to date with your tetanus shots and other required immunizations. You should have a booster for tetanus every 10 years. Be sure to get your flu shot every year, since 5%-20% of the [place] population comes down with the flu. The flu vaccine changes each year, so being vaccinated once is not enough. Get your shot in the fall, before the flu season peaks.   Other vaccines to consider:  Human Papilloma Virus or HPV causes cancer of the cervix, and other infections that can be transmitted from person to person. There is a vaccine for HPV, and females should get immunized between the ages of 41 and 61. It requires a series of 3 shots.   Pneumococcal vaccine to protect against certain types of pneumonia.  This is normally recommended for adults age 25 or older.  However, adults younger than 62 years old with certain underlying conditions such as diabetes, heart or lung disease should also receive the  vaccine.  Shingles vaccine to protect against Varicella Zoster if you are older than age 94, or younger than 62 years old with certain underlying illness.  If you have not had the Shingrix vaccine, please call your insurer to inquire about coverage for the Shingrix vaccine given in 2 doses.   Some insurers cover this vaccine after age 27, some cover this after age 42.  If your insurer covers this, then call to schedule appointment to have this vaccine here  Hepatitis A vaccine to protect against a form of infection of the liver by  a virus acquired from food.  Hepatitis B vaccine to protect against a form of infection of the liver by a virus acquired from blood or body fluids, particularly if you work in health care.  If you plan to travel internationally, check with your local health department for specific vaccination recommendations.  Cancer Screening:  Breast cancer screening is essential to preventive care for women. All women age 8 and older should perform a breast self-exam every month. At age 73 and older, women should have their caregiver complete a breast exam each year. Women at ages 78 and older should have a mammogram (x-ray film) of the breasts. Your caregiver can discuss how often you need mammograms.    Cervical cancer screening includes taking a Pap smear (sample of cells examined under a microscope) from the cervix (end of the uterus). It also includes testing for HPV (Human Papilloma Virus, which can cause cervical cancer). Screening and a pelvic exam should begin at age 84, or 3 years after a woman becomes sexually active. Screening should occur every year, with a Pap smear but no HPV testing, up to age 44. After age 63, you should have a Pap smear every 3 years with HPV testing, if no HPV was found previously.   Most routine colon cancer screening begins at the age of 6. On a yearly basis, doctors may provide special easy to use take-home tests to check for hidden blood in the  stool. Sigmoidoscopy or colonoscopy can detect the earliest forms of colon cancer and is life saving. These tests use a small camera at the end of a tube to directly examine the colon. Speak to your caregiver about this at age 14, when routine screening begins (and is repeated every 5 years unless early forms of pre-cancerous polyps or small growths are found).

## 2018-09-08 NOTE — Progress Notes (Signed)
Subjective: Chief Complaint  Patient presents with  . CPE    CPE  patient has gyn   Medical team: Dr. Brien Few, [facility] Dr. Owens Loffler, GI Dr. Wilhemina Bonito, dermatology [facility]  Dr. Gerlene Burdock, dentist Dr. Bjorn Loser, urology Dr. Hector Shade, ortho , Camelia Eng, PA-C here for primary care  Concerns: Takes Valtrex daily  Struggles with depression.   Has new appt with Dr. Toy Care next month.   Dealing with loneliness, depressed mood.  Struggles getting through weekend.  Father died not too long ago, mother is aging.   Stressed with covid issues.   Faith gets her through.   Works at [employer], front desk, Press photographer.   Lives alone.  Has no children, has 5 miscarriages.      Past Medical History:  Diagnosis Date  . Arthritis   . Barrett's esophagus with esophagitis 07/23/12   EGD: CLO test results negative. pathology: [place]: Repeat EGD 3 years  . Blood transfusion without reported diagnosis   . Depression   . Family history of anesthesia complication    mother trouble waking up  . Frequency of urination   . GERD (gastroesophageal reflux disease)    h/o peptic ulcer  . High cholesterol   . HSV-2 infection    anal  . Nocturia   . Obesity   . Overactive bladder   . Reflux   . Urgency of urination     Past Surgical History:  Procedure Laterality Date  . ARTHROSCOPY KNEE W/ DRILLING     X4 ON RT KNEE  . CARPAL TUNNEL RELEASE Right 2000  . colonscopy  2007, 2017  . ESOPHAGOGASTRODUODENOSCOPY  2007   Dr. Ferdinand Lango, [place]  . foot fibroma surgery  all before 1985   x 3  . HEMORRHOID SURGERY  04/2006  . HIP FRACTURE SURGERY Right   . KNEE ARTHROSCOPY     X2 LEFT KNEE  . SINUS EXPLORATION  1995  . TOE AMPUTATION  1959  . TOTAL KNEE ARTHROPLASTY Right 09/22/2012   Procedure: RIGHT TOTAL KNEE ARTHROPLASTY;  Surgeon: [name], MD;  Location: [facility];  Service: Orthopedics;  Laterality: Right;  . TOTAL KNEE ARTHROPLASTY Left  09/28/2013   Procedure: LEFT TOTAL KNEE ARTHROPLASTY, CORTISONE INJECTION RIGHT HIP;  Surgeon: [name], MD;  Location: [facility];  Service: Orthopedics;  Laterality: Left;    Social History   Socioeconomic History  . Marital status: Divorced    Spouse name: Not on file  . Number of children: Not on file  . Years of education: Not on file  . Highest education level: Not on file  Occupational History  . Not on file  Social Needs  . Financial resource strain: Not on file  . Food insecurity    Worry: Not on file    Inability: Not on file  . Transportation needs    Medical: Not on file    Non-medical: Not on file  Tobacco Use  . Smoking status: Never Smoker  . Smokeless tobacco: Never Used  Substance and Sexual Activity  . Alcohol use: Yes    Alcohol/week: 6.0 standard drinks    Types: 6 Cans of beer per week    Comment: 6  . Drug use: No  . Sexual activity: Not on file  Lifestyle  . Physical activity    Days per week: Not on file    Minutes per session: Not on file  . Stress: Not on file  Relationships  . Social Herbalist on phone: Not on file    Gets together: Not on file    Attends religious service: Not on file    Active member of club or organization: Not on file    Attends meetings of clubs or organizations: Not on file    Relationship status: Not on file  . Intimate partner violence    Fear of current or ex partner: Not on file    Emotionally abused: Not on file    Physically abused: Not on file    Forced sexual activity: Not on file  Other Topics Concern  . Not on file  Social History Narrative   Lives alone, works at dental office in front office, exercise some.   08/2018    Family History  Problem Relation Age of Onset  . Arthritis Mother   . Pulmonary fibrosis Father        pulmonary fibrosis  . Pulmonary disease Father   . Cancer Sister        pancreatic  . Diabetes Brother   . Colon cancer Neg Hx   . Esophageal cancer Neg Hx   .  Stomach cancer Neg Hx      Current Outpatient Medications:  .  ALPRAZolam (XANAX) 0.25 MG tablet, Take 1 tablet (0.25 mg total) by mouth at bedtime., Disp: 30 tablet, Rfl: 0 .  DEXILANT 60 MG capsule, TAKE 1 CAPSULE(60 MG) BY MOUTH DAILY, Disp: 90 capsule, Rfl: 0 .  DULoxetine (CYMBALTA) 60 MG capsule, TAKE 2 CAPSULE BY MOUTH EVERY DAY., Disp: 180 capsule, Rfl: 0 .  ezetimibe (ZETIA) 10 MG tablet, TAKE 1 TABLET(10 MG) BY MOUTH EVERY MORNING, Disp: 90 tablet, Rfl: 0 .  famotidine (PEPCID) 20 MG tablet, Take 1 tablet (20 mg total) by mouth at bedtime., Disp: 30 tablet, Rfl: 5 .  fluticasone (FLONASE) 50 MCG/ACT nasal spray, SHAKE LIQUID AND USE 1 SPRAY IN EACH NOSTRIL DAILY, Disp: 16 g, Rfl: 11 .  meloxicam (MOBIC) 7.5 MG tablet, Take 1 tablet (7.5 mg total) by mouth daily., Disp: 90 tablet, Rfl: 0 .  Multiple Vitamin (MULTIVITAMIN) tablet, Take 1 tablet by mouth daily., Disp: , Rfl:  .  OVER THE COUNTER MEDICATION, Vitamin B12, Disp: , Rfl:  .  OVER THE COUNTER MEDICATION, Vitamin d 3, Disp: , Rfl:  .  solifenacin (VESICARE) 10 MG tablet, Take 1 tablet (10 mg total) by mouth daily., Disp: 90 tablet, Rfl: 4 .  tamsulosin (FLOMAX) 0.4 MG CAPS capsule, Take 0.4 mg by mouth., Disp: , Rfl:  .  valACYclovir (VALTREX) 500 MG tablet, Take 1 tablet (500 mg total) by mouth daily., Disp: 90 tablet, Rfl: 1  Allergies  Allergen Reactions  . Cephalosporins Diarrhea  . Codeine Itching  . Latex Itching  . Pravastatin     Myalgias  . Simvastatin     myalgias  . Sulfa Antibiotics Diarrhea     Reviewed their medical, surgical, family, social, medication, and allergy history and updated chart as appropriate.   Review of Systems Constitutional: -fever, -chills, -sweats, -unexpected weight change, -decreased appetite, -fatigue Allergy: -sneezing, -itching, -congestion Dermatology: -changing moles, --rash, -lumps ENT: -runny nose, -ear pain, -sore throat, -hoarseness, -sinus pain, -teeth pain, -  ringing in ears, -hearing loss, -nosebleeds Cardiology: -chest pain, -palpitations, -swelling, -difficulty breathing when lying flat, -waking up short of breath Respiratory: -cough, -shortness of breath, -difficulty breathing with exercise or exertion, -wheezing, -coughing up blood Gastroenterology: -abdominal pain, -nausea, -vomiting, -diarrhea, -constipation, -  blood in stool, -changes in bowel movement, -difficulty swallowing or eating Hematology: -bleeding, -bruising  Musculoskeletal: -joint aches, -muscle aches, -joint swelling, -back pain, -neck pain, -cramping, -changes in gait Ophthalmology: denies vision changes, eye redness, itching, discharge Urology: -burning with urination, -difficulty urinating, -blood in urine, -urinary frequency, -urgency, -incontinence Neurology: -headache, -weakness, -tingling, -numbness, -memory loss, -falls, -dizziness Psychology: +depressed mood, -agitation, -sleep problems Breast/gyn: -breast tenderness, -discharge, -lumps, -vaginal discharge,- irregular periods, -heavy periods     Objective:  BP 134/80   Pulse 68   Temp 97.9 F (36.6 C) (Oral)   Resp 16   Ht 5\' 6"  (1.676 m)   Wt 207 lb 6.4 oz (94.1 kg)   SpO2 95%   BMI 33.48 kg/m   General appearance: alert, no distress, WD/WN, Caucasian female Skin: scattered macules, no worrisome lesions HEENT: normocephalic, conjunctiva/corneas normal, sclerae anicteric, PERRLA, EOMi, nares patent, no discharge or erythema, pharynx normal Oral cavity: MMM, tongue normal, teeth normal Neck: supple, no lymphadenopathy, no thyromegaly, no masses, normal ROM, no bruits Chest: non tender, normal shape and expansion Heart: RRR, normal S1, S2, no murmurs Lungs: CTA bilaterally, no wheezes, rhonchi, or rales Abdomen: +bs, soft, non tender, non distended, no masses, no hepatomegaly, no splenomegaly, no bruits Back: non tender, normal ROM, no scoliosis Musculoskeletal: +bilat knee surgical scars, upper extremities  non tender, no obvious deformity, normal ROM throughout, lower extremities non tender, no obvious deformity, normal ROM throughout Extremities: no edema, no cyanosis, no clubbing Pulses: 2+ symmetric, upper and lower extremities, normal cap refill Neurological: alert, oriented x 3, CN2-12 intact, strength normal upper extremities and lower extremities, sensation normal throughout, DTRs 2+ throughout, no cerebellar signs, gait normal Psychiatric: normal affect, behavior normal, pleasant  Breast/gyn/rectal - deferred to gynecology  Assessment and Plan :   Encounter Diagnoses  Name Primary?  . Encounter for health maintenance examination in adult Yes  . Need for influenza vaccination   . Vaccine counseling   . Post-menopausal   . Estrogen deficiency   . Dyslipidemia   . Family history of osteoporosis     Physical exam - discussed and counseled on healthy lifestyle, diet, exercise, preventative care, vaccinations, sick and well care, proper use of emergency dept and after hours care, and addressed their concerns.    Health screening: Advised they see their eye doctor yearly for routine vision care. Advised they see their dentist yearly for routine dental care including hygiene visits twice yearly.  Bone density - recommended bone density evaluation due to risk factors postmenopausal estrogen deficiency and hereditary factors  Cancer screening Counseled on self breast exams, mammograms, cervical cancer screening  Colonoscopy:  Reviewed colonoscopy on file that is up to date  Vaccinations: Advised yearly influenza vaccine Counseled on the influenza virus vaccine.  Vaccine information sheet given.  Influenza vaccine given after consent obtained.  Shingles vaccine:  I recommend you have a shingles vaccine to help prevent shingles or herpes zoster outbreak.   Please call your insurer to inquire about coverage for the Shingrix vaccine given in 2 doses.   Some insurers cover this vaccine  after age 33, some cover this after age 72.  If your insurer covers this, then call to schedule appointment to have this vaccine here.   Up to date on Td vaccine   Separate significant chronic issues discussed: Depression - f/u with Dr. Toy Care soon for establish care appt regarding depression.   Kristi Avila was seen today for cpe.  Diagnoses and all orders for this visit:  Encounter for health maintenance examination in adult -     DG Bone Density; Future  Need for influenza vaccination -     Flu Vaccine QUAD 6+ mos PF IM (Fluarix Quad PF)  Vaccine counseling  Post-menopausal -     DG Bone Density; Future  Estrogen deficiency -     DG Bone Density; Future  Dyslipidemia  Family history of osteoporosis   Follow-up yearly for physical

## 2018-09-09 DIAGNOSIS — Z85828 Personal history of other malignant neoplasm of skin: Secondary | ICD-10-CM | POA: Diagnosis not present

## 2018-09-09 DIAGNOSIS — C44722 Squamous cell carcinoma of skin of right lower limb, including hip: Secondary | ICD-10-CM | POA: Diagnosis not present

## 2018-09-09 DIAGNOSIS — D1801 Hemangioma of skin and subcutaneous tissue: Secondary | ICD-10-CM | POA: Diagnosis not present

## 2018-09-09 DIAGNOSIS — L570 Actinic keratosis: Secondary | ICD-10-CM | POA: Diagnosis not present

## 2018-09-09 DIAGNOSIS — D1722 Benign lipomatous neoplasm of skin and subcutaneous tissue of left arm: Secondary | ICD-10-CM | POA: Diagnosis not present

## 2018-09-11 ENCOUNTER — Telehealth: Payer: Self-pay | Admitting: Medical

## 2018-09-11 NOTE — Telephone Encounter (Signed)
forward

## 2018-09-11 NOTE — Telephone Encounter (Signed)
Pt called and said for her referral for her bone density at [facility] can it be coded as preventive  so her insurance will pay for it.

## 2018-09-11 NOTE — Telephone Encounter (Signed)
Glad to hear  Please call to schedule your bone density test.   [facility]  [id number] [place], [place] [address]

## 2018-09-11 NOTE — Telephone Encounter (Signed)
Spoke to patient. She wants this procedure to be done at [facility] where it was done before. Her insurance company told her in order for it to be covered 100% it will have to be ordered as preventative. This if for her bone density.

## 2018-09-12 NOTE — Telephone Encounter (Signed)
We should have some [employer] forms.   Get me one and I'll put in the order on the form

## 2018-09-12 NOTE — Telephone Encounter (Signed)
Order has been faxed to [facility] °

## 2018-09-15 DIAGNOSIS — C44722 Squamous cell carcinoma of skin of right lower limb, including hip: Secondary | ICD-10-CM | POA: Diagnosis not present

## 2018-09-17 ENCOUNTER — Other Ambulatory Visit: Payer: Self-pay

## 2018-09-17 ENCOUNTER — Other Ambulatory Visit: Payer: BC Managed Care – PPO

## 2018-09-23 ENCOUNTER — Other Ambulatory Visit: Payer: Self-pay

## 2018-09-23 DIAGNOSIS — Z20822 Contact with and (suspected) exposure to covid-19: Secondary | ICD-10-CM

## 2018-09-23 DIAGNOSIS — R6889 Other general symptoms and signs: Secondary | ICD-10-CM | POA: Diagnosis not present

## 2018-09-25 LAB — NOVEL CORONAVIRUS, NAA: SARS-CoV-2, NAA: NOT DETECTED

## 2018-09-27 ENCOUNTER — Other Ambulatory Visit: Payer: Self-pay | Admitting: Medical

## 2018-09-29 DIAGNOSIS — F331 Major depressive disorder, recurrent, moderate: Secondary | ICD-10-CM | POA: Diagnosis not present

## 2018-09-30 ENCOUNTER — Other Ambulatory Visit: Payer: Self-pay | Admitting: Registered"

## 2018-09-30 DIAGNOSIS — R6889 Other general symptoms and signs: Secondary | ICD-10-CM | POA: Diagnosis not present

## 2018-09-30 DIAGNOSIS — Z20822 Contact with and (suspected) exposure to covid-19: Secondary | ICD-10-CM

## 2018-10-01 LAB — NOVEL CORONAVIRUS, NAA: SARS-CoV-2, NAA: NOT DETECTED

## 2018-10-02 ENCOUNTER — Telehealth: Payer: Self-pay | Admitting: Medical

## 2018-10-02 NOTE — Telephone Encounter (Signed)
Received requested records from [facility] 

## 2018-10-03 ENCOUNTER — Other Ambulatory Visit: Payer: Self-pay | Admitting: Medical

## 2018-10-03 NOTE — Telephone Encounter (Signed)
Kristi Avila is requesting to fill pt Cymbalta. Please advise [facility]

## 2018-10-06 NOTE — Telephone Encounter (Signed)
Call patient.  I believe she was establishing care with psychiatry who would take over this medication refill?

## 2018-10-07 ENCOUNTER — Encounter: Payer: Self-pay | Admitting: Medical

## 2018-10-07 NOTE — Telephone Encounter (Signed)
Patient will call to have psychiatrist feel her med.

## 2018-10-08 DIAGNOSIS — K219 Gastro-esophageal reflux disease without esophagitis: Secondary | ICD-10-CM | POA: Diagnosis not present

## 2018-10-08 DIAGNOSIS — Z96653 Presence of artificial knee joint, bilateral: Secondary | ICD-10-CM | POA: Diagnosis not present

## 2018-10-08 DIAGNOSIS — Z780 Asymptomatic menopausal state: Secondary | ICD-10-CM | POA: Diagnosis not present

## 2018-10-08 DIAGNOSIS — E2839 Other primary ovarian failure: Secondary | ICD-10-CM | POA: Diagnosis not present

## 2018-10-08 LAB — HM DEXA SCAN: HM Dexa Scan: NORMAL

## 2018-10-10 ENCOUNTER — Telehealth: Payer: Self-pay | Admitting: Medical

## 2018-10-10 NOTE — Telephone Encounter (Signed)
Please call and let her know that her recent bone density scan shows results of normal, not osteoporosis or osteopenia. I recommend weightbearing and aerobic exercise regularly I recommend supplement with vitamin D 600 units daily over-the-counter along with 1200 to 1500 mg of calcium either through supplements or diet daily

## 2018-10-13 NOTE — Telephone Encounter (Signed)
Same recommendations, but as far as calcium, can get 3-4 servings of dairy daily such as cottage cheese, milk, yogurt, or can try a lower milligrams supplement such as thousand milligrams daily or combo calcium plus vitamin D supplement

## 2018-10-13 NOTE — Telephone Encounter (Signed)
Pt was informed of results. She stated that calcium constipates her. Is there any other recommendation?

## 2018-10-14 ENCOUNTER — Ambulatory Visit (INDEPENDENT_AMBULATORY_CARE_PROVIDER_SITE_OTHER): Payer: BC Managed Care – PPO | Admitting: Medical

## 2018-10-14 ENCOUNTER — Encounter: Payer: Self-pay | Admitting: Medical

## 2018-10-14 ENCOUNTER — Other Ambulatory Visit: Payer: Self-pay

## 2018-10-14 VITALS — BP 110/70 | HR 71 | Temp 97.8°F | Ht 66.0 in | Wt 208.6 lb

## 2018-10-14 DIAGNOSIS — L03011 Cellulitis of right finger: Secondary | ICD-10-CM

## 2018-10-14 MED ORDER — HYDROCODONE-ACETAMINOPHEN 5-325 MG PO TABS: 1.0000 | ORAL_TABLET | Freq: Four times a day (QID) | ORAL | 0 refills | Status: DC | PRN

## 2018-10-14 MED ORDER — AMOXICILLIN-POT CLAVULANATE 875-125 MG PO TABS: 1.0000 | ORAL_TABLET | Freq: Two times a day (BID) | ORAL | 0 refills | Status: DC

## 2018-10-14 NOTE — Progress Notes (Signed)
Subjective:  Kristi Avila is a 64 y.o. female who presents for Chief Complaint  Patient presents with  . Hand Pain    middle finger of right hamd infected      Here for infection of middle finger right hand.  For the past week she has had swelling, redness, pain of the fingertip of the right middle finger.  She feels like she got this from the nail salon.  She has had this at least twice before after getting her nails done at the nail salon.  She has had pus come out of the tip of the finger the last several days.  No fever, no nausea vomiting, no body aches or chills.  No other complaint.  The following portions of the patient's history were reviewed and updated as appropriate: allergies, current medications, past family history, past medical history, past social history, past surgical history and problem list.  ROS Otherwise as in subjective above  Objective: BP 110/70   Pulse 71   Temp 97.8 F (36.6 C)   Ht 5\' 6"  (1.676 m)   Wt 208 lb 9.6 oz (94.6 kg)   SpO2 95%   BMI 33.67 kg/m   General appearance: alert, no distress, well developed, well nourished The distal tip of the right middle finger has mild swelling, quite tender but no obvious redness or pus.  There is 1 small area that appears irritated where there is probably been an opening for pus to drain on the lateral corner of the fingertip just under the nail Fingers and hand neurovascularly intact   Assessment: Encounter Diagnosis  Name Primary?  . Paronychia of finger, right Yes     Plan: Discussed exam findings consistent with paronychia.   Discussed treatment options. Discussed I&D, risks/benefits of procedure, and he consents and requested procedure.   Cleaned and prepped finger in usual sterile fashion.  Used ethyl acetate numbing spray to help with local anesthesia.  Used #11 blade for incision of the mildly swollen area of the nail bed.  Only expressed blood, no pus.  Cleaned the finger, covered with sterile  bandage.  Patient tolerated procedure well.  EBL < 1cc.   Begin medications below, discussed finger soaks with hot soapy water, good hygiene.  Consider not using this particular nail salon in the future.  If not much improved within the next 5 to 7 days then recheck  Kristi Avila was seen today for hand pain.  Diagnoses and all orders for this visit:  Paronychia of finger, right  Other orders -     amoxicillin-clavulanate (AUGMENTIN) 875-125 MG tablet; Take 1 tablet by mouth 2 (two) times daily. -     HYDROcodone-acetaminophen (NORCO) 5-325 MG tablet; Take 1 tablet by mouth every 6 (six) hours as needed for moderate pain.    Follow up: prn

## 2018-10-23 DIAGNOSIS — M25552 Pain in left hip: Secondary | ICD-10-CM | POA: Diagnosis not present

## 2018-10-24 ENCOUNTER — Other Ambulatory Visit: Payer: BC Managed Care – PPO

## 2018-10-24 ENCOUNTER — Encounter: Payer: Self-pay | Admitting: Medical

## 2018-11-05 NOTE — Telephone Encounter (Signed)
Patient was informed again of recommendations.

## 2018-11-05 NOTE — Telephone Encounter (Signed)
This message is still in my inbox, please check on this

## 2018-11-06 DIAGNOSIS — M545 Low back pain: Secondary | ICD-10-CM | POA: Diagnosis not present

## 2018-11-06 DIAGNOSIS — Z471 Aftercare following joint replacement surgery: Secondary | ICD-10-CM | POA: Diagnosis not present

## 2018-11-06 DIAGNOSIS — Z96641 Presence of right artificial hip joint: Secondary | ICD-10-CM | POA: Diagnosis not present

## 2018-11-06 DIAGNOSIS — M25552 Pain in left hip: Secondary | ICD-10-CM | POA: Diagnosis not present

## 2018-11-06 DIAGNOSIS — M5136 Other intervertebral disc degeneration, lumbar region: Secondary | ICD-10-CM | POA: Diagnosis not present

## 2018-11-06 DIAGNOSIS — Z96653 Presence of artificial knee joint, bilateral: Secondary | ICD-10-CM | POA: Diagnosis not present

## 2018-11-06 DIAGNOSIS — M542 Cervicalgia: Secondary | ICD-10-CM | POA: Diagnosis not present

## 2018-11-07 ENCOUNTER — Encounter: Payer: Self-pay | Admitting: Medical

## 2018-11-07 ENCOUNTER — Other Ambulatory Visit: Payer: Self-pay

## 2018-11-07 ENCOUNTER — Ambulatory Visit (INDEPENDENT_AMBULATORY_CARE_PROVIDER_SITE_OTHER): Payer: BC Managed Care – PPO | Admitting: Medical

## 2018-11-07 VITALS — BP 100/70 | HR 76 | Temp 97.8°F | Ht 66.0 in | Wt 208.2 lb

## 2018-11-07 DIAGNOSIS — N393 Stress incontinence (female) (male): Secondary | ICD-10-CM

## 2018-11-07 DIAGNOSIS — R350 Frequency of micturition: Secondary | ICD-10-CM

## 2018-11-07 DIAGNOSIS — K5900 Constipation, unspecified: Secondary | ICD-10-CM

## 2018-11-07 DIAGNOSIS — R300 Dysuria: Secondary | ICD-10-CM | POA: Insufficient documentation

## 2018-11-07 DIAGNOSIS — M160 Bilateral primary osteoarthritis of hip: Secondary | ICD-10-CM

## 2018-11-07 LAB — POCT URINALYSIS DIP (PROADVANTAGE DEVICE)
Bilirubin, UA: NEGATIVE
Blood, UA: NEGATIVE
Glucose, UA: NEGATIVE mg/dL
Ketones, POC UA: NEGATIVE mg/dL
Leukocytes, UA: NEGATIVE
Nitrite, UA: NEGATIVE
Protein Ur, POC: NEGATIVE mg/dL
Specific Gravity, Urine: 1.01
Urobilinogen, Ur: NEGATIVE
pH, UA: 6 (ref 5.0–8.0)

## 2018-11-07 MED ORDER — MELOXICAM 15 MG PO TABS: 15.0000 mg | ORAL_TABLET | Freq: Every day | ORAL | 1 refills | Status: DC

## 2018-11-07 NOTE — Progress Notes (Addendum)
Subjective: Chief Complaint  Patient presents with  . Urinary Frequency    with left side soreness and lower back pain    Here for possible urinary tract infection.  She notes for 2 weeks on not having some discomfort in the left low back and left low abdomen, has ongoing back problems.  Recently had injections in her back and her hip.  Sees orthopedics.  She took some leftover Cipro she had.  She notes some urinary frequency.  No urine odor, no vaginal discharge, no burning with urination, no blood in the urine, no cloudy urine.  No fever no nausea or vomiting related to symptoms.  She is on medicine for nausea related to Trintellix which she is taking she is not tolerating very well.  She notes chronic constipation.  Uses MiraLAX but recently had illness some magnesium.  She had recently been on some hydrocodone per orthopedics.  Orthopedics had her to come in today to ask increase the meloxicam to 15 mg daily.  She has cut back on soft drinks recently.  Concern for STD.  No sexual intercourse in a year.  No other aggravating or relieving factors. No other complaint.  Past Medical History:  Diagnosis Date  . Arthritis   . Barrett's esophagus with esophagitis 07/23/12   EGD: CLO test results negative. pathology: Barretss: Repeat EGD 3 years  . Blood transfusion without reported diagnosis   . Depression   . Family history of anesthesia complication    mother trouble waking up  . Frequency of urination   . GERD (gastroesophageal reflux disease)    h/o peptic ulcer  . High cholesterol   . HSV-2 infection    anal  . Nocturia   . Obesity   . Overactive bladder   . Reflux   . Urgency of urination    Current Outpatient Medications on File Prior to Visit  Medication Sig Dispense Refill  . ALPRAZolam (XANAX) 0.5 MG tablet alprazolam 0.5 mg tablet    . DEXILANT 60 MG capsule TAKE 1 CAPSULE(60 MG) BY MOUTH DAILY 90 capsule 0  . DULoxetine (CYMBALTA) 60 MG capsule TAKE 2 CAPSULE BY MOUTH EVERY  DAY. 180 capsule 0  . ezetimibe (ZETIA) 10 MG tablet TAKE 1 TABLET(10 MG) BY MOUTH EVERY MORNING 90 tablet 0  . famotidine (PEPCID) 20 MG tablet Take 1 tablet (20 mg total) by mouth at bedtime. 30 tablet 5  . fluticasone (FLONASE) 50 MCG/ACT nasal spray SHAKE LIQUID AND USE 1 SPRAY IN EACH NOSTRIL DAILY 16 g 11  . meloxicam (MOBIC) 7.5 MG tablet Take 1 tablet (7.5 mg total) by mouth daily. 90 tablet 0  . Multiple Vitamin (MULTIVITAMIN) tablet Take 1 tablet by mouth daily.    Marland Kitchen OVER THE COUNTER MEDICATION Vitamin B12    . OVER THE COUNTER MEDICATION Vitamin d 3    . solifenacin (VESICARE) 10 MG tablet Take 1 tablet (10 mg total) by mouth daily. 90 tablet 4  . tamsulosin (FLOMAX) 0.4 MG CAPS capsule Take 0.4 mg by mouth.    . valACYclovir (VALTREX) 500 MG tablet Take 1 tablet (500 mg total) by mouth daily. 90 tablet 1  . vortioxetine HBr (TRINTELLIX) 10 MG TABS tablet Take 10 mg by mouth daily.     No current facility-administered medications on file prior to visit.    ROS as in subjective   Objective: BP 100/70   Pulse 76   Temp 97.8 F (36.6 C)   Ht 5\' 6"  (1.676 m)  Wt 208 lb 3.2 oz (94.4 kg)   SpO2 94%   BMI 33.60 kg/m   Gen: wd, wn, nad Abdomen: mild tenderness in left lower quandrant, otherwise nontender, no mass, no organomegaly Back nontender   Assessment: Encounter Diagnoses  Name Primary?  . Frequent urination Yes  . Dysuria   . Constipation, unspecified constipation type   . Stress incontinence   . Primary osteoarthritis of both hips     Plan: We discussed her symptoms of frequent urination dysuria.  Urinalysis was normal.  Is not clear that she has a UTI.  We will send for culture.  We discussed other possible cause of her symptoms.  She does have chronic constipation.  She has stress incontinence.  She drinks a fair amount of caffeine.  I reviewed urology notes from prior records.  She has a longstanding history of stress incontinence.  Currently she is  taking both Vesicare and Flomax.  We will temporarily stop Flomax to see if this makes any difference in her symptoms.  We discussed the possibility of atrophic changes.  She will follow-up with gynecology as planned in December.  We discussed cutting back on caffeine.  Osteoarthritis-increased meloxicam to 15 mg at her request after she discussed with orthopedics.  We reviewed her lab work from earlier in the year including normal kidney function.  Constipation-continue MiraLAX, can add some short-term magnesium/milk of magnesia  Erma was seen today for urinary frequency.  Diagnoses and all orders for this visit:  Frequent urination -     POCT Urinalysis DIP (Proadvantage Device) -     Urine Culture  Dysuria -     Urine Culture  Constipation, unspecified constipation type  Stress incontinence  Primary osteoarthritis of both hips  Other orders -     meloxicam (MOBIC) 15 MG tablet; Take 1 tablet (15 mg total) by mouth daily.

## 2018-11-08 LAB — URINE CULTURE: Organism ID, Bacteria: NO GROWTH

## 2018-11-10 DIAGNOSIS — F410 Panic disorder [episodic paroxysmal anxiety] without agoraphobia: Secondary | ICD-10-CM | POA: Diagnosis not present

## 2018-11-10 DIAGNOSIS — F3342 Major depressive disorder, recurrent, in full remission: Secondary | ICD-10-CM | POA: Diagnosis not present

## 2018-11-17 ENCOUNTER — Other Ambulatory Visit: Payer: Self-pay | Admitting: Medical

## 2018-11-20 DIAGNOSIS — M542 Cervicalgia: Secondary | ICD-10-CM | POA: Diagnosis not present

## 2018-11-20 DIAGNOSIS — C44722 Squamous cell carcinoma of skin of right lower limb, including hip: Secondary | ICD-10-CM | POA: Diagnosis not present

## 2018-11-20 DIAGNOSIS — Z85828 Personal history of other malignant neoplasm of skin: Secondary | ICD-10-CM | POA: Diagnosis not present

## 2018-11-28 ENCOUNTER — Other Ambulatory Visit: Payer: Self-pay

## 2018-11-28 ENCOUNTER — Other Ambulatory Visit (INDEPENDENT_AMBULATORY_CARE_PROVIDER_SITE_OTHER): Payer: BC Managed Care – PPO

## 2018-11-28 DIAGNOSIS — Z23 Encounter for immunization: Secondary | ICD-10-CM

## 2018-12-03 ENCOUNTER — Other Ambulatory Visit: Payer: Self-pay | Admitting: Medical

## 2018-12-03 ENCOUNTER — Telehealth: Payer: Self-pay

## 2018-12-03 DIAGNOSIS — M542 Cervicalgia: Secondary | ICD-10-CM | POA: Diagnosis not present

## 2018-12-03 MED ORDER — LINACLOTIDE 72 MCG PO CAPS: ORAL_CAPSULE | ORAL | 0 refills | Status: DC

## 2018-12-03 NOTE — Telephone Encounter (Signed)
done

## 2018-12-03 NOTE — Telephone Encounter (Signed)
Pt. Called stating that she needs a refill on her linzess 69mcg capsules 1 daily to the [facility] on [address] she requested a refill the other day from the pharmacy but has not heard anything back yet. Last apt. Was 11/07/18

## 2018-12-08 ENCOUNTER — Telehealth: Payer: Self-pay

## 2018-12-08 ENCOUNTER — Other Ambulatory Visit: Payer: Self-pay | Admitting: Medical

## 2018-12-08 ENCOUNTER — Telehealth: Payer: Self-pay | Admitting: Medical

## 2018-12-08 MED ORDER — TRULANCE 3 MG PO TABS: 3.0000 mg | ORAL_TABLET | Freq: Every day | ORAL | 2 refills | Status: DC

## 2018-12-08 NOTE — Telephone Encounter (Signed)
Let her know insurance requirement.  I sent Trulance as an alternate to Linzess.  I think Trulance actually works as good or better.

## 2018-12-08 NOTE — Telephone Encounter (Signed)
PA Linzess denied pt. Must try trulance. Do you want to switch?

## 2018-12-08 NOTE — Telephone Encounter (Signed)
P.A. Mellody Memos also required, this was completed

## 2018-12-09 NOTE — Telephone Encounter (Signed)
Pt. Is aware of change in medication and that the new medicine Trulance has been sent in and we have started a PA on that medication.

## 2018-12-09 NOTE — Telephone Encounter (Signed)
P.A. approved, activated discount card and called pharmacy went thru for $0, called pt

## 2018-12-16 DIAGNOSIS — L570 Actinic keratosis: Secondary | ICD-10-CM | POA: Diagnosis not present

## 2018-12-16 DIAGNOSIS — D485 Neoplasm of uncertain behavior of skin: Secondary | ICD-10-CM | POA: Diagnosis not present

## 2018-12-17 ENCOUNTER — Other Ambulatory Visit: Payer: Self-pay | Admitting: Medical

## 2018-12-17 ENCOUNTER — Telehealth: Payer: Self-pay | Admitting: Medical

## 2018-12-17 MED ORDER — TAMSULOSIN HCL 0.4 MG PO CAPS: 0.4000 mg | ORAL_CAPSULE | Freq: Every day | ORAL | 2 refills | Status: DC

## 2018-12-17 NOTE — Telephone Encounter (Signed)
done

## 2018-12-17 NOTE — Telephone Encounter (Signed)
Pt called and wanted to see if you could refill her Tamsulosin. She stated that it is normally filled by some one else but she was having trouble getting in with him. She uses the [facility] on [address]

## 2018-12-24 DIAGNOSIS — L565 Disseminated superficial actinic porokeratosis (DSAP): Secondary | ICD-10-CM | POA: Diagnosis not present

## 2018-12-24 DIAGNOSIS — L280 Lichen simplex chronicus: Secondary | ICD-10-CM | POA: Diagnosis not present

## 2018-12-24 DIAGNOSIS — R21 Rash and other nonspecific skin eruption: Secondary | ICD-10-CM | POA: Diagnosis not present

## 2018-12-24 DIAGNOSIS — Z85828 Personal history of other malignant neoplasm of skin: Secondary | ICD-10-CM | POA: Diagnosis not present

## 2018-12-24 DIAGNOSIS — L570 Actinic keratosis: Secondary | ICD-10-CM | POA: Diagnosis not present

## 2018-12-26 DIAGNOSIS — M5136 Other intervertebral disc degeneration, lumbar region: Secondary | ICD-10-CM | POA: Diagnosis not present

## 2018-12-26 DIAGNOSIS — M5030 Other cervical disc degeneration, unspecified cervical region: Secondary | ICD-10-CM | POA: Diagnosis not present

## 2018-12-29 DIAGNOSIS — Z1151 Encounter for screening for human papillomavirus (HPV): Secondary | ICD-10-CM | POA: Diagnosis not present

## 2018-12-29 DIAGNOSIS — Z6833 Body mass index (BMI) 33.0-33.9, adult: Secondary | ICD-10-CM | POA: Diagnosis not present

## 2018-12-29 DIAGNOSIS — N3289 Other specified disorders of bladder: Secondary | ICD-10-CM | POA: Diagnosis not present

## 2018-12-29 DIAGNOSIS — Z124 Encounter for screening for malignant neoplasm of cervix: Secondary | ICD-10-CM | POA: Diagnosis not present

## 2018-12-29 DIAGNOSIS — Z01419 Encounter for gynecological examination (general) (routine) without abnormal findings: Secondary | ICD-10-CM | POA: Diagnosis not present

## 2019-01-01 DIAGNOSIS — Z1231 Encounter for screening mammogram for malignant neoplasm of breast: Secondary | ICD-10-CM | POA: Diagnosis not present

## 2019-01-01 LAB — HM MAMMOGRAPHY

## 2019-01-06 DIAGNOSIS — M542 Cervicalgia: Secondary | ICD-10-CM | POA: Diagnosis not present

## 2019-01-15 ENCOUNTER — Ambulatory Visit: Payer: BC Managed Care – PPO | Attending: Internal Medicine

## 2019-01-15 DIAGNOSIS — Z20828 Contact with and (suspected) exposure to other viral communicable diseases: Secondary | ICD-10-CM | POA: Diagnosis not present

## 2019-01-15 DIAGNOSIS — Z20822 Contact with and (suspected) exposure to covid-19: Secondary | ICD-10-CM

## 2019-01-16 LAB — NOVEL CORONAVIRUS, NAA: SARS-CoV-2, NAA: NOT DETECTED

## 2019-01-19 ENCOUNTER — Other Ambulatory Visit: Payer: Self-pay | Admitting: Medical

## 2019-01-21 ENCOUNTER — Ambulatory Visit: Payer: BC Managed Care – PPO | Attending: Internal Medicine

## 2019-01-21 DIAGNOSIS — Z20822 Contact with and (suspected) exposure to covid-19: Secondary | ICD-10-CM | POA: Diagnosis not present

## 2019-01-22 LAB — NOVEL CORONAVIRUS, NAA: SARS-CoV-2, NAA: NOT DETECTED

## 2019-01-28 ENCOUNTER — Other Ambulatory Visit (INDEPENDENT_AMBULATORY_CARE_PROVIDER_SITE_OTHER): Payer: BC Managed Care – PPO

## 2019-01-28 ENCOUNTER — Other Ambulatory Visit: Payer: Self-pay

## 2019-01-28 DIAGNOSIS — Z23 Encounter for immunization: Secondary | ICD-10-CM | POA: Diagnosis not present

## 2019-01-30 ENCOUNTER — Other Ambulatory Visit: Payer: Self-pay

## 2019-02-09 DIAGNOSIS — M5136 Other intervertebral disc degeneration, lumbar region: Secondary | ICD-10-CM | POA: Diagnosis not present

## 2019-02-09 DIAGNOSIS — M5030 Other cervical disc degeneration, unspecified cervical region: Secondary | ICD-10-CM | POA: Diagnosis not present

## 2019-02-15 ENCOUNTER — Other Ambulatory Visit: Payer: Self-pay | Admitting: Medical

## 2019-02-23 ENCOUNTER — Ambulatory Visit: Payer: BC Managed Care – PPO | Attending: Internal Medicine

## 2019-02-23 DIAGNOSIS — Z23 Encounter for immunization: Secondary | ICD-10-CM | POA: Insufficient documentation

## 2019-02-23 NOTE — Progress Notes (Signed)
   Covid-19 Vaccination Clinic  Name:  Kristi Avila    MRN: [id number] DOB: [birth year]  02/23/2019  [name] was observed post Covid-19 immunization for 15 minutes without incidence. She was provided with Vaccine Information Sheet and instruction to access the V-Safe system.   [name] was instructed to call 911 with any severe reactions post vaccine: Marland Kitchen Difficulty breathing  . Swelling of your face and throat  . A fast heartbeat  . A bad rash all over your body  . Dizziness and weakness    Immunizations Administered    Name Date Dose VIS Date Route   Pfizer COVID-19 Vaccine 02/23/2019  4:13 PM 0.3 mL 12/26/2018 Intramuscular   Manufacturer: [place]   Lot: [id number]   [place]: [id number]

## 2019-02-26 ENCOUNTER — Other Ambulatory Visit: Payer: Self-pay | Admitting: Medical

## 2019-02-26 MED ORDER — CIPROFLOXACIN HCL 250 MG PO TABS: 250.0000 mg | ORAL_TABLET | Freq: Every day | ORAL | 1 refills | Status: AC | PRN

## 2019-03-03 DIAGNOSIS — M5136 Other intervertebral disc degeneration, lumbar region: Secondary | ICD-10-CM | POA: Diagnosis not present

## 2019-03-03 DIAGNOSIS — M5030 Other cervical disc degeneration, unspecified cervical region: Secondary | ICD-10-CM | POA: Diagnosis not present

## 2019-03-09 ENCOUNTER — Other Ambulatory Visit: Payer: Self-pay | Admitting: Medical

## 2019-03-12 DIAGNOSIS — L821 Other seborrheic keratosis: Secondary | ICD-10-CM | POA: Diagnosis not present

## 2019-03-12 DIAGNOSIS — D0471 Carcinoma in situ of skin of right lower limb, including hip: Secondary | ICD-10-CM | POA: Diagnosis not present

## 2019-03-12 DIAGNOSIS — Z85828 Personal history of other malignant neoplasm of skin: Secondary | ICD-10-CM | POA: Diagnosis not present

## 2019-03-12 DIAGNOSIS — L570 Actinic keratosis: Secondary | ICD-10-CM | POA: Diagnosis not present

## 2019-03-17 ENCOUNTER — Other Ambulatory Visit: Payer: Self-pay

## 2019-03-17 ENCOUNTER — Telehealth: Payer: Self-pay | Admitting: Medical

## 2019-03-17 ENCOUNTER — Other Ambulatory Visit: Payer: Self-pay | Admitting: Medical

## 2019-03-17 MED ORDER — FLUTICASONE PROPIONATE 50 MCG/ACT NA SUSP: NASAL | 1 refills | Status: DC

## 2019-03-17 NOTE — Telephone Encounter (Signed)
Pt called and made a medcheck appt for 03/10 in the afternoon. She would like to come in this Friday for labs. Please place labs and advise pt.  ALSO she needs a refill at [facility]. She is out. Please send to [facility].

## 2019-03-17 NOTE — Telephone Encounter (Signed)
Sent patient a message via mychart that she will need to fast for appointment

## 2019-03-17 NOTE — Telephone Encounter (Signed)
Medication has been sent to the pharmacy. 

## 2019-03-17 NOTE — Telephone Encounter (Signed)
Pt called and made a medcheck appt. She would like to know if she needs to be fasting. Please advise pt. She may want to change appt if she needs to be fasting.

## 2019-03-17 NOTE — Telephone Encounter (Signed)
Please place orders for blood work

## 2019-03-18 ENCOUNTER — Other Ambulatory Visit: Payer: Self-pay | Admitting: Medical

## 2019-03-18 DIAGNOSIS — E785 Hyperlipidemia, unspecified: Secondary | ICD-10-CM

## 2019-03-18 DIAGNOSIS — Z131 Encounter for screening for diabetes mellitus: Secondary | ICD-10-CM

## 2019-03-18 DIAGNOSIS — Z79899 Other long term (current) drug therapy: Secondary | ICD-10-CM

## 2019-03-18 NOTE — Telephone Encounter (Signed)
Was she wanting diabetes marker run?   This doesn't have to be fasting.     Otherwise, I'm not so sure we need to do any labs currently for this med check other than possibly liver/kidney marker if she continues to take anti-inflammatory daily?  I reviewed labs from 07/2018, and most of those don't need repeating right now.

## 2019-03-18 NOTE — Telephone Encounter (Signed)
Message has been sent to patient via mychart  

## 2019-03-19 ENCOUNTER — Encounter: Payer: BC Managed Care – PPO | Admitting: Medical

## 2019-03-20 ENCOUNTER — Other Ambulatory Visit: Payer: BC Managed Care – PPO

## 2019-03-20 ENCOUNTER — Other Ambulatory Visit: Payer: Self-pay

## 2019-03-20 ENCOUNTER — Ambulatory Visit: Payer: BC Managed Care – PPO | Attending: Internal Medicine

## 2019-03-20 DIAGNOSIS — Z131 Encounter for screening for diabetes mellitus: Secondary | ICD-10-CM

## 2019-03-20 DIAGNOSIS — E785 Hyperlipidemia, unspecified: Secondary | ICD-10-CM | POA: Diagnosis not present

## 2019-03-20 DIAGNOSIS — Z79899 Other long term (current) drug therapy: Secondary | ICD-10-CM

## 2019-03-20 DIAGNOSIS — Z23 Encounter for immunization: Secondary | ICD-10-CM | POA: Insufficient documentation

## 2019-03-20 NOTE — Progress Notes (Signed)
   Covid-19 Vaccination Clinic  Name:  Kristi Avila    MRN: [id number] DOB: [birth year]  03/20/2019  [name] was observed post Covid-19 immunization for 15 minutes without incident. She was provided with Vaccine Information Sheet and instruction to access the V-Safe system.   [name] was instructed to call 911 with any severe reactions post vaccine: Marland Kitchen Difficulty breathing  . Swelling of face and throat  . A fast heartbeat  . A bad rash all over body  . Dizziness and weakness   Immunizations Administered    Name Date Dose VIS Date Route   Pfizer COVID-19 Vaccine 03/20/2019  1:41 PM 0.3 mL 12/26/2018 Intramuscular   Manufacturer: [place]   Lot: [id number]   [place]: [id number]

## 2019-03-21 LAB — COMPREHENSIVE METABOLIC PANEL
ALT: 17 IU/L (ref 0–32)
AST: 18 IU/L (ref 0–40)
Albumin/Globulin Ratio: 2.6 — ABNORMAL HIGH (ref 1.2–2.2)
Albumin: 4.5 g/dL (ref 3.8–4.8)
Alkaline Phosphatase: 83 IU/L (ref 39–117)
BUN/Creatinine Ratio: 21 (ref 12–28)
BUN: 18 mg/dL (ref 8–27)
Bilirubin Total: 0.4 mg/dL (ref 0.0–1.2)
CO2: 25 mmol/L (ref 20–29)
Calcium: 9.8 mg/dL (ref 8.7–10.3)
Chloride: 103 mmol/L (ref 96–106)
Creatinine, Ser: 0.87 mg/dL (ref 0.57–1.00)
GFR calc Af Amer: 83 mL/min/{1.73_m2} (ref >59)
GFR calc non Af Amer: 72 mL/min/{1.73_m2} (ref >59)
Globulin, Total: 1.7 g/dL (ref 1.5–4.5)
Glucose: 92 mg/dL (ref 65–99)
Potassium: 4.8 mmol/L (ref 3.5–5.2)
Sodium: 142 mmol/L (ref 134–144)
Total Protein: 6.2 g/dL (ref 6.0–8.5)

## 2019-03-21 LAB — HEMOGLOBIN A1C
Est. average glucose Bld gHb Est-mCnc: 108 mg/dL
Hgb A1c MFr Bld: 5.4 % (ref 4.8–5.6)

## 2019-03-25 ENCOUNTER — Other Ambulatory Visit: Payer: Self-pay

## 2019-03-25 ENCOUNTER — Ambulatory Visit (INDEPENDENT_AMBULATORY_CARE_PROVIDER_SITE_OTHER): Payer: BC Managed Care – PPO | Admitting: Medical

## 2019-03-25 ENCOUNTER — Encounter: Payer: Self-pay | Admitting: Medical

## 2019-03-25 VITALS — BP 110/76 | HR 77 | Temp 97.7°F | Ht 66.0 in | Wt 205.4 lb

## 2019-03-25 DIAGNOSIS — M8949 Other hypertrophic osteoarthropathy, multiple sites: Secondary | ICD-10-CM

## 2019-03-25 DIAGNOSIS — K5900 Constipation, unspecified: Secondary | ICD-10-CM | POA: Diagnosis not present

## 2019-03-25 DIAGNOSIS — M150 Primary generalized (osteo)arthritis: Secondary | ICD-10-CM

## 2019-03-25 DIAGNOSIS — G8929 Other chronic pain: Secondary | ICD-10-CM

## 2019-03-25 DIAGNOSIS — M545 Low back pain, unspecified: Secondary | ICD-10-CM | POA: Insufficient documentation

## 2019-03-25 DIAGNOSIS — B009 Herpesviral infection, unspecified: Secondary | ICD-10-CM

## 2019-03-25 DIAGNOSIS — N393 Stress incontinence (female) (male): Secondary | ICD-10-CM | POA: Diagnosis not present

## 2019-03-25 DIAGNOSIS — F32A Depression, unspecified: Secondary | ICD-10-CM

## 2019-03-25 DIAGNOSIS — F329 Major depressive disorder, single episode, unspecified: Secondary | ICD-10-CM

## 2019-03-25 DIAGNOSIS — F419 Anxiety disorder, unspecified: Secondary | ICD-10-CM | POA: Diagnosis not present

## 2019-03-25 DIAGNOSIS — K140 Glossitis: Secondary | ICD-10-CM

## 2019-03-25 DIAGNOSIS — M159 Polyosteoarthritis, unspecified: Secondary | ICD-10-CM

## 2019-03-25 MED ORDER — TRIAMCINOLONE ACETONIDE 0.1 % MT PSTE: 1.0000 "application " | PASTE | Freq: Two times a day (BID) | OROMUCOSAL | 0 refills | Status: DC

## 2019-03-25 MED ORDER — LINACLOTIDE 72 MCG PO CAPS: 72.0000 ug | ORAL_CAPSULE | Freq: Every day | ORAL | 2 refills | Status: DC

## 2019-03-25 NOTE — Progress Notes (Signed)
Subjective:  Kristi Avila is a 64 y.o. female who presents for Chief Complaint  Patient presents with  . Medication Management  . Constipation  . Cyst    on left shoulder   . Back Pain    with off balance   . Panic Attack  . Oral Swelling    sores on tongue       Here for multiple concerns  She sees psychiatry, Dr. Toy Care.  Not seeing counseling.  She notes in recent months having more problems with anxiety.  She uses Xanax and Cymbalta already.  She has had no panic attacks [place] alone.  On one particular occasion she was on a conference call on the phone while driving and was already stressed out about that it was worried about things happening to her driving on the road.  Her car was making some unusual noise.  It was also foggy outside and raining which made it worse.  She gets panicky at other times as well but overall the frequency of her panic attacks have increased  She sees Dr. Herma Mering for pain management and Dr. Reynaldo Minium orthopedic for chronic back pain and joint pains.  She had a recent steroid injection.  Since then she has felt better and was doing some exercises including leg lifts which she thinks have inflamed her back.  Last back imaging 3 months ago.  No saddle anesthesia, incontinence, fever or worse pain down legs.  She does have a history of degenerative disease and lumbar back surgery  She notes a sore in her tongue on the right for about 6 weeks and her tongue gets inflamed particularly if drinking or eating certain foods such as soda.  No obvious lesions of the tongue  She has long-term problems with constipation.  Used to take Linzess which worked Engineer, manufacturing.  The Trulance she is taking now does not seem to help.  Here to review labs where she came in for fasting labs recently.  she likes to keep a check on her kidney and liver giving the medication she takes  no other aggravating or relieving factors.    No other c/o.  Past Medical  History:  Diagnosis Date  . Arthritis   . Barrett's esophagus with esophagitis 07/23/12   EGD: CLO test results negative. pathology: Barretss: Repeat EGD 3 years  . Blood transfusion without reported diagnosis   . Depression   . Family history of anesthesia complication    mother trouble waking up  . Frequency of urination   . GERD (gastroesophageal reflux disease)    h/o peptic ulcer  . High cholesterol   . HSV-2 infection    anal  . Nocturia   . Obesity   . Overactive bladder   . Reflux   . Urgency of urination    Current Outpatient Medications on File Prior to Visit  Medication Sig Dispense Refill  . ALPRAZolam (XANAX) 0.5 MG tablet alprazolam 0.5 mg tablet    . ciprofloxacin (CIPRO) 250 MG tablet ciprofloxacin 250 mg tablet    . DEXILANT 60 MG capsule TAKE 1 CAPSULE(60 MG) BY MOUTH DAILY 90 capsule 0  . DULoxetine (CYMBALTA) 60 MG capsule TAKE 2 CAPSULE BY MOUTH EVERY DAY. 180 capsule 0  . ezetimibe (ZETIA) 10 MG tablet TAKE 1 TABLET(10 MG) BY MOUTH EVERY MORNING 90 tablet 0  . famotidine (PEPCID) 20 MG tablet Take 1 tablet (20 mg total) by mouth at bedtime. 30 tablet 5  .  fluticasone (FLONASE) 50 MCG/ACT nasal spray SHAKE LIQUID AND USE 1 SPRAY IN EACH NOSTRIL DAILY 16 g 1  . HYDROcodone-acetaminophen (NORCO/VICODIN) 5-325 MG tablet Take 1 tablet by mouth 2 (two) times daily as needed.    . meloxicam (MOBIC) 15 MG tablet Take 1 tablet (15 mg total) by mouth daily. 30 tablet 1  . Multiple Vitamin (MULTIVITAMIN) tablet Take 1 tablet by mouth daily.    Marland Kitchen OVER THE COUNTER MEDICATION Vitamin B12    . OVER THE COUNTER MEDICATION Vitamin d 3    . solifenacin (VESICARE) 10 MG tablet TAKE 1 TABLET BY MOUTH EVERY DAY 30 tablet 0  . tamsulosin (FLOMAX) 0.4 MG CAPS capsule TAKE 1 CAPSULE(0.4 MG) BY MOUTH DAILY 30 capsule 2  . TRULANCE 3 MG TABS TAKE 1 TABLET BY MOUTH DAILY 30 tablet 2  . valACYclovir (VALTREX) 500 MG tablet Take 1 tablet (500 mg total) by mouth daily. 90 tablet 1  .  meloxicam (MOBIC) 7.5 MG tablet Take 1 tablet (7.5 mg total) by mouth daily. (Patient not taking: Reported on 03/25/2019) 90 tablet 0   No current facility-administered medications on file prior to visit.    The following portions of the patient's history were reviewed and updated as appropriate: allergies, current medications, past family history, past medical history, past social history, past surgical history and problem list.  ROS Otherwise as in subjective above  Objective: BP 110/76   Pulse 77   Temp 97.7 F (36.5 C)   Ht 5\' 6"  (1.676 m)   Wt 205 lb 6.4 oz (93.2 kg)   SpO2 98%   BMI 33.15 kg/m   General appearance: alert, no distress, well developed, well nourished HEENT: normocephalic, pharynx normal Oral cavity: MMM,right lateral tongue with mildly tender area but no obvious lesion Neck: supple, no lymphadenopathy, no thyromegaly, no masses Heart: RRR, normal S1, S2, no murmurs Lungs: CTA bilaterally, no wheezes, rhonchi, or rales Pulses: 2+ radial pulses, 2+ pedal pulses, normal cap refill Ext: no edema Psych: pleasant, good eye contact, answers questions appropriately    Assessment: Encounter Diagnoses  Name Primary?  . Constipation, unspecified constipation type Yes  . Anxiety and depression   . HSV-2 infection   . Stress incontinence   . Primary osteoarthritis involving multiple joints   . Chronic low back pain, unspecified back pain laterality, unspecified whether sciatica present   . Tongue inflammation      Plan: Constipation-begin back on Linzess if insurance covers this.  Stop Trulance.  Discussed importance of water and fiber intake  Anxiety and depression-we spent most of the visit on this.  Continue current medication.  Continue follow-up with psychiatry but consider counseling.  We discussed ways to cope with anxiety and panic  History of herpes infection-uses Valtrex daily for prevention.  Incontinence-temporarily I asked her to stop flomax  for the next 2 weeks to see if her dry mouth improves and likewise see if the mouth and tongue irritation improves as well.  Call back if not improving  Low back pain, chronic, osteoarthritis-advise she follow back up with orthopedics since they manage her pain and pain medication  Tongue irritation-avoid spicy foods, citrus, acidic foods, can use salt water rinse, avoid soda for now, can use triamcinolone and Orabase as below topically for the lesion on the right but if not improving within the next week then follow back up   Avonne was seen today for medication management, constipation, cyst, back pain, panic attack and oral swelling.  Diagnoses  and all orders for this visit:  Constipation, unspecified constipation type  Anxiety and depression  HSV-2 infection  Stress incontinence  Primary osteoarthritis involving multiple joints  Chronic low back pain, unspecified back pain laterality, unspecified whether sciatica present  Tongue inflammation  Other orders -     linaclotide (LINZESS) 72 MCG capsule; Take 1 capsule (72 mcg total) by mouth daily before breakfast. -     triamcinolone (KENALOG) 0.1 % paste; Use as directed 1 application in the mouth or throat 2 (two) times daily.    Follow up: 7mo

## 2019-04-16 ENCOUNTER — Other Ambulatory Visit: Payer: Self-pay | Admitting: Medical

## 2019-04-22 DIAGNOSIS — R350 Frequency of micturition: Secondary | ICD-10-CM | POA: Diagnosis not present

## 2019-04-22 DIAGNOSIS — K5901 Slow transit constipation: Secondary | ICD-10-CM | POA: Diagnosis not present

## 2019-04-22 DIAGNOSIS — N952 Postmenopausal atrophic vaginitis: Secondary | ICD-10-CM | POA: Diagnosis not present

## 2019-04-22 DIAGNOSIS — K5909 Other constipation: Secondary | ICD-10-CM | POA: Diagnosis not present

## 2019-04-27 DIAGNOSIS — K1379 Other lesions of oral mucosa: Secondary | ICD-10-CM | POA: Diagnosis not present

## 2019-05-02 ENCOUNTER — Other Ambulatory Visit: Payer: Self-pay | Admitting: Medical

## 2019-05-14 DIAGNOSIS — R350 Frequency of micturition: Secondary | ICD-10-CM | POA: Diagnosis not present

## 2019-05-14 DIAGNOSIS — M6281 Muscle weakness (generalized): Secondary | ICD-10-CM | POA: Diagnosis not present

## 2019-05-14 DIAGNOSIS — M62838 Other muscle spasm: Secondary | ICD-10-CM | POA: Diagnosis not present

## 2019-05-16 ENCOUNTER — Other Ambulatory Visit: Payer: Self-pay | Admitting: Medical

## 2019-05-21 DIAGNOSIS — L570 Actinic keratosis: Secondary | ICD-10-CM | POA: Diagnosis not present

## 2019-05-21 DIAGNOSIS — L565 Disseminated superficial actinic porokeratosis (DSAP): Secondary | ICD-10-CM | POA: Diagnosis not present

## 2019-05-21 DIAGNOSIS — Z85828 Personal history of other malignant neoplasm of skin: Secondary | ICD-10-CM | POA: Diagnosis not present

## 2019-06-08 ENCOUNTER — Other Ambulatory Visit: Payer: Self-pay | Admitting: Medical

## 2019-06-15 ENCOUNTER — Other Ambulatory Visit: Payer: Self-pay | Admitting: Medical

## 2019-06-20 ENCOUNTER — Other Ambulatory Visit: Payer: Self-pay | Admitting: Medical

## 2019-07-09 DIAGNOSIS — M7062 Trochanteric bursitis, left hip: Secondary | ICD-10-CM | POA: Diagnosis not present

## 2019-07-09 DIAGNOSIS — Z96641 Presence of right artificial hip joint: Secondary | ICD-10-CM | POA: Diagnosis not present

## 2019-07-09 DIAGNOSIS — Z471 Aftercare following joint replacement surgery: Secondary | ICD-10-CM | POA: Diagnosis not present

## 2019-07-09 DIAGNOSIS — Z96653 Presence of artificial knee joint, bilateral: Secondary | ICD-10-CM | POA: Diagnosis not present

## 2019-07-16 ENCOUNTER — Other Ambulatory Visit: Payer: Self-pay | Admitting: Family Medicine

## 2019-07-16 NOTE — Telephone Encounter (Signed)
[facility] is requesting to fill pt meloxicam. Please advise Kh

## 2019-07-28 DIAGNOSIS — D485 Neoplasm of uncertain behavior of skin: Secondary | ICD-10-CM | POA: Diagnosis not present

## 2019-07-28 DIAGNOSIS — L814 Other melanin hyperpigmentation: Secondary | ICD-10-CM | POA: Diagnosis not present

## 2019-07-28 DIAGNOSIS — C44729 Squamous cell carcinoma of skin of left lower limb, including hip: Secondary | ICD-10-CM | POA: Diagnosis not present

## 2019-07-28 DIAGNOSIS — D0471 Carcinoma in situ of skin of right lower limb, including hip: Secondary | ICD-10-CM | POA: Diagnosis not present

## 2019-07-28 DIAGNOSIS — Z85828 Personal history of other malignant neoplasm of skin: Secondary | ICD-10-CM | POA: Diagnosis not present

## 2019-07-28 DIAGNOSIS — L820 Inflamed seborrheic keratosis: Secondary | ICD-10-CM | POA: Diagnosis not present

## 2019-07-28 DIAGNOSIS — L821 Other seborrheic keratosis: Secondary | ICD-10-CM | POA: Diagnosis not present

## 2019-07-28 DIAGNOSIS — C44722 Squamous cell carcinoma of skin of right lower limb, including hip: Secondary | ICD-10-CM | POA: Diagnosis not present

## 2019-07-28 DIAGNOSIS — D1801 Hemangioma of skin and subcutaneous tissue: Secondary | ICD-10-CM | POA: Diagnosis not present

## 2019-08-15 ENCOUNTER — Other Ambulatory Visit: Payer: Self-pay | Admitting: Medical

## 2019-09-01 ENCOUNTER — Other Ambulatory Visit: Payer: Self-pay | Admitting: Medical

## 2019-09-09 DIAGNOSIS — C44729 Squamous cell carcinoma of skin of left lower limb, including hip: Secondary | ICD-10-CM | POA: Diagnosis not present

## 2019-09-11 ENCOUNTER — Other Ambulatory Visit: Payer: Self-pay

## 2019-09-11 ENCOUNTER — Ambulatory Visit (INDEPENDENT_AMBULATORY_CARE_PROVIDER_SITE_OTHER): Payer: BC Managed Care – PPO | Admitting: Podiatry

## 2019-09-11 ENCOUNTER — Encounter: Payer: Self-pay | Admitting: Podiatry

## 2019-09-11 DIAGNOSIS — L600 Ingrowing nail: Secondary | ICD-10-CM

## 2019-09-11 MED ORDER — NEOMYCIN-POLYMYXIN-HC 3.5-10000-1 OT SOLN: 3.0000 [drp] | Freq: Four times a day (QID) | OTIC | 0 refills | Status: DC

## 2019-09-11 NOTE — Patient Instructions (Signed)

## 2019-09-14 ENCOUNTER — Other Ambulatory Visit: Payer: Self-pay | Admitting: Medical

## 2019-09-14 NOTE — Progress Notes (Signed)
Subjective:   Patient ID: Kristi Avila, female   DOB: 64 y.o.   MRN: [id number]   HPI Patient presents stating she is getting a painful ingrown toenail that is making it hard to wear shoe gear.  States that it is been very sore and that she is tried to trim it and soak it is been present for several months   Review of Systems  All other systems reviewed and are negative.       Objective:  Physical Exam Vitals and nursing note reviewed.  Constitutional:      Appearance: She is well-developed.  Pulmonary:     Effort: Pulmonary effort is normal.  Musculoskeletal:        General: Normal range of motion.  Skin:    General: Skin is warm.  Neurological:     Mental Status: She is alert.     Neurovascular status intact muscle strength adequate range of motion within normal limits.  Patient is found to have an incurvated medial border right hallux that is red with distal irritation but no active drainage noted and it is just sore due to the position of the toenail.  Patient has good digital perfusion well oriented x3     Assessment:  Ingrown toenail deformity right hallux medial border with pain     Plan:  H&P reviewed condition recommended correction.  I explained procedure risk and patient wants surgery and today I went ahead and she signed consent form.  I infiltrated the right hallux 60 mg like Marcaine mixture sterile prep done using sterile instrumentation remove border exposed matrix applied phenol 3 applications 30 seconds of alcohol lavage sterile dressing.  Gave instructions for soaks and leave dressing on 24 hours but take it off early if any throbbing were to occur and patient is encouraged to call with questions concerns

## 2019-09-14 NOTE — Telephone Encounter (Signed)
Please provide labs that you want patient to have prior to her scheduling her physical.

## 2019-09-14 NOTE — Telephone Encounter (Signed)
She is due for fasting physical.  Please schedule.  Send 30-day supply once scheduled

## 2019-09-15 ENCOUNTER — Other Ambulatory Visit: Payer: Self-pay | Admitting: Medical

## 2019-09-15 DIAGNOSIS — Z0000 Encounter for general adult medical examination without abnormal findings: Secondary | ICD-10-CM

## 2019-09-15 DIAGNOSIS — Z131 Encounter for screening for diabetes mellitus: Secondary | ICD-10-CM | POA: Insufficient documentation

## 2019-09-15 DIAGNOSIS — E785 Hyperlipidemia, unspecified: Secondary | ICD-10-CM

## 2019-09-15 DIAGNOSIS — Z113 Encounter for screening for infections with a predominantly sexual mode of transmission: Secondary | ICD-10-CM | POA: Insufficient documentation

## 2019-09-15 NOTE — Telephone Encounter (Signed)
I put orders in.  Put on script for me to sign if needed if not coming in here for labs.   Refill script for #30

## 2019-09-22 ENCOUNTER — Telehealth: Payer: Self-pay | Admitting: Medical

## 2019-09-22 NOTE — Telephone Encounter (Signed)
See her email message.  I gave generic a prescription pad to do the lab order this morning to be faxed where she needed it.  Not sure if this was done

## 2019-09-23 NOTE — Telephone Encounter (Signed)
Lvm to find out from pt if she was able to get labs done. [place]

## 2019-09-24 ENCOUNTER — Telehealth: Payer: Self-pay | Admitting: Family Medicine

## 2019-09-24 NOTE — Telephone Encounter (Signed)
This is why we often will do the visit before we decide on labs as sometimes people have other add-on test they want done.  Please let her know what the status is only lab order, prescription order with lab request  General cancer screenings for females of mammogram, breast exam, Pap smear, colonoscopy.  There is no specific blood tests for cancer screening other than general blood work such as blood counts liver and kidney test.  We can discuss further at that visit

## 2019-09-24 NOTE — Telephone Encounter (Signed)
Pt called and states she doesn't know if lab order was faxed to [place] wants to make sure Cancer screening labs are in with this order since she keeps having skin cancer on her legs.  Please let her know [phone]

## 2019-09-24 NOTE — Telephone Encounter (Signed)
Can you please advise patient request. I have sent the labs you told me to send.

## 2019-09-25 NOTE — Telephone Encounter (Signed)
Spoke to patient. When she schedules her physical she would like to have tdap due to new born coming and a flu shot.

## 2019-09-29 ENCOUNTER — Other Ambulatory Visit: Payer: Self-pay | Admitting: Medical

## 2019-09-29 NOTE — Telephone Encounter (Signed)
[facility]t [facility] 10[facility] . After she get her labs she stated she will make an appt

## 2019-10-07 ENCOUNTER — Other Ambulatory Visit: Payer: Self-pay | Admitting: Medical

## 2019-10-07 DIAGNOSIS — Z113 Encounter for screening for infections with a predominantly sexual mode of transmission: Secondary | ICD-10-CM | POA: Diagnosis not present

## 2019-10-07 DIAGNOSIS — Z0000 Encounter for general adult medical examination without abnormal findings: Secondary | ICD-10-CM | POA: Diagnosis not present

## 2019-10-07 DIAGNOSIS — Z131 Encounter for screening for diabetes mellitus: Secondary | ICD-10-CM | POA: Diagnosis not present

## 2019-10-07 DIAGNOSIS — E785 Hyperlipidemia, unspecified: Secondary | ICD-10-CM | POA: Diagnosis not present

## 2019-10-08 LAB — COMPLETE METABOLIC PANEL WITH GFR
AG Ratio: 1.8 (calc) (ref 1.0–2.5)
ALT: 20 U/L (ref 6–29)
AST: 18 U/L (ref 10–35)
Albumin: 4.5 g/dL (ref 3.6–5.1)
Alkaline phosphatase (APISO): 71 U/L (ref 37–153)
BUN: 18 mg/dL (ref 7–25)
CO2: 30 mmol/L (ref 20–32)
Calcium: 10 mg/dL (ref 8.6–10.4)
Chloride: 103 mmol/L (ref 98–110)
Creat: 0.97 mg/dL (ref 0.50–0.99)
GFR, Est African American: 73 mL/min/{1.73_m2} (ref >=60)
GFR, Est Non African American: 63 mL/min/{1.73_m2} (ref >=60)
Globulin: 2.5 g/dL (calc) (ref 1.9–3.7)
Glucose, Bld: 97 mg/dL (ref 65–99)
Potassium: 4.8 mmol/L (ref 3.5–5.3)
Sodium: 141 mmol/L (ref 135–146)
Total Bilirubin: 0.5 mg/dL (ref 0.2–1.2)
Total Protein: 7 g/dL (ref 6.1–8.1)

## 2019-10-08 LAB — LIPID PANEL
Cholesterol: 219 mg/dL — ABNORMAL HIGH (ref <200)
HDL: 74 mg/dL (ref >=50)
LDL Cholesterol (Calc): 120 mg/dL (calc) — ABNORMAL HIGH
Non-HDL Cholesterol (Calc): 145 mg/dL (calc) — ABNORMAL HIGH (ref <130)
Total CHOL/HDL Ratio: 3 (calc) (ref <5.0)
Triglycerides: 135 mg/dL (ref <150)

## 2019-10-13 ENCOUNTER — Encounter: Payer: Self-pay | Admitting: Medical

## 2019-10-14 ENCOUNTER — Telehealth: Payer: Self-pay | Admitting: Medical

## 2019-10-14 NOTE — Telephone Encounter (Signed)
Pt called and requested a TDAP. She has a new grand baby coming. Please advise pt. Sending back to [place] due to [place] being out of office.

## 2019-10-14 NOTE — Telephone Encounter (Signed)
Ok to give also see if she wants a flu shot

## 2019-10-15 ENCOUNTER — Telehealth: Payer: Self-pay | Admitting: Family Medicine

## 2019-10-15 NOTE — Telephone Encounter (Signed)
Pt was called and advised. Pt has an appt for next Friday.

## 2019-10-15 NOTE — Telephone Encounter (Signed)
Pt called for lab results.  They were scanned under media from [place] her of results and she said she would discuss further with you at next visit.

## 2019-10-15 NOTE — Telephone Encounter (Signed)
Dt  

## 2019-10-16 ENCOUNTER — Other Ambulatory Visit: Payer: BC Managed Care – PPO

## 2019-10-17 ENCOUNTER — Other Ambulatory Visit: Payer: Self-pay | Admitting: Medical

## 2019-10-19 ENCOUNTER — Other Ambulatory Visit: Payer: Self-pay

## 2019-10-19 ENCOUNTER — Telehealth: Payer: Self-pay

## 2019-10-19 NOTE — Telephone Encounter (Signed)
Received fax from [facility] for a refill on the pts. Meloxicam and Ezetimibe pts. Last apt was 03/25/19 and next apt is 11/02/19.

## 2019-10-19 NOTE — Telephone Encounter (Signed)
Please send in 30-day supply.  She is coming in soon for physical

## 2019-10-20 NOTE — Telephone Encounter (Signed)
Done

## 2019-10-23 ENCOUNTER — Other Ambulatory Visit (INDEPENDENT_AMBULATORY_CARE_PROVIDER_SITE_OTHER): Payer: BC Managed Care – PPO

## 2019-10-23 ENCOUNTER — Other Ambulatory Visit: Payer: Self-pay

## 2019-10-23 DIAGNOSIS — Z23 Encounter for immunization: Secondary | ICD-10-CM

## 2019-10-28 DIAGNOSIS — D225 Melanocytic nevi of trunk: Secondary | ICD-10-CM | POA: Diagnosis not present

## 2019-10-28 DIAGNOSIS — L281 Prurigo nodularis: Secondary | ICD-10-CM | POA: Diagnosis not present

## 2019-10-28 DIAGNOSIS — L570 Actinic keratosis: Secondary | ICD-10-CM | POA: Diagnosis not present

## 2019-10-28 DIAGNOSIS — L821 Other seborrheic keratosis: Secondary | ICD-10-CM | POA: Diagnosis not present

## 2019-10-28 DIAGNOSIS — Z85828 Personal history of other malignant neoplasm of skin: Secondary | ICD-10-CM | POA: Diagnosis not present

## 2019-11-02 ENCOUNTER — Other Ambulatory Visit: Payer: Self-pay | Admitting: Medical

## 2019-11-02 ENCOUNTER — Ambulatory Visit (INDEPENDENT_AMBULATORY_CARE_PROVIDER_SITE_OTHER): Payer: BC Managed Care – PPO | Admitting: Medical

## 2019-11-02 ENCOUNTER — Encounter: Payer: Self-pay | Admitting: Medical

## 2019-11-02 ENCOUNTER — Other Ambulatory Visit: Payer: Self-pay

## 2019-11-02 VITALS — BP 120/86 | HR 71 | Ht 66.0 in | Wt 206.6 lb

## 2019-11-02 DIAGNOSIS — Z1211 Encounter for screening for malignant neoplasm of colon: Secondary | ICD-10-CM | POA: Diagnosis not present

## 2019-11-02 DIAGNOSIS — Z780 Asymptomatic menopausal state: Secondary | ICD-10-CM

## 2019-11-02 DIAGNOSIS — K140 Glossitis: Secondary | ICD-10-CM

## 2019-11-02 DIAGNOSIS — N393 Stress incontinence (female) (male): Secondary | ICD-10-CM

## 2019-11-02 DIAGNOSIS — Z139 Encounter for screening, unspecified: Secondary | ICD-10-CM | POA: Diagnosis not present

## 2019-11-02 DIAGNOSIS — M8949 Other hypertrophic osteoarthropathy, multiple sites: Secondary | ICD-10-CM

## 2019-11-02 DIAGNOSIS — K136 Irritative hyperplasia of oral mucosa: Secondary | ICD-10-CM | POA: Diagnosis not present

## 2019-11-02 DIAGNOSIS — Z7185 Encounter for immunization safety counseling: Secondary | ICD-10-CM

## 2019-11-02 DIAGNOSIS — K5900 Constipation, unspecified: Secondary | ICD-10-CM

## 2019-11-02 DIAGNOSIS — K2270 Barrett's esophagus without dysplasia: Secondary | ICD-10-CM

## 2019-11-02 DIAGNOSIS — E669 Obesity, unspecified: Secondary | ICD-10-CM

## 2019-11-02 DIAGNOSIS — Z0000 Encounter for general adult medical examination without abnormal findings: Secondary | ICD-10-CM

## 2019-11-02 DIAGNOSIS — G8929 Other chronic pain: Secondary | ICD-10-CM

## 2019-11-02 DIAGNOSIS — B009 Herpesviral infection, unspecified: Secondary | ICD-10-CM

## 2019-11-02 DIAGNOSIS — M159 Polyosteoarthritis, unspecified: Secondary | ICD-10-CM

## 2019-11-02 DIAGNOSIS — K22719 Barrett's esophagus with dysplasia, unspecified: Secondary | ICD-10-CM

## 2019-11-02 DIAGNOSIS — F419 Anxiety disorder, unspecified: Secondary | ICD-10-CM

## 2019-11-02 DIAGNOSIS — E2839 Other primary ovarian failure: Secondary | ICD-10-CM

## 2019-11-02 DIAGNOSIS — E785 Hyperlipidemia, unspecified: Secondary | ICD-10-CM

## 2019-11-02 DIAGNOSIS — K209 Esophagitis, unspecified without bleeding: Secondary | ICD-10-CM

## 2019-11-02 DIAGNOSIS — Z8262 Family history of osteoporosis: Secondary | ICD-10-CM

## 2019-11-02 DIAGNOSIS — G479 Sleep disorder, unspecified: Secondary | ICD-10-CM

## 2019-11-02 DIAGNOSIS — M150 Primary generalized (osteo)arthritis: Secondary | ICD-10-CM

## 2019-11-02 DIAGNOSIS — M160 Bilateral primary osteoarthritis of hip: Secondary | ICD-10-CM

## 2019-11-02 DIAGNOSIS — K219 Gastro-esophageal reflux disease without esophagitis: Secondary | ICD-10-CM

## 2019-11-02 DIAGNOSIS — F32A Depression, unspecified: Secondary | ICD-10-CM

## 2019-11-02 DIAGNOSIS — G4700 Insomnia, unspecified: Secondary | ICD-10-CM

## 2019-11-02 DIAGNOSIS — N3281 Overactive bladder: Secondary | ICD-10-CM

## 2019-11-02 DIAGNOSIS — M545 Low back pain, unspecified: Secondary | ICD-10-CM

## 2019-11-02 LAB — POCT URINALYSIS DIP (CLINITEK)
Bilirubin, UA: NEGATIVE
Blood, UA: NEGATIVE
Glucose, UA: NEGATIVE mg/dL
Ketones, POC UA: NEGATIVE mg/dL
Leukocytes, UA: NEGATIVE
Nitrite, UA: NEGATIVE
POC PROTEIN,UA: NEGATIVE
Spec Grav, UA: 1.01 (ref 1.010–1.025)
Urobilinogen, UA: 0.2 E.U./dL
pH, UA: 6 (ref 5.0–8.0)

## 2019-11-02 NOTE — Progress Notes (Signed)
Subjective:   HPI  Kristi Avila is a 64 y.o. female who presents for Chief Complaint  Patient presents with  . Annual Exam    had labs sent over     Patient Care Team: Courtni Balash, Camelia Eng, PA-C as PCP - General (Family Medicine) Sees dentist:not currently Sees eye doctor: not currently Dr. Owens Loffler, GI Dr. Maryland Pink, Urology Dr. Ila Mcgill, podiatry Dr. Gaynelle Arabian and Dr. Suella Broad, orthopedics Dermatology  Concerns: Here to review labs that she had done prior to the visit  Here for physical  She notes for several weeks having irritation in the mouth and gums, tongue seems inflamed, only milk soothe her mouth.  There is a raw feeling inside the mouth.  She saw an oral surgeon several months ago for an unrelated issue but there was no signs of oral cancer or abnormal findings in.  She works for a [employer], community to look in her mouth recently and did not see anything specifically wrong   Gynecological history: 5 pregnancies, 0 live births. Patient not having periods, postmenopausal. patient sees gynecology for gynecology care.  She had an updated Pap smear mammogram December 2020 through Dr. Ronita Hipps, mammogram done at [facility].  Has not tolerated numerous statins in the past due to myalgias  Past Medical History:  Diagnosis Date  . Arthritis   . Barrett's esophagus with esophagitis 07/23/12   EGD: CLO test results negative. pathology: Barretss: Repeat EGD 3 years  . Blood transfusion without reported diagnosis   . Depression   . Family history of anesthesia complication    mother trouble waking up  . Frequency of urination   . GERD (gastroesophageal reflux disease)    h/o peptic ulcer  . High cholesterol   . HSV-2 infection    anal  . Nocturia   . Obesity   . Overactive bladder   . Reflux   . Urgency of urination     Family History  Problem Relation Age of Onset  . Arthritis Mother   . Pulmonary fibrosis Father         pulmonary fibrosis  . Pulmonary disease Father   . Cancer Sister        pancreatic  . Diabetes Brother   . Colon cancer Neg Hx   . Esophageal cancer Neg Hx   . Stomach cancer Neg Hx      Current Outpatient Medications:  .  ALPRAZolam (XANAX) 0.5 MG tablet, alprazolam 0.5 mg tablet, Disp: , Rfl:  .  amitriptyline (ELAVIL) 25 MG tablet, , Disp: , Rfl:  .  ciprofloxacin (CIPRO) 250 MG tablet, TAKE 1 TABLET(250 MG) BY MOUTH DAILY FOR UP TO 10 DAYS AS NEEDED, Disp: 30 tablet, Rfl: 0 .  DEXILANT 60 MG capsule, TAKE 1 CAPSULE(60 MG) BY MOUTH DAILY, Disp: 90 capsule, Rfl: 0 .  DULoxetine (CYMBALTA) 60 MG capsule, TAKE 2 CAPSULE BY MOUTH EVERY DAY., Disp: 180 capsule, Rfl: 0 .  estradiol (ESTRACE) 0.1 MG/GM vaginal cream, Place vaginally., Disp: , Rfl:  .  ezetimibe (ZETIA) 10 MG tablet, TAKE 1 TABLET(10 MG) BY MOUTH EVERY MORNING, Disp: 30 tablet, Rfl: 0 .  famotidine (PEPCID) 20 MG tablet, Take 1 tablet (20 mg total) by mouth at bedtime., Disp: 30 tablet, Rfl: 5 .  meloxicam (MOBIC) 15 MG tablet, Take 1 tablet (15 mg total) by mouth daily., Disp: 30 tablet, Rfl: 1 .  OVER THE COUNTER MEDICATION, Vitamin B12, Disp: , Rfl:  .  OVER THE  COUNTER MEDICATION, Vitamin d 3, Disp: , Rfl:  .  TRULANCE 3 MG TABS, TAKE 1 TABLET BY MOUTH DAILY, Disp: 30 tablet, Rfl: 2 .  valACYclovir (VALTREX) 500 MG tablet, TAKE 1 TABLET(500 MG) BY MOUTH DAILY, Disp: 90 tablet, Rfl: 1 .  amoxicillin (AMOXIL) 500 MG tablet, amoxicillin 500 mg tablet  TAKE 4 TABLETS BY MOUTH 1 HOUR BEFORE APPOINTMNET (Patient not taking: Reported on 11/02/2019), Disp: , Rfl:  .  HYDROcodone-acetaminophen (NORCO/VICODIN) 5-325 MG tablet, Take 1 tablet by mouth 2 (two) times daily as needed. (Patient not taking: Reported on 11/02/2019), Disp: , Rfl:  .  linaclotide (LINZESS) 72 MCG capsule, Take 1 capsule (72 mcg total) by mouth daily before breakfast. (Patient not taking: Reported on 11/02/2019), Disp: 30 capsule, Rfl: 2 .  meloxicam (MOBIC)  7.5 MG tablet, TAKE 1 TABLET(7.5 MG) BY MOUTH DAILY (Patient not taking: Reported on 11/02/2019), Disp: 30 tablet, Rfl: 0 .  triamcinolone (KENALOG) 0.1 % paste, Use as directed 1 application in the mouth or throat 2 (two) times daily. (Patient not taking: Reported on 11/02/2019), Disp: 5 g, Rfl: 0  Allergies  Allergen Reactions  . Cephalosporins Diarrhea  . Codeine Itching  . Latex Itching  . Pravastatin     Myalgias  . Simvastatin     myalgias  . Sulfa Antibiotics Diarrhea     Reviewed their medical, surgical, family, social, medication, and allergy history and updated chart as appropriate.   Review of Systems Constitutional: -fever, -chills, -sweats, -unexpected weight change, -decreased appetite, -fatigue Allergy: -sneezing, -itching, -congestion Dermatology: -changing moles, --rash, -lumps ENT: -runny nose, -ear pain, -sore throat, -hoarseness, -sinus pain, -teeth pain, - ringing in ears, -hearing loss, -nosebleeds Cardiology: -chest pain, -palpitations, -swelling, -difficulty breathing when lying flat, -waking up short of breath Respiratory: -cough, -shortness of breath, -difficulty breathing with exercise or exertion, -wheezing, -coughing up blood Gastroenterology: -abdominal pain, -nausea, -vomiting, -diarrhea, +constipation, -blood in stool, -changes in bowel movement, -difficulty swallowing or eating Hematology: -bleeding, -bruising  Musculoskeletal: -joint aches, -muscle aches, -joint swelling, -back pain, -neck pain, -cramping, -changes in gait Ophthalmology: denies vision changes, eye redness, itching, discharge Urology: -burning with urination, -difficulty urinating, -blood in urine, -urinary frequency, -urgency, -incontinence Neurology: -headache, -weakness, -tingling, -numbness, -memory loss, -falls, -dizziness Psychology: -depressed mood, -agitation, -sleep problems Breast/gyn: -breast tendnerss, -discharge, -lumps, -vaginal discharge,- irregular periods, -heavy  periods     Objective:  BP 120/86   Pulse 71   Ht $R'5\' 6"'nT$  (1.676 m)   Wt 206 lb 9.6 oz (93.7 kg)   SpO2 95%   BMI 33.35 kg/m   Wt Readings from Last 3 Encounters:  11/02/19 206 lb 9.6 oz (93.7 kg)  03/25/19 205 lb 6.4 oz (93.2 kg)  11/07/18 208 lb 3.2 oz (94.4 kg)    General appearance: alert, no distress, WD/WN, Caucasian female, in clothes, not in a gown Skin: Several recent biopsy healing wounds on bilateral arms from dermatology visit HEENT: normocephalic, conjunctiva/corneas normal, sclerae anicteric, PERRLA, EOMi, nares patent, no discharge or erythema, pharynx normal Oral cavity: MMM, tongue normal, teeth in good repair Neck: supple, no lymphadenopathy, no thyromegaly, no masses, normal ROM, no bruits Chest: non tender, normal shape and expansion Heart: RRR, normal S1, S2, no murmurs Lungs: CTA bilaterally, no wheezes, rhonchi, or rales Abdomen: +bs, soft, non tender, non distended, no masses, no hepatomegaly, no splenomegaly, no bruits Back: non tender, normal ROM, no scoliosis Musculoskeletal: upper extremities non tender, no obvious deformity, normal ROM throughout, lower extremities non tender,  no obvious deformity, normal ROM throughout Extremities: no edema, no cyanosis, no clubbing Pulses: 2+ symmetric, upper and lower extremities, normal cap refill Neurological: alert, oriented x 3, CN2-12 intact, strength normal upper extremities and lower extremities, sensation normal throughout, DTRs 2+ throughout, no cerebellar signs, gait normal Psychiatric: normal affect, behavior normal, pleasant  Breast/gyn/rectal - deferred to gynecology    Assessment and Plan :   Encounter Diagnoses  Name Primary?  . Encounter for health maintenance examination in adult Yes  . Irritation of oral cavity   . Screening for condition   . Screening for colon cancer   . Sleep disturbance   . Barrett's esophagus with dysplasia   . Barrett's esophagus with esophagitis   .  Gastroesophageal reflux disease, unspecified whether esophagitis present   . Tongue inflammation   . Primary osteoarthritis of both hips   . Primary osteoarthritis involving multiple joints   . Overactive bladder   . Anxiety and depression   . Chronic low back pain, unspecified back pain laterality, unspecified whether sciatica present   . Constipation, unspecified constipation type   . Dyslipidemia   . Family history of osteoporosis   . Estrogen deficiency   . Vaccine counseling   . Stress incontinence   . Post-menopausal   . Obesity with serious comorbidity, unspecified classification, unspecified obesity type   . Hyperlipidemia, unspecified hyperlipidemia type   . HSV-2 infection   . Insomnia, unspecified type      Plan: Physical exam - discussed and counseled on healthy lifestyle, diet, exercise, preventative care, vaccinations, sick and well care, proper use of emergency dept and after hours care, and addressed their concerns.    Health screening: Advised they see their eye doctor yearly for routine vision care. Advised they see their dentist yearly for routine dental care including hygiene visits twice yearly. Continue routine follow-up with gynecology  We reviewed recent labs she had done at [facility] prior to coming in for appointment  Cancer screening Counseled on self breast exams, mammograms, cervical cancer screening  Colonoscopy:  Reviewed colonoscopy on file that is up to date from 2017 Given stool cards kit to return for hemoccult screening  We will request recent mammogram and Pap smear from [facility] and gynecology respectively  She continues routine follow-up with dermatology due to history of skin cancer and recent biopsies    Vaccinations: Advised yearly influenza vaccine She is up-to-date on Covid vaccine and booster, tetanus booster, Shingrix, and yearly flu shot   Separate significant issues discussed: Mouth irritation-unclear etiology.  Discussed  possible causes.  Additional labs as below today.  Avoid acidic foods, increase water intake.  Dyslipidemia-history of statin intolerance to 3 statins, continues on Zetia.  Advanced cardiovascular risk score 3.2% 10-year risk.  Continue Zetia.  Reviewed recent lipids  Stress incontinence, overactive bladder-recently established with urology for eval and treatment.  On amitriptyline to help with overactive bladder symptoms.  Formerly was on Flomax and other medications  History of HSV-2 infection-on Valtrex for prevention and flareup as needed  Insomnia, sleep disturbance -using amitriptyline as well as Xanax to help with sleep.  We discussed other potential options such as trazodone.  We discussed the addictive nature of Xanax  Osteoarthritis-managed by orthopedics currently on meloxicam  History of skin cancer-continue routine follow-up with dermatology  Chronic constipation-continue good fiber intake, currently doing pretty well on Trulance  GERD history of Barrett's esophagus-continue Dexilant ,famotidine  Anxiety and depression-doing okay on current regimen of Cymbalta  Postmenopausal-on hormone therapy per  gynecology  Eller was seen today for annual exam.  Diagnoses and all orders for this visit:  Encounter for health maintenance examination in adult -     Sedimentation rate -     Rheumatoid factor -     ANA -     POCT URINALYSIS DIP (CLINITEK)  Irritation of oral cavity -     Sedimentation rate -     Rheumatoid factor -     ANA  Screening for condition -     Sedimentation rate -     Rheumatoid factor -     ANA  Screening for colon cancer -     Fecal occult blood, imunochemical([facility])  Sleep disturbance  Barrett's esophagus with dysplasia  Barrett's esophagus with esophagitis  Gastroesophageal reflux disease, unspecified whether esophagitis present  Tongue inflammation  Primary osteoarthritis of both hips  Primary osteoarthritis involving  multiple joints  Overactive bladder  Anxiety and depression  Chronic low back pain, unspecified back pain laterality, unspecified whether sciatica present  Constipation, unspecified constipation type  Dyslipidemia  Family history of osteoporosis  Estrogen deficiency  Vaccine counseling  Stress incontinence  Post-menopausal  Obesity with serious comorbidity, unspecified classification, unspecified obesity type  Hyperlipidemia, unspecified hyperlipidemia type  HSV-2 infection  Insomnia, unspecified type    Follow-up pending labs, yearly for physical

## 2019-11-03 ENCOUNTER — Other Ambulatory Visit: Payer: Self-pay | Admitting: Medical

## 2019-11-03 DIAGNOSIS — R102 Pelvic and perineal pain: Secondary | ICD-10-CM

## 2019-11-03 DIAGNOSIS — R109 Unspecified abdominal pain: Secondary | ICD-10-CM

## 2019-11-03 DIAGNOSIS — Z780 Asymptomatic menopausal state: Secondary | ICD-10-CM

## 2019-11-03 LAB — RHEUMATOID FACTOR: Rheumatoid fact SerPl-aCnc: <10 IU/mL (ref 0.0–13.9)

## 2019-11-03 LAB — ANA: Anti Nuclear Antibody (ANA): NEGATIVE

## 2019-11-03 LAB — SEDIMENTATION RATE: Sed Rate: 3 mm/hr (ref 0–40)

## 2019-11-09 ENCOUNTER — Telehealth: Payer: Self-pay | Admitting: Medical

## 2019-11-09 DIAGNOSIS — M7062 Trochanteric bursitis, left hip: Secondary | ICD-10-CM | POA: Diagnosis not present

## 2019-11-09 NOTE — Telephone Encounter (Signed)
Received requested records from [facility]

## 2019-11-10 ENCOUNTER — Telehealth: Payer: Self-pay | Admitting: Medical

## 2019-11-10 NOTE — Telephone Encounter (Signed)
Received requested records from [facility] 

## 2019-11-11 DIAGNOSIS — M7062 Trochanteric bursitis, left hip: Secondary | ICD-10-CM | POA: Diagnosis not present

## 2019-11-13 ENCOUNTER — Other Ambulatory Visit: Payer: Self-pay | Admitting: Medical

## 2019-11-16 ENCOUNTER — Other Ambulatory Visit: Payer: Self-pay | Admitting: Medical

## 2019-11-19 ENCOUNTER — Other Ambulatory Visit: Payer: BC Managed Care – PPO

## 2019-11-19 DIAGNOSIS — M7062 Trochanteric bursitis, left hip: Secondary | ICD-10-CM | POA: Diagnosis not present

## 2019-11-20 ENCOUNTER — Encounter: Payer: Self-pay | Admitting: Medical

## 2019-11-24 ENCOUNTER — Telehealth: Payer: Self-pay | Admitting: Medical

## 2019-11-24 ENCOUNTER — Encounter: Payer: Self-pay | Admitting: Medical

## 2019-11-24 NOTE — Telephone Encounter (Signed)
Insurance is declining coverage on the CT.  We may have to start with having her see gynecology if still having pelvic pains or lower abdominal pains.   Or refer back to gastroenterology if more upper abdominal pain, worsening GERD or other.

## 2019-11-24 NOTE — Telephone Encounter (Signed)
Message has been sent to patient via mychart and waiting for response to see how she would like to proceed.

## 2019-11-26 DIAGNOSIS — M7062 Trochanteric bursitis, left hip: Secondary | ICD-10-CM | POA: Diagnosis not present

## 2019-11-29 ENCOUNTER — Other Ambulatory Visit: Payer: Self-pay | Admitting: Medical

## 2019-11-30 DIAGNOSIS — L570 Actinic keratosis: Secondary | ICD-10-CM | POA: Diagnosis not present

## 2019-11-30 DIAGNOSIS — D0462 Carcinoma in situ of skin of left upper limb, including shoulder: Secondary | ICD-10-CM | POA: Diagnosis not present

## 2019-12-01 ENCOUNTER — Telehealth: Payer: Self-pay | Admitting: Medical

## 2019-12-01 NOTE — Telephone Encounter (Signed)
I called twice now to get through to the appeals line with no luck.   I left a message  [employer] is very hard to deal with it very hard to get through on phone

## 2019-12-01 NOTE — Telephone Encounter (Signed)
Patient updated.

## 2019-12-03 ENCOUNTER — Encounter: Payer: Self-pay | Admitting: Medical

## 2019-12-04 DIAGNOSIS — R87612 Low grade squamous intraepithelial lesion on cytologic smear of cervix (LGSIL): Secondary | ICD-10-CM | POA: Diagnosis not present

## 2019-12-04 DIAGNOSIS — Z01419 Encounter for gynecological examination (general) (routine) without abnormal findings: Secondary | ICD-10-CM | POA: Diagnosis not present

## 2019-12-11 DIAGNOSIS — R1032 Left lower quadrant pain: Secondary | ICD-10-CM | POA: Diagnosis not present

## 2019-12-13 DIAGNOSIS — Z1211 Encounter for screening for malignant neoplasm of colon: Secondary | ICD-10-CM | POA: Diagnosis not present

## 2019-12-14 ENCOUNTER — Telehealth: Payer: Self-pay | Admitting: Medical

## 2019-12-14 NOTE — Telephone Encounter (Signed)
Please call patient.  Despite trying to get her CT scan ordered, and despite phone calls to [employer], a letter reply by me as well to [employer], her insurer still will not approve the CT scan.  I have had lots of problems with [employer] which puts up roadblocks to getting scans approved.  I personally feel like [employer] misrepresented the data we gave them.  Nevertheless continue plans for referral to specialist.

## 2019-12-15 NOTE — Telephone Encounter (Signed)
Message has been sent to patient via mychart  

## 2019-12-16 ENCOUNTER — Other Ambulatory Visit: Payer: Self-pay | Admitting: Medical

## 2019-12-17 ENCOUNTER — Other Ambulatory Visit: Payer: Self-pay

## 2019-12-18 MED ORDER — DULOXETINE HCL 60 MG PO CPEP: 60.0000 mg | ORAL_CAPSULE | Freq: Two times a day (BID) | ORAL | 0 refills | Status: DC

## 2019-12-18 NOTE — Telephone Encounter (Signed)
Called pt to advise only the abx was denied. No answer LVM. [place]

## 2019-12-23 DIAGNOSIS — M542 Cervicalgia: Secondary | ICD-10-CM | POA: Diagnosis not present

## 2019-12-25 LAB — FECAL OCCULT BLOOD, IMMUNOCHEMICAL: Fecal Occult Bld: NEGATIVE

## 2019-12-31 ENCOUNTER — Other Ambulatory Visit: Payer: Self-pay

## 2019-12-31 ENCOUNTER — Ambulatory Visit
Admission: RE | Admit: 2019-12-31 | Discharge: 2019-12-31 | Disposition: A | Discharge status: Home / Self Care | Payer: BC Managed Care – PPO | Source: Ambulatory Visit | Attending: Emergency Medicine | Admitting: Emergency Medicine

## 2019-12-31 ENCOUNTER — Emergency Department (HOSPITAL_COMMUNITY): Payer: BC Managed Care – PPO

## 2019-12-31 ENCOUNTER — Emergency Department (INDEPENDENT_AMBULATORY_CARE_PROVIDER_SITE_OTHER)
Admission: EM | Admit: 2019-12-31 | Discharge: 2019-12-31 | Discharge status: Against Medical Advice | Payer: BC Managed Care – PPO | Source: Home / Self Care

## 2019-12-31 ENCOUNTER — Telehealth: Payer: Self-pay | Admitting: Medical

## 2019-12-31 ENCOUNTER — Emergency Department (HOSPITAL_COMMUNITY)
Admission: EM | Admit: 2019-12-31 | Discharge: 2019-12-31 | Disposition: A | Discharge status: Home / Self Care | Payer: BC Managed Care – PPO | Attending: Emergency Medicine | Admitting: Emergency Medicine

## 2019-12-31 VITALS — BP 118/81 | HR 73 | Temp 98.1°F | Resp 18

## 2019-12-31 DIAGNOSIS — Z91040 Latex allergy status: Secondary | ICD-10-CM | POA: Insufficient documentation

## 2019-12-31 DIAGNOSIS — R109 Unspecified abdominal pain: Secondary | ICD-10-CM | POA: Diagnosis not present

## 2019-12-31 DIAGNOSIS — K8050 Calculus of bile duct without cholangitis or cholecystitis without obstruction: Secondary | ICD-10-CM | POA: Diagnosis not present

## 2019-12-31 DIAGNOSIS — R102 Pelvic and perineal pain: Secondary | ICD-10-CM

## 2019-12-31 DIAGNOSIS — K219 Gastro-esophageal reflux disease without esophagitis: Secondary | ICD-10-CM | POA: Diagnosis not present

## 2019-12-31 DIAGNOSIS — R1084 Generalized abdominal pain: Secondary | ICD-10-CM | POA: Insufficient documentation

## 2019-12-31 DIAGNOSIS — R140 Abdominal distension (gaseous): Secondary | ICD-10-CM | POA: Diagnosis not present

## 2019-12-31 LAB — URINALYSIS, ROUTINE W REFLEX MICROSCOPIC
Bilirubin Urine: NEGATIVE
Glucose, UA: NEGATIVE mg/dL
Hgb urine dipstick: NEGATIVE
Ketones, ur: NEGATIVE mg/dL
Leukocytes,Ua: NEGATIVE
Nitrite: NEGATIVE
Protein, ur: NEGATIVE mg/dL
Specific Gravity, Urine: 1.004 — ABNORMAL LOW (ref 1.005–1.030)
pH: 7 (ref 5.0–8.0)

## 2019-12-31 LAB — POCT URINALYSIS DIP (MANUAL ENTRY)
Bilirubin, UA: NEGATIVE
Blood, UA: NEGATIVE
Glucose, UA: NEGATIVE mg/dL
Ketones, POC UA: NEGATIVE mg/dL
Leukocytes, UA: NEGATIVE
Nitrite, UA: NEGATIVE
Protein Ur, POC: NEGATIVE mg/dL
Spec Grav, UA: 1.01 (ref 1.010–1.025)
Urobilinogen, UA: 0.2 E.U./dL
pH, UA: 7 (ref 5.0–8.0)

## 2019-12-31 LAB — COMPREHENSIVE METABOLIC PANEL
ALT: 30 U/L (ref 0–44)
AST: 20 U/L (ref 15–41)
Albumin: 4.3 g/dL (ref 3.5–5.0)
Alkaline Phosphatase: 66 U/L (ref 38–126)
Anion gap: 9 (ref 5–15)
BUN: 18 mg/dL (ref 8–23)
CO2: 26 mmol/L (ref 22–32)
Calcium: 9.4 mg/dL (ref 8.9–10.3)
Chloride: 103 mmol/L (ref 98–111)
Creatinine, Ser: 0.87 mg/dL (ref 0.44–1.00)
GFR, Estimated: >60 mL/min (ref >60)
Glucose, Bld: 99 mg/dL (ref 70–99)
Potassium: 4.1 mmol/L (ref 3.5–5.1)
Sodium: 138 mmol/L (ref 135–145)
Total Bilirubin: 0.4 mg/dL (ref 0.3–1.2)
Total Protein: 7 g/dL (ref 6.5–8.1)

## 2019-12-31 LAB — CBC
HCT: 39.4 % (ref 36.0–46.0)
Hemoglobin: 12.7 g/dL (ref 12.0–15.0)
MCH: 30.5 pg (ref 26.0–34.0)
MCHC: 32.2 g/dL (ref 30.0–36.0)
MCV: 94.7 fL (ref 80.0–100.0)
Platelets: 338 10*3/uL (ref 150–400)
RBC: 4.16 MIL/uL (ref 3.87–5.11)
RDW: 12.7 % (ref 11.5–15.5)
WBC: 8.9 10*3/uL (ref 4.0–10.5)
nRBC: 0 % (ref 0.0–0.2)

## 2019-12-31 LAB — LIPASE, BLOOD: Lipase: 47 U/L (ref 11–51)

## 2019-12-31 MED ORDER — DICYCLOMINE HCL 20 MG PO TABS: 20.0000 mg | ORAL_TABLET | Freq: Three times a day (TID) | ORAL | 0 refills | Status: DC | PRN

## 2019-12-31 MED ORDER — IOHEXOL 300 MG/ML  SOLN
100.0000 mL | Freq: Once | INTRAMUSCULAR | Status: AC | PRN
  Administered 2019-12-31: 100 mL via INTRAVENOUS

## 2019-12-31 NOTE — ED Provider Notes (Signed)
[facility]    CSN: [id number] Arrival date & time: 12/31/19  1400      History   Chief Complaint Chief Complaint  Patient presents with  . Abdominal Pain    Patient states abdominal pain for about 3 weeks.    HPI Kristi Avila is a 64 y.o. female history of GERD, Barrett's esophagus, presenting today for evaluation of abdominal pain.  Patient reports that she has had pain for approximately 3 to 4 weeks.  Feels like an ovarian cyst.  Pain is in bilateral lower quadrants with associated nausea.  Has seen OB/GYN and had a Pap smear along with ultrasound which was unremarkable.  Does report that OB/GYN said there is limited visualization of left ovary.  Has had history of cysts remotely.  She denies any urinary symptoms of dysuria, frequency or urgency.  Denies hematuria.  Denies any abnormal vaginal discharge or bleeding.  Feels a lot of pressure in her lower abdomen and pelvis.  She has been eating and drinking normally without affecting pain.  Has had daily bowel movements and is using Metamucil regularly.   HPI  Past Medical History:  Diagnosis Date  . Arthritis   . Barrett's esophagus with esophagitis 07/23/12   EGD: CLO test results negative. pathology: Barretss: Repeat EGD 3 years  . Blood transfusion without reported diagnosis   . Depression   . Family history of anesthesia complication    mother trouble waking up  . Frequency of urination   . GERD (gastroesophageal reflux disease)    h/o peptic ulcer  . High cholesterol   . HSV-2 infection    anal  . Nocturia   . Obesity   . Overactive bladder   . Reflux   . Urgency of urination     Patient Active Problem List   Diagnosis Date Noted  . Irritation of oral cavity 11/02/2019  . Screening for condition 11/02/2019  . Sleep disturbance 11/02/2019  . Screening for colon cancer 11/02/2019  . Screen for STD (sexually transmitted disease) 09/15/2019  . Screening for diabetes mellitus 09/15/2019  . Chronic  low back pain 03/25/2019  . Tongue inflammation 03/25/2019  . Stress incontinence 11/07/2018  . Need for influenza vaccination 09/08/2018  . Vaccine counseling 09/08/2018  . Family history of osteoporosis 09/08/2018  . Estrogen deficiency 09/08/2018  . Post-menopausal 09/08/2018  . Caregiver burden 08/18/2018  . Encounter for health maintenance examination in adult 08/07/2018  . Barrett's esophagus with dysplasia 03/07/2018  . Insomnia 03/07/2018  . Barrett's esophagus with esophagitis   . Osteoarthritis 09/22/2012  . Osteoarthritis of multiple joints 09/12/2012  . HSV-2 infection   . Hyperlipidemia   . Overactive bladder   . Obesity   . Anxiety and depression   . GERD (gastroesophageal reflux disease)     Past Surgical History:  Procedure Laterality Date  . ARTHROSCOPY KNEE W/ DRILLING     X4 ON RT KNEE  . CARPAL TUNNEL RELEASE Right 2000  . colonscopy  2007, 2017  . ESOPHAGOGASTRODUODENOSCOPY  2007   Dr. Ferdinand Lango, [place]  . foot fibroma surgery  all before 1985   x 3  . HEMORRHOID SURGERY  04/2006  . HIP FRACTURE SURGERY Right   . KNEE ARTHROSCOPY     X2 LEFT KNEE  . SINUS EXPLORATION  1995  . TOE AMPUTATION  1959  . TOTAL KNEE ARTHROPLASTY Right 09/22/2012   Procedure: RIGHT TOTAL KNEE ARTHROPLASTY;  Surgeon: [name], MD;  Location: [facility];  Service: Orthopedics;  Laterality: Right;  . TOTAL KNEE ARTHROPLASTY Left 09/28/2013   Procedure: LEFT TOTAL KNEE ARTHROPLASTY, CORTISONE INJECTION RIGHT HIP;  Surgeon: [name], MD;  Location: [facility];  Service: Orthopedics;  Laterality: Left;    OB History   No obstetric history on file.      Home Medications    Prior to Admission medications   Medication Sig Start Date End Date Taking? Authorizing Provider  ALPRAZolam Duanne Moron) 0.5 MG tablet alprazolam 0.5 mg tablet    [provider]  amitriptyline (ELAVIL) 25 MG tablet  08/20/19   [provider]  ciprofloxacin (CIPRO) 250 MG tablet  TAKE 1 TABLET(250 MG) BY MOUTH DAILY FOR UP TO 10 DAYS AS NEEDED 09/30/19   Tysinger, Camelia Eng, PA-C  DEXILANT 60 MG capsule TAKE 1 CAPSULE(60 MG) BY MOUTH DAILY 11/16/19   Tysinger, Camelia Eng, PA-C  DULoxetine (CYMBALTA) 60 MG capsule Take 1 capsule (60 mg total) by mouth 2 (two) times daily. 12/18/19   Tysinger, Camelia Eng, PA-C  estradiol (ESTRACE) 0.1 MG/GM vaginal cream Place vaginally. 07/22/19   [provider]  ezetimibe (ZETIA) 10 MG tablet TAKE 1 TABLET(10 MG) BY MOUTH EVERY MORNING 12/16/19   Tysinger, Camelia Eng, PA-C  famotidine (PEPCID) 20 MG tablet Take 1 tablet (20 mg total) by mouth at bedtime. 03/19/18   Willia Craze, NP  meloxicam (MOBIC) 15 MG tablet TAKE 1 TABLET(15 MG) BY MOUTH DAILY 11/13/19   Tysinger, Camelia Eng, PA-C  OVER THE COUNTER MEDICATION Vitamin B12    [provider]  OVER THE COUNTER MEDICATION Vitamin d 3    [provider]  TRULANCE 3 MG TABS TAKE 1 TABLET BY MOUTH DAILY 11/30/19   Tysinger, Camelia Eng, PA-C  valACYclovir (VALTREX) 500 MG tablet TAKE 1 TABLET(500 MG) BY MOUTH DAILY 11/02/19   Tysinger, Camelia Eng, PA-C    Family History Family History  Problem Relation Age of Onset  . Arthritis Mother   . Pulmonary fibrosis Father        pulmonary fibrosis  . Pulmonary disease Father   . Cancer Sister        pancreatic  . Diabetes Brother   . Colon cancer Neg Hx   . Esophageal cancer Neg Hx   . Stomach cancer Neg Hx     Social History Social History   Tobacco Use  . Smoking status: Never Smoker  . Smokeless tobacco: Never Used  Vaping Use  . Vaping Use: Never used  Substance Use Topics  . Alcohol use: Yes    Alcohol/week: 6.0 standard drinks    Types: 6 Cans of beer per week    Comment: 6  . Drug use: No     Allergies   Cephalosporins, Codeine, Latex, Pravastatin, Simvastatin, and Sulfa antibiotics   Review of Systems Review of Systems  Constitutional: Negative for fever.  Respiratory: Negative for shortness of breath.    Cardiovascular: Negative for chest pain.  Gastrointestinal: Positive for abdominal pain and nausea. Negative for diarrhea and vomiting.  Genitourinary: Negative for dysuria, flank pain, genital sores, hematuria, menstrual problem, vaginal bleeding, vaginal discharge and vaginal pain.  Musculoskeletal: Negative for back pain.  Skin: Negative for rash.  Neurological: Negative for dizziness, light-headedness and headaches.     Physical Exam Triage Vital Signs ED Triage Vitals  Enc Vitals Group     BP 12/31/19 1415 118/81     Pulse Rate 12/31/19 1415 73     Resp 12/31/19 1415 18  Temp 12/31/19 1415 98.1 F (36.7 C)     Temp Source 12/31/19 1415 Oral     SpO2 12/31/19 1415 99 %     Weight --      Height --      Head Circumference --      Peak Flow --      Pain Score 12/31/19 1417 5     Pain Loc --      Pain Edu? --      Excl. in [place]? --    No data found.  Updated Vital Signs BP 118/81 (BP Location: Left Arm)   Pulse 73   Temp 98.1 F (36.7 C) (Oral)   Resp 18   SpO2 99%   Visual Acuity Right Eye Distance:   Left Eye Distance:   Bilateral Distance:    Right Eye Near:   Left Eye Near:    Bilateral Near:     Physical Exam Vitals and nursing note reviewed.  Constitutional:      Appearance: She is well-developed and well-nourished.     Comments: No acute distress  HENT:     Head: Normocephalic and atraumatic.     Nose: Nose normal.  Eyes:     Conjunctiva/sclera: Conjunctivae normal.  Cardiovascular:     Rate and Rhythm: Normal rate.  Pulmonary:     Effort: Pulmonary effort is normal. No respiratory distress.  Abdominal:     General: There is no distension.     Comments: Diffuse tenderness throughout entire abdomen, more focal to bilateral lower quadrants, patient is sensitive to palpation, but negative rebound, negative Rovsing, negative McBurney's negative Murphy's  Musculoskeletal:        General: Normal range of motion.     Cervical back: Neck supple.   Skin:    General: Skin is warm and dry.  Neurological:     Mental Status: She is alert and oriented to person, place, and time.  Psychiatric:        Mood and Affect: Mood and affect normal.      UC Treatments / Results  Labs (all labs ordered are listed, but only abnormal results are displayed) Labs Reviewed  POCT URINALYSIS DIP (MANUAL ENTRY)    EKG   Radiology No results found.  Procedures Procedures (including critical care time)  Medications Ordered in UC Medications - No data to display  Initial Impression / Assessment and Plan / UC Course  I have reviewed the triage vital signs and the nursing notes.  Pertinent labs & imaging results that were available during my care of the patient were reviewed by me and considered in my medical decision making (see chart for details).     Discussed with patient and worsening pelvic pain over the past month.  Has previously seen OB/GYN.  Likely patient will benefit from further imaging such as CT scan which is unavailable at urgent care.  Urine unremarkable without any hematuria or signs of infection.  Discussed options and opted for further evaluation at alternative urgent care with CT imaging capability.  Discussed strict return precautions. Patient verbalized understanding and is agreeable with plan.  Final Clinical Impressions(s) / UC Diagnoses   Final diagnoses:  Pelvic pain   Discharge Instructions   None    ED Prescriptions    None     PDMP not reviewed this encounter.   Janith Lima, [place] 12/31/19 [phone]

## 2019-12-31 NOTE — Telephone Encounter (Signed)
Pt called and states that she continues to be in pain. She states "she feels like her stomach is going to fall out". She states she would really like to get something taken care of soon. Please advise pt at [phone].

## 2019-12-31 NOTE — ED Provider Notes (Signed)
[facility] DEPT Provider Note   CSN: [id number] Arrival date & time: 12/31/19  1623     History Chief Complaint  Patient presents with  . Abdominal Pain    Kristi Avila is a 64 y.o. female.  The history is provided by the patient and medical records.  Abdominal Pain  Kristi Avila is a 64 y.o. female who presents to the Emergency Department complaining of abdominal pain.  Pain has been present since Nov 9.  She saw her PCP and OBGYN previously for this pain.  A CT scan was ordered but he was having difficulty with insurance authorization.  She had a pelvic exam and [place] by OBGYN.    Pain is located in the lower abdomen/suprapubic area, lower back.  Pain is excruciating, like a bowling ball is sitting on her stomach.  Pain radiates to her epigastric region.  She has shooting pains on bilateral flanks.  Pain is constant.  No change with movement, meals.    Denies fever, vomiting, diarrhea, constipation, dysuria.  Has nausea, sob due to abdominal bloating.       Past Medical History:  Diagnosis Date  . Arthritis   . Barrett's esophagus with esophagitis 07/23/12   EGD: CLO test results negative. pathology: Barretss: Repeat EGD 3 years  . Blood transfusion without reported diagnosis   . Depression   . Family history of anesthesia complication    mother trouble waking up  . Frequency of urination   . GERD (gastroesophageal reflux disease)    h/o peptic ulcer  . High cholesterol   . HSV-2 infection    anal  . Nocturia   . Obesity   . Overactive bladder   . Reflux   . Urgency of urination     Patient Active Problem List   Diagnosis Date Noted  . Irritation of oral cavity 11/02/2019  . Screening for condition 11/02/2019  . Sleep disturbance 11/02/2019  . Screening for colon cancer 11/02/2019  . Screen for STD (sexually transmitted disease) 09/15/2019  . Screening for diabetes mellitus 09/15/2019  . Chronic low back pain 03/25/2019  .  Tongue inflammation 03/25/2019  . Stress incontinence 11/07/2018  . Need for influenza vaccination 09/08/2018  . Vaccine counseling 09/08/2018  . Family history of osteoporosis 09/08/2018  . Estrogen deficiency 09/08/2018  . Post-menopausal 09/08/2018  . Caregiver burden 08/18/2018  . Encounter for health maintenance examination in adult 08/07/2018  . Barrett's esophagus with dysplasia 03/07/2018  . Insomnia 03/07/2018  . Barrett's esophagus with esophagitis   . Osteoarthritis 09/22/2012  . Osteoarthritis of multiple joints 09/12/2012  . HSV-2 infection   . Hyperlipidemia   . Overactive bladder   . Obesity   . Anxiety and depression   . GERD (gastroesophageal reflux disease)     Past Surgical History:  Procedure Laterality Date  . ARTHROSCOPY KNEE W/ DRILLING     X4 ON RT KNEE  . CARPAL TUNNEL RELEASE Right 2000  . colonscopy  2007, 2017  . ESOPHAGOGASTRODUODENOSCOPY  2007   Dr. Ferdinand Lango, [place]  . foot fibroma surgery  all before 1985   x 3  . HEMORRHOID SURGERY  04/2006  . HIP FRACTURE SURGERY Right   . KNEE ARTHROSCOPY     X2 LEFT KNEE  . SINUS EXPLORATION  1995  . TOE AMPUTATION  1959  . TOTAL KNEE ARTHROPLASTY Right 09/22/2012   Procedure: RIGHT TOTAL KNEE ARTHROPLASTY;  Surgeon: [name], MD;  Location: [facility];  Service: Orthopedics;  Laterality: Right;  . TOTAL KNEE ARTHROPLASTY Left 09/28/2013   Procedure: LEFT TOTAL KNEE ARTHROPLASTY, CORTISONE INJECTION RIGHT HIP;  Surgeon: [name], MD;  Location: [facility];  Service: Orthopedics;  Laterality: Left;     OB History   No obstetric history on file.     Family History  Problem Relation Age of Onset  . Arthritis Mother   . Pulmonary fibrosis Father        pulmonary fibrosis  . Pulmonary disease Father   . Cancer Sister        pancreatic  . Diabetes Brother   . Colon cancer Neg Hx   . Esophageal cancer Neg Hx   . Stomach cancer Neg Hx     Social History   Tobacco Use  . Smoking  status: Never Smoker  . Smokeless tobacco: Never Used  Vaping Use  . Vaping Use: Never used  Substance Use Topics  . Alcohol use: Yes    Alcohol/week: 6.0 standard drinks    Types: 6 Cans of beer per week    Comment: 6  . Drug use: No    Home Medications Prior to Admission medications   Medication Sig Start Date End Date Taking? Authorizing Provider  ALPRAZolam Duanne Moron) 0.5 MG tablet Take 0.25-0.5 mg by mouth at bedtime.   Yes [provider]  cholecalciferol (VITAMIN D3) 25 MCG (1000 UNIT) tablet Take 1,000 Units by mouth daily.   Yes [provider]  Cyanocobalamin (VITAMIN B 12 PO) Take 1 tablet by mouth daily.   Yes [provider]  DEXILANT 60 MG capsule TAKE 1 CAPSULE(60 MG) BY MOUTH DAILY Patient taking differently: Take 60 mg by mouth daily. 11/16/19  Yes Tysinger, Camelia Eng, PA-C  DULoxetine (CYMBALTA) 60 MG capsule Take 1 capsule (60 mg total) by mouth 2 (two) times daily. Patient taking differently: Take 120 mg by mouth daily. 12/18/19  Yes Tysinger, Camelia Eng, PA-C  ezetimibe (ZETIA) 10 MG tablet TAKE 1 TABLET(10 MG) BY MOUTH EVERY MORNING Patient taking differently: Take 10 mg by mouth daily. 12/16/19  Yes Tysinger, Camelia Eng, PA-C  famotidine (PEPCID) 20 MG tablet Take 1 tablet (20 mg total) by mouth at bedtime. 03/19/18  Yes Willia Craze, NP  guaiFENesin (MUCINEX) 600 MG 12 hr tablet Take 600 mg by mouth daily as needed.   Yes [provider]  meloxicam (MOBIC) 15 MG tablet TAKE 1 TABLET(15 MG) BY MOUTH DAILY Patient taking differently: Take 15 mg by mouth daily. 11/13/19  Yes Tysinger, Camelia Eng, PA-C  TRULANCE 3 MG TABS TAKE 1 TABLET BY MOUTH DAILY Patient taking differently: Take 3 mg by mouth daily. 11/30/19  Yes Tysinger, Camelia Eng, PA-C  valACYclovir (VALTREX) 500 MG tablet TAKE 1 TABLET(500 MG) BY MOUTH DAILY Patient taking differently: Take 500 mg by mouth daily. 11/02/19  Yes Tysinger, Camelia Eng, PA-C  ciprofloxacin (CIPRO) 250 MG tablet  TAKE 1 TABLET(250 MG) BY MOUTH DAILY FOR UP TO 10 DAYS AS NEEDED Patient not taking: No sig reported 09/30/19   Tysinger, Camelia Eng, PA-C  dicyclomine (BENTYL) 20 MG tablet Take 1 tablet (20 mg total) by mouth 3 (three) times daily as needed for spasms. 12/31/19   Quintella Reichert, MD    Allergies    Cephalosporins, Codeine, Latex, Pravastatin, Simvastatin, and Sulfa antibiotics  Review of Systems   Review of Systems  Gastrointestinal: Positive for abdominal pain.  All other systems reviewed and are negative.   Physical Exam Updated Vital  Signs BP 136/82   Pulse 65   Temp 98.6 F (37 C) (Oral)   Resp 12   LMP 12/31/2019   SpO2 94%   Physical Exam Vitals and nursing note reviewed.  Constitutional:      Appearance: She is well-developed and well-nourished.  HENT:     Head: Normocephalic and atraumatic.  Cardiovascular:     Rate and Rhythm: Normal rate and regular rhythm.     Heart sounds: No murmur heard.   Pulmonary:     Effort: Pulmonary effort is normal. No respiratory distress.     Breath sounds: Normal breath sounds.  Abdominal:     Palpations: Abdomen is soft.     Tenderness: There is abdominal tenderness. There is no rebound.     Comments: Generalized abdominal tenderness, voluntary guarding.   Musculoskeletal:        General: No tenderness or edema.     Comments: Trace edema to ble  Skin:    General: Skin is warm and dry.  Neurological:     Mental Status: She is alert and oriented to person, place, and time.  Psychiatric:        Mood and Affect: Mood and affect normal.        Behavior: Behavior normal.     ED Results / Procedures / Treatments   Labs (all labs ordered are listed, but only abnormal results are displayed) Labs Reviewed  URINALYSIS, ROUTINE W REFLEX MICROSCOPIC - Abnormal; Notable for the following components:      Result Value   Color, Urine STRAW (*)    Specific Gravity, Urine 1.004 (*)    All other components within normal limits   LIPASE, BLOOD  COMPREHENSIVE METABOLIC PANEL  CBC    EKG None  Radiology CT Abdomen Pelvis W Contrast  Result Date: 12/31/2019 CLINICAL DATA:  Abdominal distension. Abdominal pain for the past 3-4 weeks. EXAM: CT ABDOMEN AND PELVIS WITH CONTRAST TECHNIQUE: Multidetector CT imaging of the abdomen and pelvis was performed using the standard protocol following bolus administration of intravenous contrast. CONTRAST:  138mL OMNIPAQUE IOHEXOL 300 MG/ML  SOLN COMPARISON:  Ultrasound abdomen 09/12/2005 report without images FINDINGS: Lower chest: Bilateral lower lobe subsegmental atelectasis. No acute abnormality. Hepatobiliary: No focal liver abnormality. Calcified gallstone within the gallbladder lumen. No gallbladder wall thickening or pericholecystic fluid. No biliary dilatation. Pancreas: No focal lesion. Normal pancreatic contour. No surrounding inflammatory changes. No main pancreatic ductal dilatation. Spleen: Normal in size without focal abnormality. Adrenals/Urinary Tract: No adrenal nodule bilaterally. Bilateral kidneys enhance symmetrically. No hydronephrosis. No hydroureter. On delayed imaging, there is no urothelial wall thickening and there are no filling defects in the opacified portions of the bilateral collecting systems or ureters. The urinary bladder is unremarkable. Stomach/Bowel: Stomach is within normal limits. No evidence of bowel wall thickening or dilatation. Normal bowel wall enhancement. No pneumatosis. Stool throughout the colon. The appendix is not definitely identified. Vascular/Lymphatic: No abdominal aorta or iliac aneurysm. Mild atherosclerotic plaque of the aorta and its branches. No abdominal, pelvic, or inguinal lymphadenopathy. Reproductive: Slight irregularity of the anterior uterine wall and fundal wall suggestive of fibroids. Otherwise the uterus and bilateral adnexa are unremarkable. Other: No intraperitoneal free fluid. No intraperitoneal free gas. No organized fluid  collection. Musculoskeletal: No abdominal wall hernia or abnormality. Partially visualized total right hip arthroplasty. No suspicious lytic or blastic osseous lesions. No acute displaced fracture. Multilevel degenerative changes of the spine. Grade 1 anterolisthesis of L4 on L5. IMPRESSION: 1. Cholelithiasis with no  CT findings to suggest acute cholecystitis or choledocholithiasis. 2. Uterine fibroid. Electronically Signed   By: Iven Finn M.D.   On: 12/31/2019 22:04    Procedures Procedures (including critical care time)  Medications Ordered in ED Medications  iohexol (OMNIPAQUE) 300 MG/ML solution 100 mL (100 mLs Intravenous Contrast Given 12/31/19 2135)    ED Course  I have reviewed the triage vital signs and the nursing notes.  Pertinent labs & imaging results that were available during my care of the patient were reviewed by me and considered in my medical decision making (see chart for details).    MDM Rules/Calculators/A&P                         patient here for evaluation of abdominal pain for the last month and a half. She has non-localizable tenderness on examination. UA not consistent with UTI. Labs essentially within normal limits. CT abdomen pelvis was obtained, which is negative for appendicitis or mass. CT does incidentally show cholelithiasis. Presentation is not consistent with cholecystitis. Discussed with patient home care for abdominal pain, unclear source. Will treat with empiric trial of bentyl. Discussed G.I. follow-up and return precautions.  Final Clinical Impression(s) / ED Diagnoses Final diagnoses:  Generalized abdominal pain    Rx / DC Orders ED Discharge Orders         Ordered    dicyclomine (BENTYL) 20 MG tablet  3 times daily PRN        12/31/19 2258           Quintella Reichert, MD 01/01/20 0117

## 2019-12-31 NOTE — ED Triage Notes (Signed)
Patient reports she has had abdominal pain x3-4 weeks. Patient sent from urgent care. Patient reports the last 2 days she has had unbearable abdominal pain.

## 2019-12-31 NOTE — ED Provider Notes (Signed)
Pt sent to [facility] from [facility] for CT abd, unfortunately appropriate CT scan with 2hr contrast not available at this time. Discussed with pt going to ED due to reported worsening abdominal pain.  Pt declined EMS, plans to drive POV to [facility].  Pt discharged in good condition.    Noe Gens, [place] 12/31/19 [phone]

## 2019-12-31 NOTE — Telephone Encounter (Signed)
Refer to gastro since we couldn't get a scan approved

## 2019-12-31 NOTE — Telephone Encounter (Signed)
Pt. Called back stating that she is now at the ER she has been to two different urgent care's today and both suggested she go to the ER. I let her know that saw her message and that you had put in for a referral to GI. She said she has been hearing that story for the past 4-6 weeks and nothing has been done yet. She stated she couldn't get in to GI until 01/20/19 and that's too long to wait for the amount of pain she is in. She said to make sure I document this call because she has been trying to get something done about this issue she is having and we have not done anything to help her.

## 2019-12-31 NOTE — ED Triage Notes (Signed)
Patient states she has had abdominal pain x 3-4 weeks and says it feels like a cyst on her ovary. Pt states she now has LLQ and RLQ pain and nausea as well. Pt is aox4 and ambulatory.

## 2020-01-01 ENCOUNTER — Encounter: Payer: Self-pay | Admitting: Medical

## 2020-01-01 DIAGNOSIS — M7062 Trochanteric bursitis, left hip: Secondary | ICD-10-CM | POA: Diagnosis not present

## 2020-01-01 NOTE — Telephone Encounter (Signed)
Message has been sent via mychart informing patient that she will be getting a letter in the mail soon.

## 2020-01-01 NOTE — Telephone Encounter (Signed)
[facility] - I reviewed her ED notes, her response.  Please snail mail or (copy and paste) email the letter I printed today.  If you can, call her and either read my letter below or send her my letter response by email if you can do that through the [facility] system.   To Kristi Avila:  I am writing to respond to your recent phone call.  I saw you back in October for a well visit which is also known as a physical orders routine screening visit.  The purpose of that visit is screenings for cancer, updating vaccines, reviewing her health record, and giving recommendations on keeping healthy.  You had several concerns at that visit in addition to the general screening nature of the visit.  These are all extra things are extra topics that were discussed that take extra time.  On a well visit we do not have adequate time to address all the problems that you may be facing or dealing with that one given time.  It would be appropriate to come back in for a follow-up visit if you have several issues or concerns.  You did not have abdominal pain complaints today I saw you in your physical.  Or if you mentioned it, it was in passing and not a substantial part of the visit.  I specifically reviewed my notes and your exam findings that day or such that you are not tender at all and no abdominal abnormality on exam.  So instead of coming back to see me in follow-up for this specific issue that apparently was significant later on after the October visit, you relied on email back-and-forth numerous times to try and help diagnose something that cannot really be diagnosed over email.  I am grateful that we have email now to communicate which can save time for you and me and can facilitate good communication.  However sometimes this gets in the way of appropriate care.  I did try to address your concerns when you emailed me.  You saw your gynecologist to rule out a gynecological cause of your pain.  I answered several of your  questions on email.  But since the pain continued, you should have made a follow-up appointment so we can discuss further.  We did make a very valiant attempt to scan your abdomen and pelvis with a CT scan to give [place] answers with your insurance is extremely difficult and would not approve the scan.  So this week when you went to 2 different urgent cares and emergency department.  We did start the process of gastroenterology referral earlier in the week when you asked me to do this, now the gynecology had cleared you from their perspective.  So it is a little surprising that you are upset with me when we have tried to work with you.  At this point your emergency department notes show that they still did not find a specific cause of your pain despite scans and other evaluation.  I recommend you come back in and lets discuss further and review all the information that is been evaluated to this point regarding your abdominal pain.  For future reference, email should really be quick communication for some minor clarification.  However email should not be used to try to make medical decisions, and to try to handle all your concerns.   That is why we see people for visits.   Sincerely,   Dorothea Ogle, PA-C

## 2020-01-04 DIAGNOSIS — Z1231 Encounter for screening mammogram for malignant neoplasm of breast: Secondary | ICD-10-CM | POA: Diagnosis not present

## 2020-01-04 LAB — HM MAMMOGRAPHY

## 2020-01-14 ENCOUNTER — Other Ambulatory Visit: Payer: Self-pay | Admitting: Medical

## 2020-01-14 NOTE — Telephone Encounter (Signed)
Is this okay to refill? I did call and confirm that patient did need and not auto generated refill.

## 2020-01-15 ENCOUNTER — Other Ambulatory Visit: Payer: Self-pay | Admitting: Medical

## 2020-01-16 HISTORY — PX: TOTAL HIP ARTHROPLASTY: SHX124

## 2020-01-19 ENCOUNTER — Encounter: Payer: Self-pay | Admitting: Medical

## 2020-01-20 ENCOUNTER — Ambulatory Visit: Payer: BC Managed Care – PPO | Admitting: Nurse Practitioner

## 2020-01-20 ENCOUNTER — Encounter: Payer: Self-pay | Admitting: Nurse Practitioner

## 2020-01-20 VITALS — BP 130/70 | HR 76 | Ht 66.0 in | Wt 209.0 lb

## 2020-01-20 DIAGNOSIS — K5909 Other constipation: Secondary | ICD-10-CM

## 2020-01-20 DIAGNOSIS — R1012 Left upper quadrant pain: Secondary | ICD-10-CM | POA: Diagnosis not present

## 2020-01-20 DIAGNOSIS — K219 Gastro-esophageal reflux disease without esophagitis: Secondary | ICD-10-CM | POA: Diagnosis not present

## 2020-01-20 MED ORDER — LUBIPROSTONE 24 MCG PO CAPS: 24.0000 ug | ORAL_CAPSULE | Freq: Two times a day (BID) | ORAL | 3 refills | Status: DC

## 2020-01-20 NOTE — Patient Instructions (Signed)
If you are age 64 or older, your body mass index should be between 23-30. Your Body mass index is 33.73 kg/m. If this is out of the aforementioned range listed, please consider follow up with your Primary Care Provider.  If you are age 27 or younger, your body mass index should be between 19-25. Your Body mass index is 33.73 kg/m. If this is out of the aformentioned range listed, please consider follow up with your Primary Care Provider.   START Lubiprostone (Amitiza) 24 mcg 1 capsule twice daily If your insurance won't cover this then continue on to the Trulance and add Miralax to your regimen.  Call the office and speak with Beth to update [place] on how you are doing.  Thank you for entrusting me with your care and choosing [facility].  Willette Cluster, NP-C

## 2020-01-20 NOTE — Progress Notes (Addendum)
ASSESSMENT AND PLAN    # 65 yo female with nearly constant , achy LLQ discomfort unrelated to eating, bowel movements, physical activity or position.  Unrevealing labs and CT scan of the abdomen and pelvis with contrast in ED 12/31/2019.  She has a history of chronic constipation.  Recent CT scan did show stool throughout the colon.  We can improve upon her bowel regimen and see if that helps with the discomfort.  She had an MRI of her left hip yesterday and is to get results from Ortho tomorrow.  Might some of her pain be musculoskeletal ? --She has been on Trulance for fsor 3 years. Also takes Mg+ and fiber. Will switch from Trulance to Amitiza 24 mcg BID. Continue Mg+ and fiber. If insurance will not pay for Amitiza, continue Trulance and add a capful of Miralax daily.  --Call in 2-3 weeks with update. If BMs are better but still having LLQ discomfort then will try low dose of Dicyclomine.  Addendum 02/08/19:  Patient called to let me know she has a left labral tear. This is likely the cause of above pain. Unfortunately it sound like surgery is the only remedy.   # Chronic GERD, no Barrett's esophagus on EGD March 2020 . Having breakthrough symptoms on Dexilant in am and Pepcid q am. She recently gained 20 pounds which may be contributing to breakthrough symptoms. Has joined [employer].  --Continue weight loss efforts --Antireflux measures discussed --Continue current GERD regimen   HISTORY OF PRESENT ILLNESS     Primary Gastroenterologist : Oretha Caprice, MD      Chief Complaint : abdominal pain   Kristi Avila is a 65 y.o. female with PMH / [place] significant for,  but not necessarily limited to: Chronic constipation, chronic GERD  Patient is here for evaluation of LLQ pain.  The pain started several months ago, PCP was trying to arrange for CT scan but apparently there were problems with insurance authorization. She saw GYN, says they did an [place] and didn't feel the pain was GYN  related but they couldn't see left ovary because "intestine was in the way" She was seen in the ED 12/31/2019 for evaluation of.lower abdominal pain radiating into epigastrium. Labs and CT scan were unremarkable except for cholelithiasis. Though the generalized lower abdominal pain resolved she is still left with nagging LLQ pain. Pain nearly constant, a dull ache. Food, physical activity nor position exacerbate the pain. She denies urinary symptoms. She has had left hip pain for years, got  MRI of left hip (by Ortho) and sees them tomorrow for results. She has been getting injections in left hip and also went through PT. She says the MRI was done to evaluate for a torn tendon.   Patient give a history of chronic constipation. She has taken Trulance for 3 years but three months ago added bid Mg+ and Metamucil. She drinks 120 oz water a day.She often has to ise her finger to apply pressure to perineum to assist with passage of stool.  Stools often small or in balls. CT scan in ED did show stool throughout colon  Data Reviewed: 12/31/2019 ED visit for abdominal pain --C-Met, lipase normal.  CBC normal CT scan with contrast without acute findings.  Cholelithiasis without evidence for acute cholecystitis or CBD stone   Previous Endoscopic Evaluations / Pertinent Studies:  March 2020 EGD for heartburn --Mild gastritis  September 2017 screening colonoscopy --Normal exam.  Repeat in 10 years  Past Medical History:  Diagnosis Date  . Arthritis   . Barrett's esophagus with esophagitis 07/23/12   EGD: CLO test results negative. pathology: Barretss: Repeat EGD 3 years  . Blood transfusion without reported diagnosis   . Depression   . Family history of anesthesia complication    mother trouble waking up  . Frequency of urination   . GERD (gastroesophageal reflux disease)    h/o peptic ulcer  . High cholesterol   . HSV-2 infection    anal  . Nocturia   . Obesity   . Overactive bladder   . Reflux    . Urgency of urination     Current Medications, Allergies, Past Surgical History, Family History and Social History were reviewed in [employer] record.   Current Outpatient Medications  Medication Sig Dispense Refill  . ALPRAZolam (XANAX) 0.5 MG tablet Take 0.25-0.5 mg by mouth at bedtime.    . cholecalciferol (VITAMIN D3) 25 MCG (1000 UNIT) tablet Take 1,000 Units by mouth daily.    . ciprofloxacin (CIPRO) 250 MG tablet Take 250 mg by mouth. One the AM after intercourse    . Cyanocobalamin (VITAMIN B 12 PO) Take 1 tablet by mouth daily.    Marland Kitchen DEXILANT 60 MG capsule TAKE 1 CAPSULE(60 MG) BY MOUTH DAILY (Patient taking differently: Take 60 mg by mouth daily.) 90 capsule 0  . DULoxetine (CYMBALTA) 60 MG capsule Take 1 capsule (60 mg total) by mouth 2 (two) times daily. (Patient taking differently: Take 120 mg by mouth daily.) 180 capsule 0  . ezetimibe (ZETIA) 10 MG tablet TAKE 1 TABLET(10 MG) BY MOUTH EVERY MORNING (Patient taking differently: Take 10 mg by mouth daily.) 30 tablet 0  . famotidine (PEPCID) 20 MG tablet Take 1 tablet (20 mg total) by mouth at bedtime. 30 tablet 5  . guaiFENesin (MUCINEX) 600 MG 12 hr tablet Take 600 mg by mouth daily as needed.    . Magnesium 400 MG TABS Take 1 capsule by mouth 2 (two) times daily.    . meloxicam (MOBIC) 15 MG tablet Take 1 tablet (15 mg total) by mouth daily. 30 tablet 1  . psyllium (METAMUCIL) 58.6 % powder Take by mouth. 2 teaspoons nightly    . TRULANCE 3 MG TABS TAKE 1 TABLET BY MOUTH DAILY (Patient taking differently: Take 3 mg by mouth daily.) 30 tablet 2  . valACYclovir (VALTREX) 500 MG tablet TAKE 1 TABLET(500 MG) BY MOUTH DAILY (Patient taking differently: Take 500 mg by mouth daily.) 90 tablet 3   No current facility-administered medications for this visit.    Review of Systems: No chest pain. No shortness of breath. No urinary complaints.   PHYSICAL EXAM :    Wt Readings from Last 3 Encounters:   01/20/20 209 lb (94.8 kg)  11/02/19 206 lb 9.6 oz (93.7 kg)  03/25/19 205 lb 6.4 oz (93.2 kg)    BP 130/70   Pulse 76   Ht _0  (1.676 m)   Wt 209 lb (94.8 kg)   LMP 12/31/2019   BMI 33.73 kg/m  Constitutional:  Pleasant female in no acute distress. Psychiatric: Normal mood and affect. Behavior is normal. EENT: Pupils normal.  Conjunctivae are normal. No scleral icterus. Neck supple.  Cardiovascular: Normal rate, regular rhythm. No edema Pulmonary/chest: Effort normal and breath sounds normal. No wheezing, rales or rhonchi. Abdominal: Soft, nondistended, nontender. Bowel sounds active throughout. There are no masses palpable. No hepatomegaly. Neurological: Alert and oriented to person place and  time. Skin: Skin is warm and dry. No rashes noted.  Tye Savoy, NP  01/20/2020, 12:02 PM

## 2020-01-21 DIAGNOSIS — S76012D Strain of muscle, fascia and tendon of left hip, subsequent encounter: Secondary | ICD-10-CM | POA: Diagnosis not present

## 2020-01-21 DIAGNOSIS — M24152 Other articular cartilage disorders, left hip: Secondary | ICD-10-CM | POA: Diagnosis not present

## 2020-01-21 NOTE — Progress Notes (Signed)
I agree with the above note, plan 

## 2020-01-22 ENCOUNTER — Telehealth: Payer: Self-pay

## 2020-01-22 NOTE — Telephone Encounter (Signed)
Prior authorization has been started for patient's Amitiza

## 2020-01-28 NOTE — Telephone Encounter (Signed)
Patient's Amitiza/Lubiprostone has been denied by her insurance. She has to fail Trulance first, which will also need a prior authorization first. Can I send this in?

## 2020-02-01 NOTE — Telephone Encounter (Signed)
Yes, let's try Trulance 3 mg daily, # 30. Thanks

## 2020-02-02 NOTE — Telephone Encounter (Signed)
Called and spoke with patient to let her know that her insurance was not going to cover the Amitiza, but would cover Trulance. Patient advised me over the phone that her Primary care has had her on Trulance for 2-3 years it it is not working for her anymore. I advised that I would try again to get the Amitiza approved.

## 2020-02-03 DIAGNOSIS — M25552 Pain in left hip: Secondary | ICD-10-CM | POA: Diagnosis not present

## 2020-02-04 MED ORDER — LUBIPROSTONE 24 MCG PO CAPS: 24.0000 ug | ORAL_CAPSULE | Freq: Two times a day (BID) | ORAL | 3 refills | Status: DC

## 2020-02-04 NOTE — Telephone Encounter (Signed)
Resubmitted prior authorization.

## 2020-02-04 NOTE — Telephone Encounter (Signed)
Patient has been approved for Amitiza Effective from 02/04/2020 through 02/02/2021. Drug: Lubiprostone 24MCG capsules  Patient has been advised.

## 2020-02-05 ENCOUNTER — Other Ambulatory Visit: Payer: Self-pay | Admitting: Medical

## 2020-02-09 ENCOUNTER — Encounter: Payer: Self-pay | Admitting: Internal Medicine

## 2020-02-19 DIAGNOSIS — M1612 Unilateral primary osteoarthritis, left hip: Secondary | ICD-10-CM | POA: Diagnosis not present

## 2020-02-19 DIAGNOSIS — M7062 Trochanteric bursitis, left hip: Secondary | ICD-10-CM | POA: Diagnosis not present

## 2020-02-26 ENCOUNTER — Telehealth: Payer: Self-pay | Admitting: Medical

## 2020-02-26 NOTE — Telephone Encounter (Signed)
Pt scheduled for April

## 2020-02-26 NOTE — Telephone Encounter (Signed)
I received preop evaluation form to complete on patient.  She is apparently having surgery soon  We did a physical and labs recently.  However her last EKG was from 2019  Have her come in for a brief preop visit and EKG

## 2020-02-27 ENCOUNTER — Other Ambulatory Visit: Payer: Self-pay | Admitting: Medical

## 2020-03-02 DIAGNOSIS — D1722 Benign lipomatous neoplasm of skin and subcutaneous tissue of left arm: Secondary | ICD-10-CM | POA: Diagnosis not present

## 2020-03-02 DIAGNOSIS — L570 Actinic keratosis: Secondary | ICD-10-CM | POA: Diagnosis not present

## 2020-03-02 DIAGNOSIS — L738 Other specified follicular disorders: Secondary | ICD-10-CM | POA: Diagnosis not present

## 2020-03-02 DIAGNOSIS — L820 Inflamed seborrheic keratosis: Secondary | ICD-10-CM | POA: Diagnosis not present

## 2020-03-02 DIAGNOSIS — Z85828 Personal history of other malignant neoplasm of skin: Secondary | ICD-10-CM | POA: Diagnosis not present

## 2020-03-04 ENCOUNTER — Telehealth: Payer: Self-pay

## 2020-03-04 ENCOUNTER — Other Ambulatory Visit: Payer: Self-pay | Admitting: Medical

## 2020-03-04 DIAGNOSIS — Z20822 Contact with and (suspected) exposure to covid-19: Secondary | ICD-10-CM | POA: Diagnosis not present

## 2020-03-04 NOTE — Telephone Encounter (Signed)
P.A. DEXLANSOPRAZOLE 

## 2020-03-08 ENCOUNTER — Other Ambulatory Visit: Payer: Self-pay | Admitting: Medical

## 2020-03-13 NOTE — Telephone Encounter (Signed)
P.A. denied, Marca Ancona is covered, called pharmacy & pt picked up brand

## 2020-03-27 ENCOUNTER — Other Ambulatory Visit: Payer: Self-pay | Admitting: Medical

## 2020-03-28 ENCOUNTER — Other Ambulatory Visit: Payer: Self-pay | Admitting: Medical

## 2020-04-07 ENCOUNTER — Encounter: Payer: Self-pay | Admitting: Medical

## 2020-04-07 ENCOUNTER — Other Ambulatory Visit: Payer: Self-pay

## 2020-04-07 ENCOUNTER — Ambulatory Visit (INDEPENDENT_AMBULATORY_CARE_PROVIDER_SITE_OTHER): Payer: BC Managed Care – PPO | Admitting: Medical

## 2020-04-07 VITALS — BP 136/78 | HR 81 | Ht 66.0 in | Wt 207.2 lb

## 2020-04-07 DIAGNOSIS — R42 Dizziness and giddiness: Secondary | ICD-10-CM | POA: Diagnosis not present

## 2020-04-07 NOTE — Progress Notes (Signed)
Subjective:  Kristi Avila is a 65 y.o. female who presents for Chief Complaint  Patient presents with  . Hypotension    Low blood pressure with left arm numbness. Feeling a little light headed      Here for dizziness.  Was in normal state of health until this afternoon.  Up until today has been feeling fine, no particular issues.  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  She works in a [employer].  When she got up to file papers, she felt lightheaded briefly like she could pass out.  She ended up lying down on the couch.  Coworker used wrist BP cuff and got BP readings of 99/58.   They felt she needed to come in to PCP.  She notes weaning off xanax 2 weeks ago by GI when Amitriptyline was added to help with bladder spasm.   She notes in the past she has felt dizzy like she felt today with panic attacks.    No snoring, no witnessed apnea, no hx/o sleep study.  No other aggravating or relieving factors.    No other c/o.  Past Medical History:  Diagnosis Date  . Arthritis   . Barrett's esophagus with esophagitis 07/23/12   EGD: CLO test results negative. pathology: Barretss: Repeat EGD 3 years  . Blood transfusion without reported diagnosis   . Depression   . Family history of anesthesia complication    mother trouble waking up  . Frequency of urination   . GERD (gastroesophageal reflux disease)    h/o peptic ulcer  . High cholesterol   . HSV-2 infection    anal  . Nocturia   . Obesity   . Overactive bladder   . Reflux   . Urgency of urination    Current Outpatient Medications on File Prior to Visit  Medication Sig Dispense Refill  . cholecalciferol (VITAMIN D3) 25 MCG (1000 UNIT) tablet Take 1,000 Units by mouth daily.    . Cyanocobalamin (VITAMIN B 12 PO) Take 1 tablet by mouth daily.    Marland Kitchen dexlansoprazole (DEXILANT) 60 MG capsule TAKE 1 CAPSULE(60 MG) BY MOUTH DAILY 90 capsule 0  . DULoxetine (CYMBALTA) 60 MG capsule TAKE  ONE CAPSULE BY MOUTH TWICE DAILY 180 capsule 0  . ezetimibe (ZETIA) 10 MG tablet TAKE 1 TABLET(10 MG) BY MOUTH EVERY MORNING 90 tablet 0  . famotidine (PEPCID) 20 MG tablet Take 1 tablet (20 mg total) by mouth at bedtime. 30 tablet 5  . Magnesium 400 MG TABS Take 1 capsule by mouth 2 (two) times daily.    . meloxicam (MOBIC) 15 MG tablet TAKE 1 TABLET(15 MG) BY MOUTH DAILY 30 tablet 1  . MULTIPLE VITAMIN PO Take 1 tablet by mouth daily.    . psyllium (METAMUCIL) 58.6 % powder Take by mouth. 2 teaspoons nightly    . TRULANCE 3 MG TABS TAKE 1 TABLET BY MOUTH DAILY 30 tablet 2  . valACYclovir (VALTREX) 500 MG tablet TAKE 1 TABLET(500 MG) BY MOUTH DAILY (Patient taking differently: Take 500 mg by mouth daily.) 90 tablet 3  . ALPRAZolam (XANAX) 0.5 MG tablet Take 0.25-0.5 mg by mouth at bedtime. (Patient not taking: Reported on 04/07/2020)    . ciprofloxacin (CIPRO) 250 MG tablet Take 250 mg by mouth. One the AM after intercourse (Patient not taking: Reported on 04/07/2020)    . guaiFENesin (MUCINEX) 600 MG 12 hr tablet Take 600 mg by mouth daily  as needed. (Patient not taking: Reported on 04/07/2020)    . lubiprostone (AMITIZA) 24 MCG capsule Take 1 capsule (24 mcg total) by mouth 2 (two) times daily with a meal. (Patient not taking: Reported on 04/07/2020) 60 capsule 3   No current facility-administered medications on file prior to visit.     The following portions of the patient's history were reviewed and updated as appropriate: allergies, current medications, past family history, past medical history, past social history, past surgical history and problem list.  ROS Otherwise as in subjective above  Objective: BP 136/78   Pulse 81   Ht 5\' 6"  (1.676 m)   Wt 207 lb 3.2 oz (94 kg)   LMP 12/31/2019   SpO2 95%   BMI 33.44 kg/m   General appearance: alert, no distress, well developed, well nourished HEENT: normocephalic, sclerae anicteric, conjunctiva pink and moist, TMs pearly, nares patent,  no discharge or erythema, pharynx normal Neck: supple, no lymphadenopathy, no thyromegaly, no masses Heart: RRR, normal S1, S2, no murmurs Lungs: CTA bilaterally, no wheezes, rhonchi, or rales Abdomen: +bs, soft, non tender, non distended, no masses, no hepatomegaly, no splenomegaly Pulses: 2+ radial pulses, 2+ pedal pulses, normal cap refill Ext: no edema Neuro: cn2-12 intact, nonfocal exam   EKG Indication dizziness, rate 69 bpm, PR 177ms, QRS 1110ms, QTC 477ms, axis 79 degrees, nsr  Orthostatic vitals normal     Assessment: Encounter Diagnosis  Name Primary?  . Dizziness Yes     Plan: Discussed symptoms, normal EKG and orthostatic vitals.  I suspect positional hypotension/vasovagal hypotension.  Advised less abrupt change from sitting or lying to standing.   She can use OTC meclizine the next few days.  Advised if any new or worsening symptom to recheck.  However, if any new symptoms suggestive of TIA/CVA such as unilateral weakness, numbness, tingling, vision change, headache or worse dizziness, to call 911  Veronika was seen today for hypotension.  Diagnoses and all orders for this visit:  Dizziness -     EKG 12-Lead   Follow up: soon as planned for surgical clearance

## 2020-04-08 ENCOUNTER — Encounter: Payer: Self-pay | Admitting: Medical

## 2020-04-25 ENCOUNTER — Encounter: Payer: Self-pay | Admitting: Medical

## 2020-04-25 ENCOUNTER — Ambulatory Visit (INDEPENDENT_AMBULATORY_CARE_PROVIDER_SITE_OTHER): Payer: BC Managed Care – PPO | Admitting: Medical

## 2020-04-25 ENCOUNTER — Other Ambulatory Visit: Payer: Self-pay

## 2020-04-25 VITALS — BP 123/82 | HR 87 | Ht 66.0 in | Wt 207.8 lb

## 2020-04-25 DIAGNOSIS — Z130 Encounter for screening for diseases of the blood and blood-forming organs and certain disorders involving the immune mechanism: Secondary | ICD-10-CM | POA: Diagnosis not present

## 2020-04-25 DIAGNOSIS — Z01818 Encounter for other preprocedural examination: Secondary | ICD-10-CM

## 2020-04-25 NOTE — Progress Notes (Signed)
Subjective:  Kristi Avila is a 65 y.o. female who presents for Chief Complaint  Patient presents with  . Pre-op Exam     Here for preop exam.  Has upcoming hip replacement left total with [facility], Dr. Hector Shade.   Denies prior difficulty with anesthesia other than nausea.   This surgery is suppose to be spinal anesthesia only.  Had dizziness few weeks ago, came in for visit here.  Had EKG done.  Doing fine now, no additional dizziness or problem  No other aggravating or relieving factors.    No other c/o.  Past Medical History:  Diagnosis Date  . Arthritis   . Barrett's esophagus with esophagitis 07/23/12   EGD: CLO test results negative. pathology: Barretss: Repeat EGD 3 years  . Blood transfusion without reported diagnosis    remote past  . Depression   . Family history of anesthesia complication    mother trouble waking up  . Frequency of urination   . GERD (gastroesophageal reflux disease)    h/o peptic ulcer  . High cholesterol   . HSV-2 infection    anal  . Nocturia   . Obesity   . Overactive bladder   . Reflux   . Urgency of urination    Current Outpatient Medications on File Prior to Visit  Medication Sig Dispense Refill  . cholecalciferol (VITAMIN D3) 25 MCG (1000 UNIT) tablet Take 1,000 Units by mouth daily.    . Cyanocobalamin (VITAMIN B 12 PO) Take 1 tablet by mouth daily.    Marland Kitchen dexlansoprazole (DEXILANT) 60 MG capsule TAKE 1 CAPSULE(60 MG) BY MOUTH DAILY 90 capsule 0  . DULoxetine (CYMBALTA) 60 MG capsule TAKE ONE CAPSULE BY MOUTH TWICE DAILY 180 capsule 0  . ezetimibe (ZETIA) 10 MG tablet TAKE 1 TABLET(10 MG) BY MOUTH EVERY MORNING 90 tablet 0  . famotidine (PEPCID) 20 MG tablet Take 1 tablet (20 mg total) by mouth at bedtime. 30 tablet 5  . Magnesium 400 MG TABS Take 1 capsule by mouth 2 (two) times daily.    . meloxicam (MOBIC) 15 MG tablet TAKE 1 TABLET(15 MG) BY MOUTH DAILY 30 tablet 1  . MULTIPLE VITAMIN PO Take 1 tablet by mouth daily.    .  psyllium (METAMUCIL) 58.6 % powder Take by mouth. 2 teaspoons nightly    . TRULANCE 3 MG TABS TAKE 1 TABLET BY MOUTH DAILY 30 tablet 2  . valACYclovir (VALTREX) 500 MG tablet TAKE 1 TABLET(500 MG) BY MOUTH DAILY (Patient taking differently: Take 500 mg by mouth daily.) 90 tablet 3   No current facility-administered medications on file prior to visit.    The following portions of the patient's history were reviewed and updated as appropriate: allergies, current medications, past family history, past medical history, past social history, past surgical history and problem list.  ROS Otherwise as in subjective above     Objective: BP 140/80   Pulse 99   Ht 5\' 6"  (1.676 m)   Wt 207 lb 12.8 oz (94.3 kg)   LMP 12/31/2019   SpO2 96%   BMI 33.54 kg/m   Wt Readings from Last 3 Encounters:  04/25/20 207 lb 12.8 oz (94.3 kg)  04/07/20 207 lb 3.2 oz (94 kg)  01/20/20 209 lb (94.8 kg)   BP Readings from Last 3 Encounters:  04/25/20 140/80  04/07/20 136/78  01/20/20 130/70    General appearance: alert, no distress, well developed, well nourished, white female HEENT: normocephalic, sclerae anicteric, conjunctiva pink  and moist, TMs pearly, nares patent, no discharge or erythema, pharynx normal Oral cavity: MMM, no lesions, no small airway Neck: supple, no lymphadenopathy, no thyromegaly, no masses, no jvd Heart: RRR, normal S1, S2, no murmurs Lungs: CTA bilaterally, no wheezes, rhonchi, or rales Pulses: 2+ radial pulses, 2+ pedal pulses, normal cap refill Ext: no edema CN 2-12 intact, nonfocal exam Psych: pleasant, good eye contact, answers questions appropriately    Assessment: Encounter Diagnoses  Name Primary?  . Preop examination Yes  . Screening for blood disease      Plan: I reviewed her recent EKG from 04/07/2020.  Labs today as below.  No major risk.  Blood pressure is a little elevated.  She will monitor these and get me some home readings in the near  future  Provided no unusual lab findings she will be cleared for surgery  Kristi Avila was seen today for pre-op exam.  Diagnoses and all orders for this visit:  Preop examination -     CBC with Differential/Platelet -     Comprehensive metabolic panel -     Protime-INR -     APTT  Screening for blood disease -     CBC with Differential/Platelet -     Comprehensive metabolic panel -     Protime-INR -     APTT    Follow up: pending labs

## 2020-04-25 NOTE — Patient Instructions (Signed)
Hypertension (high blood pressure):  Hypertension, commonly called high blood pressure, is when the force of blood pumping through your arteries is too strong. Your arteries are the blood vessels that carry blood from your heart throughout your body. A blood pressure reading consists of a higher number over a lower number, such as 110/72. The higher number (systolic) is the pressure inside your arteries when your heart pumps. The lower number (diastolic) is the pressure inside your arteries when your heart relaxes. Ideally you want your blood pressure below 120/80. Hypertension forces your heart to work harder to pump blood. Your arteries may become narrow or stiff. Having hypertension puts you at risk for heart disease, stroke, and other problems.  RISK FACTORS Some risk factors for high blood pressure are controllable. Others are not.  Risk factors you cannot control include:   Race. You may be at higher risk if you are African American.  Age. Risk increases with age.  Gender. Men are at higher risk than women before age 88 years. After age 49, women are at higher risk than men. Risk factors you can control include:  Not getting enough exercise or physical activity.  Being overweight.  Getting too much fat, sugar, calories, or salt in your diet.  Drinking too much alcohol. SIGNS AND SYMPTOMS Hypertension does not usually cause signs or symptoms. Extremely high blood pressure (hypertensive crisis) may cause headache, anxiety, shortness of breath, and nosebleed. DIAGNOSIS  To check if you have hypertension, your health care provider will measure your blood pressure while you are seated, with your arm held at the level of your heart. It should be measured at least twice using the same arm. Certain conditions can cause a difference in blood pressure between your right and left arms. A blood pressure reading that is higher than normal on one occasion does not mean that you need treatment. If  one blood pressure reading is high, ask your health care provider about having it checked again. BLOOD PRESSURE STAGES Blood pressure is classified into four stages: normal, prehypertension, stage 1, and stage 2. Your blood pressure reading will be used to determine what type of treatment, if any, is necessary. Appropriate treatment options are tied to these four stages:  Normal  Systolic pressure (mm Hg): below 120.  Diastolic pressure (mm Hg): below 80. Prehypertension  Systolic pressure (mm Hg): 120 to 139.  Diastolic pressure (mm Hg): 80 to 89. Stage1  Systolic pressure (mm Hg): 140 to 159.  Diastolic pressure (mm Hg): 90 to 99. Stage2  Systolic pressure (mm Hg): 160 or above.  Diastolic pressure (mm Hg): 100 or above. RISKS RELATED TO HIGH BLOOD PRESSURE Managing your blood pressure is an important responsibility. Uncontrolled high blood pressure can lead to:  A heart attack.  A stroke.  A weakened blood vessel (aneurysm).  Heart failure.  Kidney damage.  Eye damage.  Metabolic syndrome.  Memory and concentration problems. TREATMENT  Treating high blood pressure includes making lifestyle changes and possibly taking medicine. Living a healthy lifestyle can help lower high blood pressure. You may need to change some of your habits. Lifestyle changes may include:  Following the DASH diet. This diet is high in fruits, vegetables, and whole grains. It is low in salt, red meat, and added sugars.  Getting at least 2 hours of brisk physical activity every week.  Losing weight if necessary.  Not smoking.  Limiting alcoholic beverages.  Learning ways to reduce stress. If lifestyle changes are not enough to  get your blood pressure under control, your health care provider may prescribe medicine. You may need to take more than one. Work closely with your health care provider to understand the risks and benefits. HOME CARE INSTRUCTIONS  Have your blood pressure  rechecked as directed by your health care provider.   Take medicines only as directed by your health care provider. Follow the directions carefully. Blood pressure medicines must be taken as prescribed. The medicine does not work as well when you skip doses. Skipping doses also puts you at risk for problems.   Do not smoke.   Monitor your blood pressure at home as directed by your health care provider. SEEK MEDICAL CARE IF:   You think you are having a reaction to medicines taken.  You have recurrent headaches or feel dizzy.  You have swelling in your ankles.  You have trouble with your vision. SEEK IMMEDIATE MEDICAL CARE IF:  You develop a severe headache or confusion.  You have unusual weakness, numbness, or feel faint.  You have severe chest or abdominal pain.  You vomit repeatedly.  You have trouble breathing. MAKE SURE YOU:   Understand these instructions.  Will watch your condition.  Will get help right away if you are not doing well or get worse. Document Released: 01/01/2005 Document Revised: 05/18/2013 Document Reviewed: 10/24/2012 [facility] Patient Information 2015 [place], [place]. This information is not intended to replace advice given to you by your health care provider. Make sure you discuss any questions you have with your health care provider.

## 2020-04-26 LAB — COMPREHENSIVE METABOLIC PANEL
ALT: 22 IU/L (ref 0–32)
AST: 18 IU/L (ref 0–40)
Albumin/Globulin Ratio: 1.9 (ref 1.2–2.2)
Albumin: 4.4 g/dL (ref 3.8–4.8)
Alkaline Phosphatase: 82 IU/L (ref 44–121)
BUN/Creatinine Ratio: 29 — ABNORMAL HIGH (ref 12–28)
BUN: 25 mg/dL (ref 8–27)
Bilirubin Total: <0.2 mg/dL (ref 0.0–1.2)
CO2: 23 mmol/L (ref 20–29)
Calcium: 9.7 mg/dL (ref 8.7–10.3)
Chloride: 99 mmol/L (ref 96–106)
Creatinine, Ser: 0.86 mg/dL (ref 0.57–1.00)
Globulin, Total: 2.3 g/dL (ref 1.5–4.5)
Glucose: 122 mg/dL — ABNORMAL HIGH (ref 65–99)
Potassium: 4.2 mmol/L (ref 3.5–5.2)
Sodium: 136 mmol/L (ref 134–144)
Total Protein: 6.7 g/dL (ref 6.0–8.5)
eGFR: 76 mL/min/{1.73_m2} (ref >59)

## 2020-04-26 LAB — CBC WITH DIFFERENTIAL/PLATELET
Basophils Absolute: 0.1 10*3/uL (ref 0.0–0.2)
Basos: 1 %
EOS (ABSOLUTE): 0.1 10*3/uL (ref 0.0–0.4)
Eos: 1 %
Hematocrit: 38 % (ref 34.0–46.6)
Hemoglobin: 12.5 g/dL (ref 11.1–15.9)
Immature Grans (Abs): 0 10*3/uL (ref 0.0–0.1)
Immature Granulocytes: 0 %
Lymphocytes Absolute: 2.7 10*3/uL (ref 0.7–3.1)
Lymphs: 38 %
MCH: 30.3 pg (ref 26.6–33.0)
MCHC: 32.9 g/dL (ref 31.5–35.7)
MCV: 92 fL (ref 79–97)
Monocytes Absolute: 0.5 10*3/uL (ref 0.1–0.9)
Monocytes: 7 %
Neutrophils Absolute: 3.9 10*3/uL (ref 1.4–7.0)
Neutrophils: 53 %
Platelets: 344 10*3/uL (ref 150–450)
RBC: 4.13 x10E6/uL (ref 3.77–5.28)
RDW: 12.1 % (ref 11.7–15.4)
WBC: 7.3 10*3/uL (ref 3.4–10.8)

## 2020-04-26 LAB — APTT: aPTT: 27 s (ref 24–33)

## 2020-04-26 LAB — PROTIME-INR
INR: 0.9 (ref 0.9–1.2)
Prothrombin Time: 9.8 s (ref 9.1–12.0)

## 2020-04-27 ENCOUNTER — Institutional Professional Consult (permissible substitution): Payer: BC Managed Care – PPO | Admitting: Medical

## 2020-05-02 ENCOUNTER — Other Ambulatory Visit: Payer: Self-pay | Admitting: Medical

## 2020-05-02 NOTE — Telephone Encounter (Signed)
Is this okay to refill? 

## 2020-05-03 ENCOUNTER — Other Ambulatory Visit: Payer: Self-pay | Admitting: Medical

## 2020-05-04 ENCOUNTER — Encounter: Payer: Self-pay | Admitting: Family Medicine

## 2020-05-04 ENCOUNTER — Ambulatory Visit (INDEPENDENT_AMBULATORY_CARE_PROVIDER_SITE_OTHER): Payer: BC Managed Care – PPO | Admitting: Family Medicine

## 2020-05-04 ENCOUNTER — Other Ambulatory Visit: Payer: Self-pay

## 2020-05-04 VITALS — BP 104/70 | HR 74 | Ht 66.5 in | Wt 208.6 lb

## 2020-05-04 DIAGNOSIS — M62838 Other muscle spasm: Secondary | ICD-10-CM

## 2020-05-04 DIAGNOSIS — R519 Headache, unspecified: Secondary | ICD-10-CM | POA: Diagnosis not present

## 2020-05-04 DIAGNOSIS — Z23 Encounter for immunization: Secondary | ICD-10-CM

## 2020-05-04 MED ORDER — CYCLOBENZAPRINE HCL 10 MG PO TABS: 10.0000 mg | ORAL_TABLET | Freq: Every evening | ORAL | 0 refills | Status: DC | PRN

## 2020-05-04 MED ORDER — METHOCARBAMOL 500 MG PO TABS: 500.0000 mg | ORAL_TABLET | Freq: Three times a day (TID) | ORAL | 0 refills | Status: DC | PRN

## 2020-05-04 NOTE — Progress Notes (Signed)
Chief Complaint  Patient presents with  . Neck Pain    About 2 weeks ago she felt like she had a "crick" in her neck. Now it's feeling like someone is stabbing an ice pic into the right side of her head. Monday night was really bad, she took an old gabapentin. Yesterday she was goos, no neck pain, no head pain. Last night around midnight she woke up with the stabbing pain in the head again. Looked it up online and thinks she has something called "ice pic headaches." Having hip replacement 5/4 and wants to make sure she is ok and ok to have surgery. Also men   Pain started after spreading pine needles the day before.  It has been hurting on the right side ever since (about 2 weeks ago). Pain started in the neck, but is now also having pain in the posterior head/scalp.  She had Gabapentin left from a hip replacement in 2019.  She took it the other day (because her mother takes it for trigeminal neuralgia, thought it might help with her head pain), went to bed, and was fine yesterday. Last night she woke up at midnight with stabbing pain on the rigt side posteriorly in the skull, extends inferiorly to the neck.  She takes meloxicam daily for arthritis.  She sees Dr. Nelva Bush for pain management, does cortisone injections periodically for her neck (and hip and other locations). Had a reaction after the last shot--flushing.   She denies any radiation of the pain into RUE, no numbness, tingling or weakness.   PMH, PSH, SH reviewed  Outpatient Encounter Medications as of 05/04/2020  Medication Sig  . amitriptyline (ELAVIL) 10 MG tablet Take 10 mg by mouth at bedtime.  . cholecalciferol (VITAMIN D3) 25 MCG (1000 UNIT) tablet Take 1,000 Units by mouth daily.  . Cyanocobalamin (VITAMIN B 12 PO) Take 1 tablet by mouth daily.  Marland Kitchen dexlansoprazole (DEXILANT) 60 MG capsule TAKE 1 CAPSULE(60 MG) BY MOUTH DAILY  . DULoxetine (CYMBALTA) 60 MG capsule TAKE ONE CAPSULE BY MOUTH TWICE DAILY  . ezetimibe (ZETIA) 10  MG tablet TAKE 1 TABLET(10 MG) BY MOUTH EVERY MORNING  . famotidine (PEPCID) 20 MG tablet Take 1 tablet (20 mg total) by mouth at bedtime.  . Magnesium 400 MG TABS Take 1 capsule by mouth 2 (two) times daily.  . meloxicam (MOBIC) 15 MG tablet TAKE 1 TABLET(15 MG) BY MOUTH DAILY  . MULTIPLE VITAMIN PO Take 1 tablet by mouth daily.  . psyllium (METAMUCIL) 58.6 % powder Take by mouth. 2 teaspoons nightly  . TRULANCE 3 MG TABS TAKE 1 TABLET BY MOUTH DAILY  . valACYclovir (VALTREX) 500 MG tablet TAKE 1 TABLET(500 MG) BY MOUTH DAILY (Patient taking differently: Take 500 mg by mouth daily.)   No facility-administered encounter medications on file as of 05/04/2020.    Allergies  Allergen Reactions  . Cephalosporins Diarrhea  . Codeine Itching  . Latex Itching  . Pravastatin     Myalgias  . Simvastatin     myalgias  . Sulfa Antibiotics Diarrhea   ROS: no fever, chlls, URI symptoms, dizziness, chest pain, palpitations, GI or urinary complaints. No bleeding, bruising, rashes.  L hip pain (surgery planned for May) Sleeping well on elavil, +some dry mouth. Urinary frequency, improved on elavil. R-sided headache and neck pain per HPI.   PHYSICAL EXAM:  BP 104/70   Pulse 74   Ht 5' 6.5" (1.689 m)   Wt 208 lb 9.6 oz (94.6 kg)  LMP 12/31/2019   BMI 33.16 kg/m   Pleasant, well-appearing female, in no distress HEENT: conjunctiva and sclera are clear, EOMI. Head is nontender. Neck: FROM. Tender at lower c-spine centrally. Pain with left ear to shoulder stretch. Tender with trigger point in right trapezius at shoulder, and also has trigger point at lateral neck (SCM). nontender and no rash on scalp DTR's 2+ and symmetric, normal strength and sensation. Normal gait  ASSESSMENT/PLAN:  Muscle spasms of neck - rec heat, stretches (shown), massage.  Robaxin during day, flexeril at night.  Risks/SE reviewed - Plan: methocarbamol (ROBAXIN) 500 MG tablet, cyclobenzaprine (FLEXERIL) 10 MG  tablet  Need for COVID-19 vaccine - Plan: PFIZER Comirnaty(GRAY TOP)COVID-19 Vaccine  Nonintractable headache, unspecified chronicity pattern, unspecified headache type - suspect her posterior scalp pain/headaches are related to the trigger points and muscle spasm. Heat/stretch/muscle relaxant. f/u Dr. Nelva Bush if not better   I spent 39 minutes dedicated to the care of this patient, including pre-visit review of records, face to face time, post-visit ordering of testing and documentation.   Take methocarbamol (robaxin) every 8 hours as needed.. This shouldn't be sedating, so okay to use during the day.  If this is only slightly effective, then instead of taking this at night, use the flexeril at bedtime.  This is sedating, and shouldn't be used during the day.  Do not take both muscle relaxants together.  I recommend starting the flexeril at just 1/2 tablet at bedtime, since you are also taking amitriptylene.  If this isn't too sedating, and if it didn't help your head/neck enough, then you can try the full tablet of flexeril.  Consider seeing Dr. Nelva Bush if pain persists/worsens  Do the stretches as shown--2-3 times/day, after heat. Chin to chest, ear to shoulder, and looking over your shoulder are the 3 stretches.

## 2020-05-04 NOTE — Patient Instructions (Signed)
  Take methocarbamol (robaxin) every 8 hours as needed.. This shouldn't be sedating, so okay to use during the day.  If this is only slightly effective, then instead of taking this at night, use the flexeril at bedtime.  This is sedating, and shouldn't be used during the day.  Do not take both muscle relaxants together.  I recommend starting the flexeril at just 1/2 tablet at bedtime, since you are also taking amitriptylene.  If this isn't too sedating, and if it didn't help your head/neck enough, then you can try the full tablet of flexeril.  Consider seeing Dr. Nelva Bush if pain persists/worsens  Do the stretches as shown--2-3 times/day, after heat. Chin to chest, ear to shoulder, and looking over your shoulder are the 3 stretches.

## 2020-05-17 DIAGNOSIS — M1612 Unilateral primary osteoarthritis, left hip: Secondary | ICD-10-CM | POA: Diagnosis not present

## 2020-05-29 ENCOUNTER — Other Ambulatory Visit: Payer: Self-pay | Admitting: Medical

## 2020-06-21 DIAGNOSIS — Z471 Aftercare following joint replacement surgery: Secondary | ICD-10-CM | POA: Diagnosis not present

## 2020-06-21 DIAGNOSIS — Z96642 Presence of left artificial hip joint: Secondary | ICD-10-CM | POA: Diagnosis not present

## 2020-06-27 DIAGNOSIS — B078 Other viral warts: Secondary | ICD-10-CM | POA: Diagnosis not present

## 2020-06-27 DIAGNOSIS — L821 Other seborrheic keratosis: Secondary | ICD-10-CM | POA: Diagnosis not present

## 2020-06-27 DIAGNOSIS — L570 Actinic keratosis: Secondary | ICD-10-CM | POA: Diagnosis not present

## 2020-06-27 DIAGNOSIS — Z85828 Personal history of other malignant neoplasm of skin: Secondary | ICD-10-CM | POA: Diagnosis not present

## 2020-07-06 DIAGNOSIS — L570 Actinic keratosis: Secondary | ICD-10-CM | POA: Diagnosis not present

## 2020-07-06 DIAGNOSIS — C44729 Squamous cell carcinoma of skin of left lower limb, including hip: Secondary | ICD-10-CM | POA: Diagnosis not present

## 2020-07-06 DIAGNOSIS — L820 Inflamed seborrheic keratosis: Secondary | ICD-10-CM | POA: Diagnosis not present

## 2020-07-07 DIAGNOSIS — D1722 Benign lipomatous neoplasm of skin and subcutaneous tissue of left arm: Secondary | ICD-10-CM | POA: Diagnosis not present

## 2020-07-07 DIAGNOSIS — M25512 Pain in left shoulder: Secondary | ICD-10-CM | POA: Diagnosis not present

## 2020-07-19 ENCOUNTER — Other Ambulatory Visit: Payer: Self-pay | Admitting: Medical

## 2020-07-31 ENCOUNTER — Other Ambulatory Visit: Payer: Self-pay | Admitting: Medical

## 2020-08-01 NOTE — Telephone Encounter (Signed)
Has an appt in november

## 2020-08-02 DIAGNOSIS — R102 Pelvic and perineal pain: Secondary | ICD-10-CM | POA: Diagnosis not present

## 2020-08-02 DIAGNOSIS — R3915 Urgency of urination: Secondary | ICD-10-CM | POA: Diagnosis not present

## 2020-08-02 DIAGNOSIS — R350 Frequency of micturition: Secondary | ICD-10-CM | POA: Diagnosis not present

## 2020-08-03 ENCOUNTER — Ambulatory Visit: Payer: BC Managed Care – PPO | Admitting: Medical

## 2020-08-11 ENCOUNTER — Ambulatory Visit (INDEPENDENT_AMBULATORY_CARE_PROVIDER_SITE_OTHER): Payer: BC Managed Care – PPO | Admitting: Family Medicine

## 2020-08-11 ENCOUNTER — Other Ambulatory Visit: Payer: Self-pay

## 2020-08-11 VITALS — BP 96/66 | HR 92 | Temp 98.6°F | Wt 209.6 lb

## 2020-08-11 DIAGNOSIS — R399 Unspecified symptoms and signs involving the genitourinary system: Secondary | ICD-10-CM | POA: Diagnosis not present

## 2020-08-11 DIAGNOSIS — Z1152 Encounter for screening for COVID-19: Secondary | ICD-10-CM

## 2020-08-11 DIAGNOSIS — D0461 Carcinoma in situ of skin of right upper limb, including shoulder: Secondary | ICD-10-CM | POA: Diagnosis not present

## 2020-08-11 DIAGNOSIS — N3001 Acute cystitis with hematuria: Secondary | ICD-10-CM | POA: Diagnosis not present

## 2020-08-11 DIAGNOSIS — L570 Actinic keratosis: Secondary | ICD-10-CM | POA: Diagnosis not present

## 2020-08-11 DIAGNOSIS — D485 Neoplasm of uncertain behavior of skin: Secondary | ICD-10-CM | POA: Diagnosis not present

## 2020-08-11 DIAGNOSIS — D0462 Carcinoma in situ of skin of left upper limb, including shoulder: Secondary | ICD-10-CM | POA: Diagnosis not present

## 2020-08-11 LAB — POC COVID19 BINAXNOW: SARS Coronavirus 2 Ag: NEGATIVE

## 2020-08-11 LAB — POCT URINALYSIS DIP (PROADVANTAGE DEVICE)
Glucose, UA: NEGATIVE mg/dL
Ketones, POC UA: NEGATIVE mg/dL
Nitrite, UA: NEGATIVE
Protein Ur, POC: 100 mg/dL — AB
Specific Gravity, Urine: 1.015
Urobilinogen, Ur: 0.2
pH, UA: 6 (ref 5.0–8.0)

## 2020-08-11 MED ORDER — AMOXICILLIN-POT CLAVULANATE 875-125 MG PO TABS: 1.0000 | ORAL_TABLET | Freq: Two times a day (BID) | ORAL | 0 refills | Status: DC

## 2020-08-11 NOTE — Progress Notes (Signed)
   Subjective:    Patient ID: Kristi Avila, female    DOB: 05/04/1955, 65 y.o.   MRN: [id number]  HPI She is here for consult concerning continued difficulty with frequency, urgency and dysuria.  She was seen in an urgent care center on the 19th, culture was taken and she was treated with Macrobid.  She continues to have difficulty with the above symptoms.  She also complains of some fatigue and malaise.   Review of Systems     Objective:   Physical Exam Alert and in no distress.  COVID test is negative.  Urine microscopic showed contamination as well as bacteria red and white cells. The medical record was reviewed and she is sensitive to Augmentin.  She was also sensitive to Empire however she did not respond to that.      Assessment & Plan:  UTI symptoms - Plan: POCT Urinalysis DIP (Proadvantage Device)  Acute cystitis with hematuria - Plan: amoxicillin-clavulanate (AUGMENTIN) 875-125 MG tablet  Encounter for screening for COVID-19 - Plan: POC COVID-19 BinaxNow She will return here in 2 weeks for recheck on her urine because of the hematuria.  Also recommend she try Azo Standard.

## 2020-08-12 ENCOUNTER — Telehealth: Payer: Self-pay

## 2020-08-12 NOTE — Telephone Encounter (Signed)
Pt. Called and LM stating that the augmentin you called in for her yesterday was causing her nausea wanted to know if she could get something else called in.

## 2020-08-14 ENCOUNTER — Other Ambulatory Visit: Payer: Self-pay | Admitting: Medical

## 2020-08-14 ENCOUNTER — Other Ambulatory Visit: Payer: Self-pay | Admitting: Family Medicine

## 2020-08-14 MED ORDER — NITROFURANTOIN MONOHYD MACRO 100 MG PO CAPS
100.0000 mg | ORAL_CAPSULE | Freq: Two times a day (BID) | ORAL | 0 refills | Status: DC
Start: 2020-08-14 — End: 2020-09-08

## 2020-08-15 NOTE — Telephone Encounter (Signed)
[facility] is requesting to fill pt meloxicam. Please advise KH 

## 2020-08-25 ENCOUNTER — Other Ambulatory Visit: Payer: Self-pay

## 2020-08-25 ENCOUNTER — Other Ambulatory Visit (INDEPENDENT_AMBULATORY_CARE_PROVIDER_SITE_OTHER): Payer: BC Managed Care – PPO

## 2020-08-25 DIAGNOSIS — R399 Unspecified symptoms and signs involving the genitourinary system: Secondary | ICD-10-CM

## 2020-08-25 LAB — POCT URINALYSIS DIP (PROADVANTAGE DEVICE)
Bilirubin, UA: NEGATIVE
Blood, UA: NEGATIVE
Glucose, UA: NEGATIVE mg/dL
Ketones, POC UA: NEGATIVE mg/dL
Leukocytes, UA: NEGATIVE
Nitrite, UA: NEGATIVE
Protein Ur, POC: NEGATIVE mg/dL
Specific Gravity, Urine: 1.01
Urobilinogen, Ur: NEGATIVE
pH, UA: 6 (ref 5.0–8.0)

## 2020-08-27 ENCOUNTER — Other Ambulatory Visit: Payer: Self-pay | Admitting: Medical

## 2020-09-08 ENCOUNTER — Encounter: Payer: Self-pay | Admitting: Medical

## 2020-09-08 ENCOUNTER — Other Ambulatory Visit: Payer: Self-pay

## 2020-09-08 ENCOUNTER — Telehealth (INDEPENDENT_AMBULATORY_CARE_PROVIDER_SITE_OTHER): Payer: BC Managed Care – PPO | Admitting: Medical

## 2020-09-08 VITALS — Wt 205.0 lb

## 2020-09-08 DIAGNOSIS — F5104 Psychophysiologic insomnia: Secondary | ICD-10-CM | POA: Diagnosis not present

## 2020-09-08 DIAGNOSIS — T50905A Adverse effect of unspecified drugs, medicaments and biological substances, initial encounter: Secondary | ICD-10-CM | POA: Diagnosis not present

## 2020-09-08 DIAGNOSIS — R682 Dry mouth, unspecified: Secondary | ICD-10-CM | POA: Diagnosis not present

## 2020-09-08 DIAGNOSIS — N3281 Overactive bladder: Secondary | ICD-10-CM

## 2020-09-08 DIAGNOSIS — G479 Sleep disorder, unspecified: Secondary | ICD-10-CM | POA: Diagnosis not present

## 2020-09-08 MED ORDER — BELSOMRA 10 MG PO TABS: 10.0000 mg | ORAL_TABLET | Freq: Every evening | ORAL | 0 refills | Status: DC | PRN

## 2020-09-08 NOTE — Progress Notes (Signed)
Subjective:     Patient ID: Kristi Avila, female   DOB: 06-02-1955, 65 y.o.   MRN: [id number]  This visit type was conducted due to national recommendations for restrictions regarding the COVID-19 Pandemic (e.g. social distancing) in an effort to limit this patient's exposure and mitigate transmission in our community.  Due to their co-morbid illnesses, this patient is at least at moderate risk for complications without adequate follow up.  This format is felt to be most appropriate for this patient at this time.    Documentation for virtual audio and video telecommunications through [place] encounter:  The patient was located at home. The provider was located in the office. The patient did consent to this visit and is aware of possible charges through their insurance for this visit.  The other persons participating in this telemedicine service were none. Time spent on call was 20 minutes and in review of previous records 20 minutes total.  This virtual service is not related to other E/M service within previous 7 days.   HPI Chief Complaint  Patient presents with   trouble sleeping.    Trouble sleeping was on amtripline for bladder relaxation. But she is about to go off this and would like something else to go on to help sleep   Virtual consult for trouble sleeping and dry mouth.  She thinks the amitriptyline is causing her a lot of dry mouth.  She has been using it for both overactive bladder and sleep but it does not seem to help either 1.  In the past Xanax has worked well for sleep but we have talked about the addictive potential of this.  She has tried other sleep aids as well.  She would like something to help with sleep.  No prior sleep study.  Sometimes she has trouble getting to sleep but often she has trouble waking back up and cannot get back to sleep.  She does not describe herself as a Research officer, trade union or waking up worrying about things.  She says she does limit caffeine  use.  OAB - has used vesicare, flomase in the past. She still has urine frequency in the day but not at nivht.   Drinks a lot of water throughout the day.   Does get up mabye 3 times to urine in the night.  She has seen urology in the past and did not seem to have much improvement on some of the typical overactive bladder medications  She uses a mouthguard at night to protect her teeth and grinding.  Exercise: - not a lot  No other aggravating or relieving factors. No other complaint.  Past Medical History:  Diagnosis Date   Arthritis    Barrett's esophagus with esophagitis 07/23/12   EGD: CLO test results negative. pathology: [facility]: Repeat EGD 3 years   Blood transfusion without reported diagnosis    remote past   Depression    Family history of anesthesia complication    mother trouble waking up   Frequency of urination    GERD (gastroesophageal reflux disease)    h/o peptic ulcer   High cholesterol    HSV-2 infection    anal   Nocturia    Obesity    Overactive bladder    Reflux    Urgency of urination    Current Outpatient Medications on File Prior to Visit  Medication Sig Dispense Refill   cholecalciferol (VITAMIN D3) 25 MCG (1000 UNIT) tablet Take 1,000 Units by mouth daily.  Cyanocobalamin (VITAMIN B 12 PO) Take 1 tablet by mouth daily.     DEXILANT 60 MG capsule TAKE 1 CAPSULE BY MOUTH EVERY DAY 60 capsule 2   DULoxetine (CYMBALTA) 60 MG capsule TAKE ONE CAPSULE BY MOUTH TWICE DAILY 180 capsule 0   ezetimibe (ZETIA) 10 MG tablet TAKE 1 TABLET(10 MG) BY MOUTH EVERY MORNING 90 tablet 0   famotidine (PEPCID) 20 MG tablet Take 1 tablet (20 mg total) by mouth at bedtime. 30 tablet 5   Magnesium 400 MG TABS Take 1 capsule by mouth 2 (two) times daily.     meloxicam (MOBIC) 15 MG tablet TAKE 1 TABLET(15 MG) BY MOUTH DAILY 30 tablet 1   MULTIPLE VITAMIN PO Take 1 tablet by mouth daily.     TRULANCE 3 MG TABS TAKE 1 TABLET BY MOUTH DAILY 30 tablet 2   valACYclovir  (VALTREX) 500 MG tablet TAKE 1 TABLET(500 MG) BY MOUTH DAILY (Patient taking differently: Take 500 mg by mouth daily.) 90 tablet 3   psyllium (METAMUCIL) 58.6 % powder Take by mouth. 2 teaspoons nightly (Patient not taking: Reported on 09/08/2020)     No current facility-administered medications on file prior to visit.     Review of Systems As in subjective    Objective:   Physical Exam Due to coronavirus pandemic stay at home measures, patient visit was virtual and they were not examined in person.   Wt 205 lb (93 kg)   LMP 12/31/2019   BMI 32.59 kg/m    Gen: wd, wn nad Answers questions appropriately      Assessment:     Encounter Diagnoses  Name Primary?   Sleep disturbance Yes   Chronic insomnia    Dry mouth    Adverse effect of drug, initial encounter    Overactive bladder        Plan:     Sleep disturbance, chronic insomnia-consider sleep study.  Discussed her long history of sleep issues.  We discussed sleep hygiene, other techniques to help get sleep.  Begin trial of Belsomra as below.  Discussed risk and benefits of medication.  Adverse drug reaction, dry mouth-likely due to amitriptyline which she recently stopped.  OAB -not much improved on amitriptyline.  She has used several other medicines in the past.  Begin trial of Detrol.  [name] was seen today for trouble sleeping..  Diagnoses and all orders for this visit:  Sleep disturbance  Chronic insomnia  Dry mouth  Adverse effect of drug, initial encounter  Overactive bladder  Other orders -     Suvorexant (BELSOMRA) 10 MG TABS; Take 10 mg by mouth at bedtime as needed. -     tolterodine (DETROL LA) 2 MG 24 hr capsule; Take 1 capsule (2 mg total) by mouth daily.  F/u with call report in 2 wk

## 2020-09-09 ENCOUNTER — Other Ambulatory Visit: Payer: Self-pay | Admitting: Medical

## 2020-09-09 DIAGNOSIS — F5104 Psychophysiologic insomnia: Secondary | ICD-10-CM | POA: Insufficient documentation

## 2020-09-09 DIAGNOSIS — R682 Dry mouth, unspecified: Secondary | ICD-10-CM | POA: Insufficient documentation

## 2020-09-09 DIAGNOSIS — T50905A Adverse effect of unspecified drugs, medicaments and biological substances, initial encounter: Secondary | ICD-10-CM | POA: Insufficient documentation

## 2020-09-09 MED ORDER — TOLTERODINE TARTRATE ER 2 MG PO CP24: 2.0000 mg | ORAL_CAPSULE | Freq: Every day | ORAL | 1 refills | Status: DC

## 2020-09-12 NOTE — Telephone Encounter (Signed)
No I don't want 90 day refill yet until she tries it and sees if it works ok for her and not too expensive

## 2020-09-13 ENCOUNTER — Other Ambulatory Visit: Payer: Self-pay | Admitting: Medical

## 2020-09-13 MED ORDER — TEMAZEPAM 7.5 MG PO CAPS: 7.5000 mg | ORAL_CAPSULE | Freq: Every evening | ORAL | 0 refills | Status: DC | PRN

## 2020-09-13 MED ORDER — FLUCONAZOLE 100 MG PO TABS: 100.0000 mg | ORAL_TABLET | Freq: Every day | ORAL | 0 refills | Status: AC

## 2020-09-16 ENCOUNTER — Other Ambulatory Visit: Payer: Self-pay

## 2020-09-16 ENCOUNTER — Telehealth: Payer: Self-pay

## 2020-09-16 ENCOUNTER — Telehealth (INDEPENDENT_AMBULATORY_CARE_PROVIDER_SITE_OTHER): Payer: BC Managed Care – PPO | Admitting: Family Medicine

## 2020-09-16 ENCOUNTER — Encounter: Payer: Self-pay | Admitting: Family Medicine

## 2020-09-16 VITALS — BP 105/81 | HR 84 | Temp 99.5°F | Ht 66.5 in | Wt 204.0 lb

## 2020-09-16 DIAGNOSIS — U071 COVID-19: Secondary | ICD-10-CM

## 2020-09-16 MED ORDER — MOLNUPIRAVIR EUA 200MG CAPSULE: 4.0000 | ORAL_CAPSULE | Freq: Two times a day (BID) | ORAL | 0 refills | Status: DC

## 2020-09-16 MED ORDER — BENZONATATE 200 MG PO CAPS: 200.0000 mg | ORAL_CAPSULE | Freq: Three times a day (TID) | ORAL | 0 refills | Status: DC | PRN

## 2020-09-16 NOTE — Telephone Encounter (Signed)
Recv'd rejections for Belsomra & temazepam.  I called pt's insurance company and neither are covered.  Pt must try step therapy of all covered generics first.  Ramelteon/Rozerem, Zolpidem, Zaleplon or Eszopiclone, do you want to switch?

## 2020-09-16 NOTE — Patient Instructions (Addendum)
Don't use benadryl and Nyquil together. Don't use any tylenol along with Nyquil, since it has the same ingredient.  STOP NYQUIL. Instead take Mucinex DM 12 hour tablet twice daily. You may continue to use tylenol as needed for pain or fever (don't take extra, or with any Nyquil). You may continue to use Sudafed PE if needed for sinus pain and postnasal drainage. Cut back on this at night if it causes insomnia, and cut back if palpitations, or other side effects. You may take Benadryl at bedtime if needed for sleep (just no Nyquil with it).  Contact us or e-visit through [facility] if you develop high fever, worsening shortness of breath, chest pain, pain with breathing, or other new symptoms.  Wednesday was day 0.  Isolate completely for days 1-5 (Thursday through  Monday). On Tuesday (day 6), if you no longer have fever, and if respiratory symptoms are improving, you may leave isolation but need to wear your mask 100% of the time for days 6-10. If on Tuesday you still have significant cough or fever, then you need to stay in isolation for the full 10 days.  Use the benzonatate if needed for cough, not adequately controlled with the Mucinex-DM.  Technically you don't meet criteria for anti-viral medications.  If severity of symptoms worsen despite the over-the-counter and cough medications, you may start it as long as you start it during the first 5 days of symptoms.

## 2020-09-16 NOTE — Progress Notes (Signed)
Start time: 10:46 End time: 11:10  Virtual Visit via Video Note  I connected with Kristi Avila on 09/16/20 by a video enabled telemedicine application and verified that I am speaking with the correct person using two identifiers.  Location: Patient: home Provider: office   I discussed the limitations of evaluation and management by telemedicine and the availability of in person appointments. The patient expressed understanding and agreed to proceed.  History of Present Illness:  Chief Complaint  Patient presents with   Covid Positive    VIRTUAL covid positive yesterday Symptoms started Wed night. Cough and low grade temp. Body aches and chills, fatigue.    8/31 started feeling achey and fatigued.  Albin Fischer felt achey, flu-like, coughing Now moving down into her throat, raspy. Overwhelming achiness, everything hurts. Meloxicam and tylenol helps some. Last night she took Nyquil and benadryl. Woke up at 2am and took more tylenol.  Has headache behind her eyes, took a decongestant just recently, unsure if helped. Nasal drainage is white, as is the phlegm she cough up. Some runny nose.  OTC Meds taken:  tylenol, Benadryl last night, and Vick's Nyquil cold and flu--acetaminophen, dextromethorphan, doxyalmine.  Had COVID vaccines x 4  PMH, PSH, SH reviewed  Outpatient Encounter Medications as of 09/16/2020  Medication Sig Note   acetaminophen (TYLENOL) 500 MG tablet Take 1,000 mg by mouth every 6 (six) hours as needed. 09/16/2020: Last dose 2am   cholecalciferol (VITAMIN D3) 25 MCG (1000 UNIT) tablet Take 1,000 Units by mouth daily.    Cyanocobalamin (VITAMIN B 12 PO) Take 1 tablet by mouth daily.    DEXILANT 60 MG capsule TAKE 1 CAPSULE BY MOUTH EVERY DAY    DULoxetine (CYMBALTA) 60 MG capsule TAKE ONE CAPSULE BY MOUTH TWICE DAILY    ELDERBERRY PO Take 1 capsule by mouth daily.    ezetimibe (ZETIA) 10 MG tablet TAKE 1 TABLET(10 MG) BY MOUTH EVERY MORNING    famotidine (PEPCID)  20 MG tablet Take 1 tablet (20 mg total) by mouth at bedtime.    fluconazole (DIFLUCAN) 100 MG tablet Take 1 tablet (100 mg total) by mouth daily.    Magnesium 400 MG TABS Take 1 capsule by mouth 2 (two) times daily.    meloxicam (MOBIC) 15 MG tablet TAKE 1 TABLET(15 MG) BY MOUTH DAILY    MULTIPLE VITAMIN PO Take 1 tablet by mouth daily.    Multiple Vitamins-Minerals (EMERGEN-C IMMUNE PO) Take 1 capsule by mouth daily.    Phenyleph-Doxylamine-DM-APAP (NYQUIL SEVERE COLD/FLU) 5-6.25-10-325 MG/15ML LIQD Take 15 mLs by mouth.    TRULANCE 3 MG TABS TAKE 1 TABLET BY MOUTH DAILY    valACYclovir (VALTREX) 500 MG tablet TAKE 1 TABLET(500 MG) BY MOUTH DAILY (Patient taking differently: Take 500 mg by mouth daily.)    psyllium (METAMUCIL) 58.6 % powder Take by mouth. 2 teaspoons nightly (Patient not taking: No sig reported)    Suvorexant (BELSOMRA) 10 MG TABS Take 10 mg by mouth at bedtime as needed. (Patient not taking: Reported on 09/16/2020)    temazepam (RESTORIL) 7.5 MG capsule Take 1 capsule (7.5 mg total) by mouth at bedtime as needed for sleep. (Patient not taking: Reported on 09/16/2020)    tolterodine (DETROL LA) 2 MG 24 hr capsule Take 1 capsule (2 mg total) by mouth daily. (Patient not taking: Reported on 09/16/2020)    No facility-administered encounter medications on file as of 09/16/2020.   Allergies  Allergen Reactions   Cephalosporins Diarrhea   Codeine Itching  Latex Itching   Pravastatin     Myalgias   Simvastatin     myalgias   Sulfa Antibiotics Diarrhea   ROS: +F, headache, URI symptoms, myalgias per HPI. No chest pain, mild SOB comes and goes, no pain with breathing (just throat, and rattling in upper chest).  No n/v/d, rashes, bleeding or bruising. No urinary complaints or other symptoms.  See HPI    Observations/Objective:  BP 105/81   Pulse 84   Temp 99.5 F (37.5 C) (Temporal)   Ht 5' 6.5" (1.689 m)   Wt 204 lb (92.5 kg)   LMP 12/31/2019   BMI 32.43 kg/m    Well-appearing female, who sounds nasal/congested. Only rare cough during visit.   She is alert and oriented, cranial nerves grossly intact. She is speaking easily, comfortably, in no distress Exam is limited due to virtual nature of the visit.   Assessment and Plan:  COVID-19 virus infection - Plan: benzonatate (TESSALON) 200 MG capsule, molnupiravir EUA 200 mg CAPS  Pt requested paxlovid--due to meds (diflucan), prefer molnupiravir, cleaner drug. She technically is not high risk/candidate for antiviral meds.  Reviewed potential for rebound.. Pt wants to have on hand to avoid ER if worsening. Understand the need to start before 5 days. Counseled at length re: OTC meds (since has been taking too much tylenol and doubling up on sedating antihistamines. All questions answered. Will f/u if worse.   Don't use benadryl and Nyquil together. Don't use any tylenol along with Nyquil, since it has the same ingredient.  STOP NYQUIL. Instead take Mucinex DM 12 hour tablet twice daily. You may continue to use tylenol as needed for pain or fever (don't take extra, or with any Nyquil). You may continue to use Sudafed PE if needed for sinus pain and postnasal drainage. Cut back on this at night if it causes insomnia, and cut back if palpitations, or other side effects. You may take Benadryl at bedtime if needed for sleep (just no Nyquil with it).  Contact us or e-visit through [facility] if you develop high fever, worsening shortness of breath, chest pain, pain with breathing, or other new symptoms.  Wednesday was day 0.  Isolate completely for days 1-5 (Thursday through  Monday). On Tuesday (day 6), if you no longer have fever, and if respiratory symptoms are improving, you may leave isolation but need to wear your mask 100% of the time for days 6-10. If on Tuesday you still have significant cough or fever, then you need to stay in isolation for the full 10 days.    Follow Up Instructions:    I  discussed the assessment and treatment plan with the patient. The patient was provided an opportunity to ask questions and all were answered. The patient agreed with the plan and demonstrated an understanding of the instructions.   The patient was advised to call back or seek an in-person evaluation if the symptoms worsen or if the condition fails to improve as anticipated.  I spent 30 minutes dedicated to the care of this patient, including pre-visit review of records, face to face time, post-visit ordering of testing and documentation.    Vikki Ports, MD

## 2020-09-17 ENCOUNTER — Telehealth: Payer: Self-pay

## 2020-09-17 NOTE — Telephone Encounter (Signed)
P.A. denied like I figured pt's insurance doesn't cover any cough meds, pt was given discount card options yesterday

## 2020-09-17 NOTE — Telephone Encounter (Signed)
P.A. BENZONATATE, called pharmacy and authorized either '100mg'$  1-2 or '200mg'$  per Dr. Tomi Bamberger whichever is cheaper for pt, and gave pt info on [facility] before leaving office.  [facility], no covered alternatives, only can do P.A. but probably wouldn't be approved with P.A. but went ahead and completed

## 2020-09-19 ENCOUNTER — Other Ambulatory Visit: Payer: Self-pay | Admitting: Medical

## 2020-09-19 MED ORDER — RAMELTEON 8 MG PO TABS: 8.0000 mg | ORAL_TABLET | Freq: Every day | ORAL | 0 refills | Status: DC

## 2020-09-19 NOTE — Progress Notes (Signed)
Med change

## 2020-09-26 DIAGNOSIS — L570 Actinic keratosis: Secondary | ICD-10-CM | POA: Diagnosis not present

## 2020-09-26 DIAGNOSIS — D485 Neoplasm of uncertain behavior of skin: Secondary | ICD-10-CM | POA: Diagnosis not present

## 2020-09-26 DIAGNOSIS — Z85828 Personal history of other malignant neoplasm of skin: Secondary | ICD-10-CM | POA: Diagnosis not present

## 2020-09-26 DIAGNOSIS — L821 Other seborrheic keratosis: Secondary | ICD-10-CM | POA: Diagnosis not present

## 2020-09-26 DIAGNOSIS — D225 Melanocytic nevi of trunk: Secondary | ICD-10-CM | POA: Diagnosis not present

## 2020-09-26 DIAGNOSIS — L281 Prurigo nodularis: Secondary | ICD-10-CM | POA: Diagnosis not present

## 2020-09-26 DIAGNOSIS — L820 Inflamed seborrheic keratosis: Secondary | ICD-10-CM | POA: Diagnosis not present

## 2020-09-27 NOTE — Telephone Encounter (Signed)
Called pharmacy Rozerem went thru for $0 and pt picked up

## 2020-10-02 ENCOUNTER — Other Ambulatory Visit: Payer: Self-pay | Admitting: Family Medicine

## 2020-10-02 ENCOUNTER — Other Ambulatory Visit: Payer: Self-pay | Admitting: Medical

## 2020-10-03 NOTE — Telephone Encounter (Signed)
[facility] is requesting to fill pt Mobic. Please advise [facility]

## 2020-10-26 ENCOUNTER — Other Ambulatory Visit: Payer: Self-pay | Admitting: Medical

## 2020-10-26 ENCOUNTER — Other Ambulatory Visit: Payer: Self-pay | Admitting: Family Medicine

## 2020-11-07 ENCOUNTER — Other Ambulatory Visit: Payer: Self-pay | Admitting: Medical

## 2020-11-07 ENCOUNTER — Other Ambulatory Visit: Payer: Self-pay

## 2020-11-08 ENCOUNTER — Other Ambulatory Visit: Payer: BC Managed Care – PPO

## 2020-11-11 IMAGING — CR CHEST - 2 VIEW
2 series · 2 of 2 positions shown · non-contrast
Comparison: Chest radiograph 09/17/2013

CLINICAL DATA: Chest tightness and shortness of breath for 1 month,
nonsmoker

EXAM:
CHEST - 2 VIEW

[w chest pa]
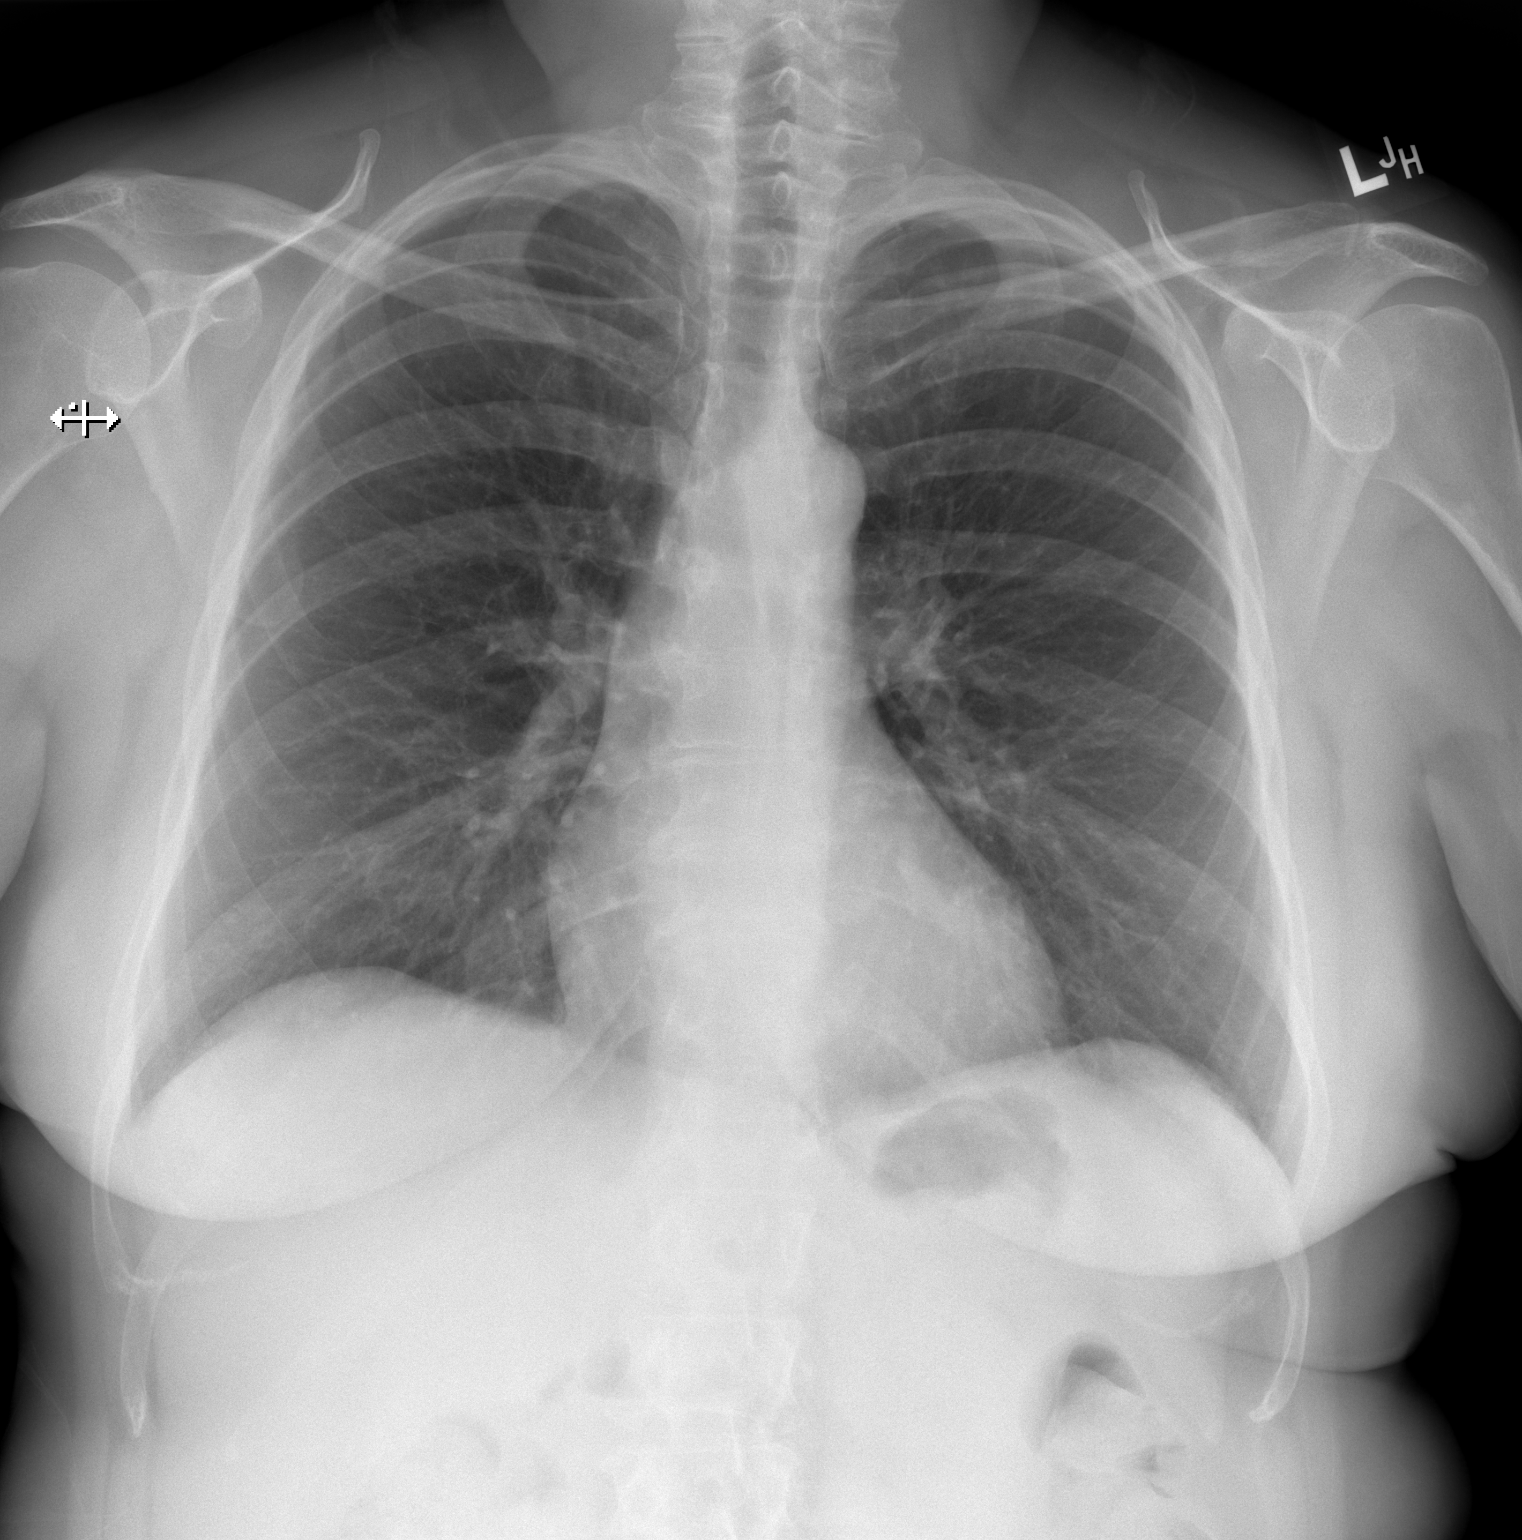

[w chest lat]
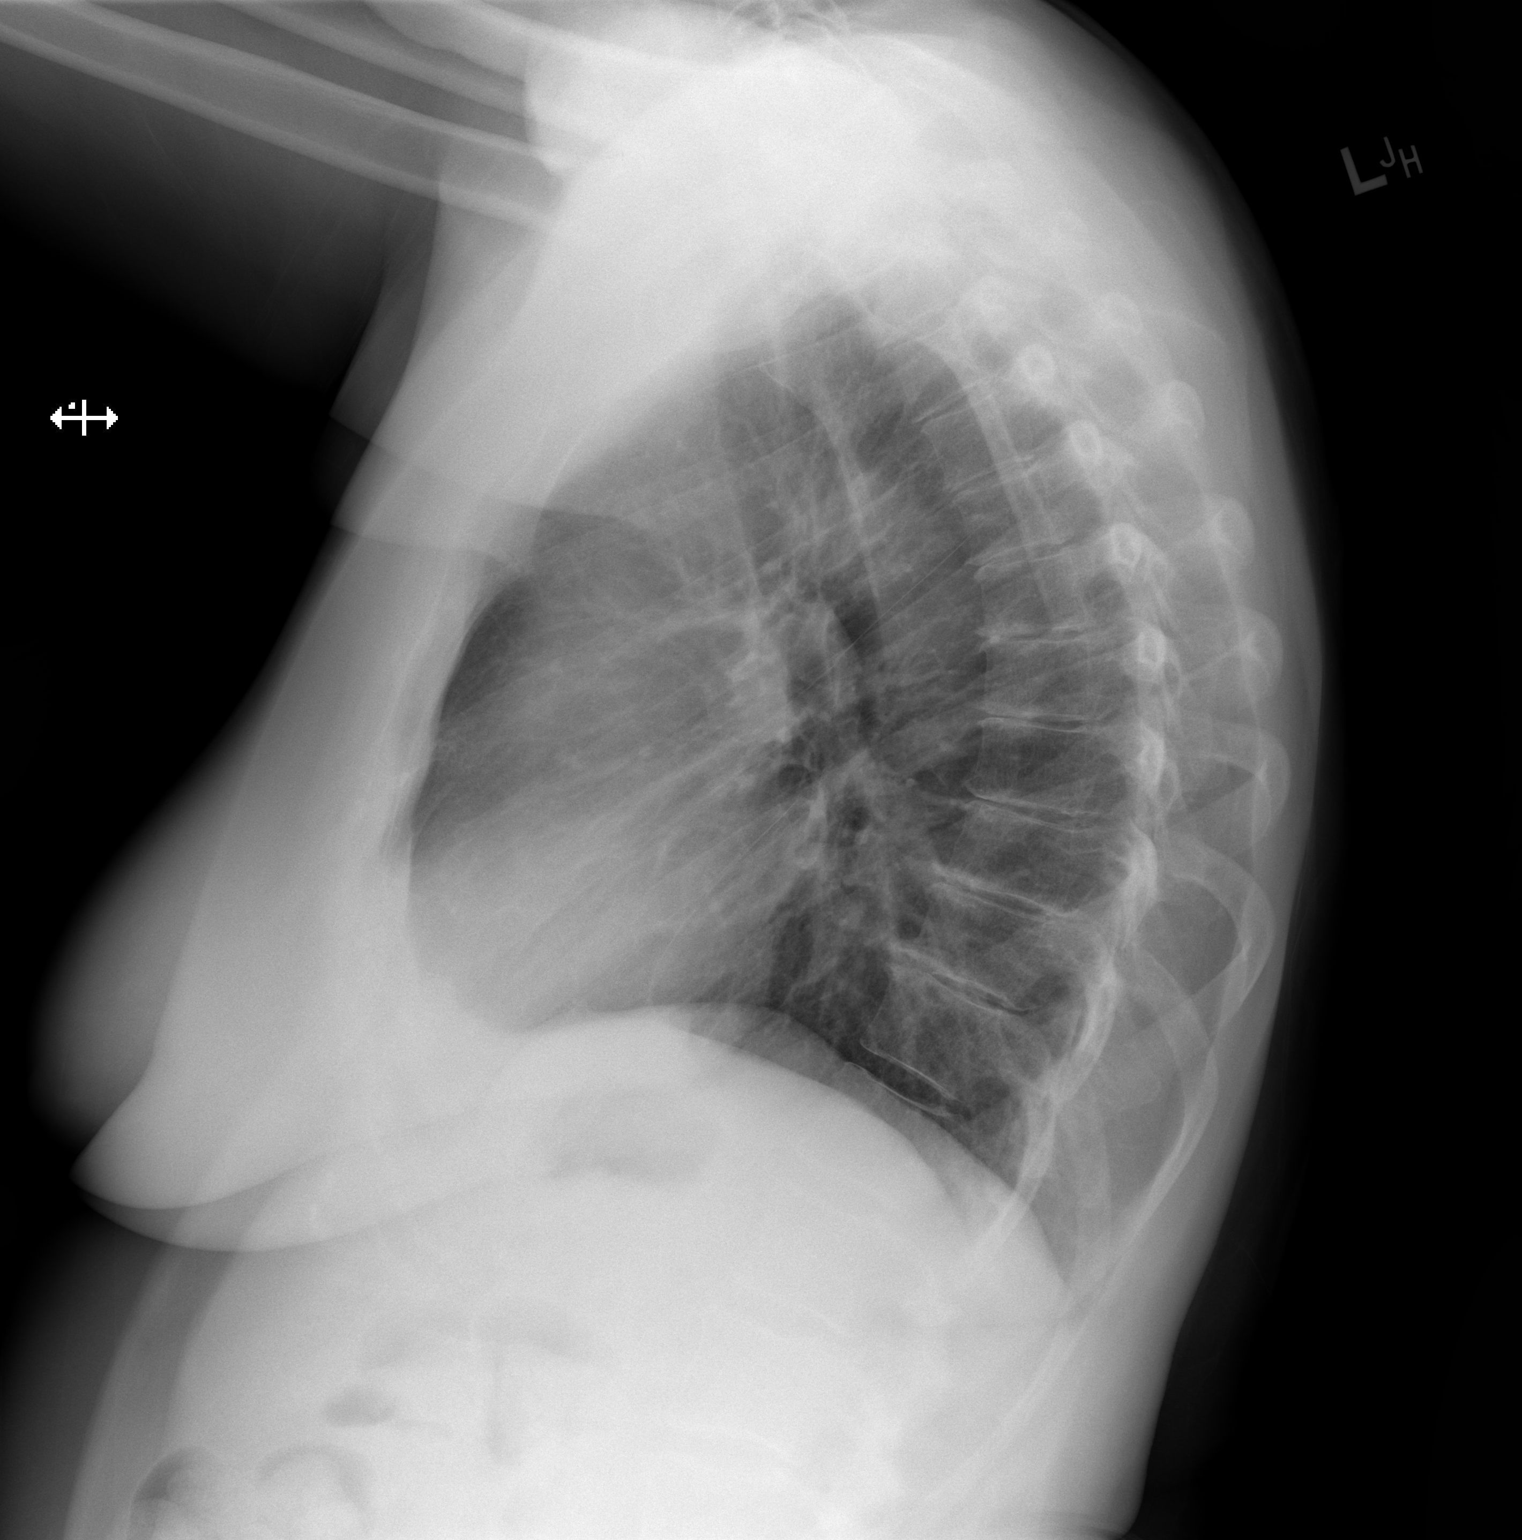

[2 of 2 positions shown; findings below may reference images not displayed]

FINDINGS: No consolidation, features of edema, pneumothorax, or effusion.
Pulmonary vascularity is normally distributed. The cardiomediastinal
contours are unremarkable. No acute osseous or soft tissue
abnormality.
IMPRESSION: No acute cardiopulmonary process.

## 2020-11-13 ENCOUNTER — Other Ambulatory Visit: Payer: Self-pay | Admitting: Medical

## 2020-11-15 ENCOUNTER — Other Ambulatory Visit: Payer: Self-pay

## 2020-11-15 ENCOUNTER — Ambulatory Visit (INDEPENDENT_AMBULATORY_CARE_PROVIDER_SITE_OTHER): Payer: BC Managed Care – PPO | Admitting: Medical

## 2020-11-15 ENCOUNTER — Encounter: Payer: Self-pay | Admitting: Medical

## 2020-11-15 VITALS — BP 110/70 | HR 93 | Ht 66.0 in | Wt 209.2 lb

## 2020-11-15 DIAGNOSIS — K8020 Calculus of gallbladder without cholecystitis without obstruction: Secondary | ICD-10-CM | POA: Diagnosis not present

## 2020-11-15 DIAGNOSIS — R270 Ataxia, unspecified: Secondary | ICD-10-CM | POA: Diagnosis not present

## 2020-11-15 DIAGNOSIS — M159 Polyosteoarthritis, unspecified: Secondary | ICD-10-CM

## 2020-11-15 DIAGNOSIS — F419 Anxiety disorder, unspecified: Secondary | ICD-10-CM

## 2020-11-15 DIAGNOSIS — R1011 Right upper quadrant pain: Secondary | ICD-10-CM

## 2020-11-15 DIAGNOSIS — E2839 Other primary ovarian failure: Secondary | ICD-10-CM

## 2020-11-15 DIAGNOSIS — G4700 Insomnia, unspecified: Secondary | ICD-10-CM

## 2020-11-15 DIAGNOSIS — N3281 Overactive bladder: Secondary | ICD-10-CM

## 2020-11-15 DIAGNOSIS — Z0000 Encounter for general adult medical examination without abnormal findings: Secondary | ICD-10-CM

## 2020-11-15 DIAGNOSIS — Z7185 Encounter for immunization safety counseling: Secondary | ICD-10-CM

## 2020-11-15 DIAGNOSIS — M150 Primary generalized (osteo)arthritis: Secondary | ICD-10-CM

## 2020-11-15 DIAGNOSIS — E785 Hyperlipidemia, unspecified: Secondary | ICD-10-CM | POA: Diagnosis not present

## 2020-11-15 DIAGNOSIS — N393 Stress incontinence (female) (male): Secondary | ICD-10-CM

## 2020-11-15 DIAGNOSIS — K5909 Other constipation: Secondary | ICD-10-CM

## 2020-11-15 DIAGNOSIS — K22719 Barrett's esophagus with dysplasia, unspecified: Secondary | ICD-10-CM

## 2020-11-15 DIAGNOSIS — F5104 Psychophysiologic insomnia: Secondary | ICD-10-CM

## 2020-11-15 DIAGNOSIS — F32A Depression, unspecified: Secondary | ICD-10-CM

## 2020-11-15 DIAGNOSIS — Z131 Encounter for screening for diabetes mellitus: Secondary | ICD-10-CM | POA: Diagnosis not present

## 2020-11-15 DIAGNOSIS — Z780 Asymptomatic menopausal state: Secondary | ICD-10-CM

## 2020-11-15 DIAGNOSIS — K219 Gastro-esophageal reflux disease without esophagitis: Secondary | ICD-10-CM

## 2020-11-15 DIAGNOSIS — Z8262 Family history of osteoporosis: Secondary | ICD-10-CM

## 2020-11-15 DIAGNOSIS — G479 Sleep disorder, unspecified: Secondary | ICD-10-CM

## 2020-11-15 HISTORY — PX: LIPOMA EXCISION: SHX5283

## 2020-11-15 NOTE — Progress Notes (Signed)
Subjective:  Kristi Avila is a 65 y.o. female who presents for Chief Complaint  Patient presents with   cpe    Cpe, had labs done this morning so she could eat. Already had flu shot, balance is off. Has gallstones so not sure when its time to get her gallbladder out. Since covid back in September having HA every day. Sees obgyn- [facility]     Medical team: Eye doctor [facility] Dr. Esmond Plants, Dr. Suella Broad, Dr. Hector Shade, [facility] Dr. Owens Loffler, GI   Concerns: Has concerns about gallbladder.  She continues to have right upper quadrant pain intermittent.  This is happening somewhat more frequent.  Would like to further evaluate gallbladder  She has concerns about being off balance at times.  She notes some tingling and numbness in the arms and legs from time to time.  No chest pain no palpitations no confusion, no actual fall.  She has ongoing problems with the left eye seeming blurry at times.  The symptoms have persisted since having COVID back in September.  She had her flu shot recently at [place] [facility] last year was at [facility]  She is having a lipoma removed from her left shoulder by orthopedics, Dr. Veverly Fells within the next few weeks  No other aggravating or relieving factors.    No other c/o.  Past Medical History:  Diagnosis Date   Arthritis    Barrett's esophagus with esophagitis 07/23/12   EGD: CLO test results negative. pathology: Barretss: Repeat EGD 3 years   Blood transfusion without reported diagnosis    remote past   Depression    Family history of anesthesia complication    mother trouble waking up   Frequency of urination    GERD (gastroesophageal reflux disease)    h/o peptic ulcer   High cholesterol    HSV-2 infection    anal   Nocturia    Obesity    Overactive bladder    Reflux    Urgency of urination    Current Outpatient Medications on File Prior to Visit  Medication Sig Dispense Refill    acetaminophen (TYLENOL) 500 MG tablet Take 1,000 mg by mouth every 6 (six) hours as needed.     cholecalciferol (VITAMIN D3) 25 MCG (1000 UNIT) tablet Take 1,000 Units by mouth daily.     Cyanocobalamin (VITAMIN B 12 PO) Take 1 tablet by mouth daily.     DEXILANT 60 MG capsule TAKE 1 CAPSULE BY MOUTH EVERY DAY 60 capsule 1   DULoxetine (CYMBALTA) 60 MG capsule TAKE ONE CAPSULE BY MOUTH TWICE DAILY 180 capsule 0   ezetimibe (ZETIA) 10 MG tablet TAKE 1 TABLET(10 MG) BY MOUTH EVERY MORNING 90 tablet 0   famotidine (PEPCID) 20 MG tablet Take 1 tablet (20 mg total) by mouth at bedtime. 30 tablet 5   Magnesium 400 MG TABS Take 1 capsule by mouth 2 (two) times daily.     meloxicam (MOBIC) 15 MG tablet TAKE 1 TABLET(15 MG) BY MOUTH DAILY 30 tablet 1   MULTIPLE VITAMIN PO Take 1 tablet by mouth daily.     psyllium (METAMUCIL) 58.6 % powder Take by mouth. 2 teaspoons nightly     tolterodine (DETROL LA) 2 MG 24 hr capsule TAKE 1 CAPSULE(2 MG) BY MOUTH DAILY 30 capsule 0   TRULANCE 3 MG TABS TAKE 1 TABLET BY MOUTH DAILY 30 tablet 2   valACYclovir (VALTREX) 500 MG tablet TAKE 1 TABLET(500 MG) BY  MOUTH DAILY 90 tablet 0   ramelteon (ROZEREM) 8 MG tablet TAKE 1 TABLET(8 MG) BY MOUTH AT BEDTIME 30 tablet 0   No current facility-administered medications on file prior to visit.    The following portions of the patient's history were reviewed and updated as appropriate: allergies, current medications, past family history, past medical history, past social history, past surgical history and problem list.  ROS Otherwise as in subjective above     Objective: BP 110/70   Pulse 93   Ht 5\' 6"  (1.676 m)   Wt 209 lb 3.2 oz (94.9 kg)   LMP 12/31/2019   BMI 33.77 kg/m   Wt Readings from Last 3 Encounters:  11/15/20 209 lb 3.2 oz (94.9 kg)  09/16/20 204 lb (92.5 kg)  09/08/20 205 lb (93 kg)   BP Readings from Last 3 Encounters:  11/15/20 110/70  09/16/20 105/81  08/11/20 96/66      General  appearence: alert, no distress, WD/WN, white female HEENT: normocephalic, sclerae anicteric, PERRLA, EOMi Neck: supple, no lymphadenopathy, no thyromegaly, no masses, no bruits Heart: RRR, normal S1, S2, no murmurs Lungs: CTA bilaterally, no wheezes, rhonchi, or rales Abdomen: +bs, soft, mild right upper quadrant and epigastric tenderness, non tender, non distended, no masses, no hepatomegaly, no splenomegaly Back: non tender Musculoskeletal: nontender, no swelling, no obvious deformity Extremities: no edema, no cyanosis, no clubbing Pulses: 2+ symmetric, upper and lower extremities, normal cap refill Neurological: Off balance with heel-to-toe, otherwise alert, oriented x 3, CN2-12 intact, strength normal upper extremities and lower extremities, sensation normal throughout, DTRs 2+ throughout, no cerebellar signs, gait normal Psychiatric: normal affect, behavior normal, pleasant  Gyn/ Breast exam deferred to gynecology   Assessment: Encounter Diagnoses  Name Primary?   Encounter for health maintenance examination in adult Yes   RUQ pain    Calculus of gallbladder without cholecystitis without obstruction    Ataxia    Vaccine counseling    Stress incontinence    Sleep disturbance    Screening for diabetes mellitus    Post-menopausal    Overactive bladder    Primary osteoarthritis involving multiple joints    Estrogen deficiency    Family history of osteoporosis    Gastroesophageal reflux disease, unspecified whether esophagitis present    Hyperlipidemia, unspecified hyperlipidemia type    Insomnia, unspecified type    Anxiety and depression    Barrett's esophagus with dysplasia    Chronic insomnia    Chronic constipation      Plan: This visit was a preventative care visit, also known as wellness visit or routine physical.   Topics typically include healthy lifestyle, diet, exercise, preventative care, vaccinations, sick and well care, proper use of emergency dept and after  hours care, as well as other concerns.     Recommendations: Continue to return yearly for your annual wellness and preventative care visits.  This gives [place] a chance to discuss healthy lifestyle, exercise, vaccinations, review your chart record, and perform screenings where appropriate.  I recommend you see your eye doctor yearly for routine vision care.  I recommend you see your dentist yearly for routine dental care including hygiene visits twice yearly.  See your gynecologist yearly for routine gynecological care.   Vaccination recommendations were reviewed Immunization History  Administered Date(s) Administered   Influenza Inj Mdck Quad Pf 09/18/2017   Influenza Whole 10/28/2012   Influenza,inj,Quad PF,6+ Mos 10/12/2015, 10/10/2016, 09/08/2018   Influenza,inj,quad, With Preservative 10/15/2016   Influenza-Unspecified 11/29/2013, 10/26/2014, 10/15/2019, 10/13/2020  PFIZER Comirnaty(Gray Top)Covid-19 Tri-Sucrose Vaccine 05/04/2020   PFIZER(Purple Top)SARS-COV-2 Vaccination 02/23/2019, 03/20/2019, 10/15/2019   Tdap 06/04/2011, 10/23/2019   Zoster Recombinat (Shingrix) 11/28/2018, 01/28/2019   Zoster, Live 11/29/2013    We will get copy of recent flu vaccine documentation    Screening for cancer: Colon cancer screening: I reviewed your colonoscopy on file that is up to date from 2017  Breast cancer screening: You should perform a self breast exam monthly.   We reviewed recommendations for regular mammograms and breast cancer screening. We will request last mammogram from [facility]  Cervical cancer screening: We reviewed recommendations for pap smear screening.   Skin cancer screening: Check your skin regularly for new changes, growing lesions, or other lesions of concern Come in for evaluation if you have skin lesions of concern.  Lung cancer screening: If you have a greater than 20 pack year history of tobacco use, then you may qualify for lung cancer screening with a  chest CT scan.   Please call your insurance company to inquire about coverage for this test.  We currently don't have screenings for other cancers besides breast, cervical, colon, and lung cancers.  If you have a strong family history of cancer or have other cancer screening concerns, please let me know.    Bone health: Get at least 150 minutes of aerobic exercise weekly Get weight bearing exercise at least once weekly Bone density test:  A bone density test is an imaging test that uses a type of X-ray to measure the amount of calcium and other minerals in your bones. The test may be used to diagnose or screen you for a condition that causes weak or thin bones (osteoporosis), predict your risk for a broken bone (fracture), or determine how well your osteoporosis treatment is working. The bone density test is recommended for females 76 and older, or females or males <04 if certain risk factors such as thyroid disease, long term use of steroids such as for asthma or rheumatological issues, vitamin D deficiency, estrogen deficiency, family history of osteoporosis, self or family history of fragility fracture in first degree relative.  Bone density test from 2020 reviewed which was normal   Heart health: Get at least 150 minutes of aerobic exercise weekly Limit alcohol It is important to maintain a healthy blood pressure and healthy cholesterol numbers  Heart disease screening: Screening for heart disease includes screening for blood pressure, fasting lipids, glucose/diabetes screening, BMI height to weight ratio, reviewed of smoking status, physical activity, and diet.    Goals include blood pressure 120/80 or less, maintaining a healthy lipid/cholesterol profile, preventing diabetes or keeping diabetes numbers under good control, not smoking or using tobacco products, exercising most days per week or at least 150 minutes per week of exercise, and eating healthy variety of fruits and vegetables,  healthy oils, and avoiding unhealthy food choices like fried food, fast food, high sugar and high cholesterol foods.    Other tests may possibly include EKG test, CT coronary calcium score, echocardiogram, exercise treadmill stress test.     Medical care options: I recommend you continue to seek care here first for routine care.  We try really hard to have available appointments Monday through Friday daytime hours for sick visits, acute visits, and physicals.  Urgent care should be used for after hours and weekends for significant issues that cannot wait till the next day.  The emergency department should be used for significant potentially life-threatening emergencies.  The emergency department is expensive,  can often have long wait times for less significant concerns, so try to utilize primary care, urgent care, or telemedicine when possible to avoid unnecessary trips to the emergency department.  Virtual visits and telemedicine have been introduced since the pandemic started in 2020, and can be convenient ways to receive medical care.  We offer virtual appointments as well to assist you in a variety of options to seek medical care.   Other significant issues:  RUQ pain - schedule HIDA scan, c/t GERD medication, avoid fatty and acicid foods  Ataxia, paresthesias-labs today, consider neurology consult  Lipoma-follow-up with surgeon this week as planned  Barrett's esophagus-continue to follow-up with gastroenterology  History of HSV-Valtrex as needed  Hyperlipidemia-continue Zetia  Sleep disturbance, insomnia-uses Rozerem  Chronic constipation-doing fine on current therapy   Kyarah was seen today for cpe.  Diagnoses and all orders for this visit:  Encounter for health maintenance examination in adult -     Lipid panel -     Hemoglobin A1c -     TSH -     Comprehensive metabolic panel -     Vitamin B12  RUQ pain -     NM Hepato W/Eject Fract; Future  Calculus of gallbladder  without cholecystitis without obstruction -     NM Hepato W/Eject Fract; Future  Ataxia -     TSH -     Vitamin B12  Vaccine counseling  Stress incontinence  Sleep disturbance  Screening for diabetes mellitus -     Hemoglobin A1c  Post-menopausal  Overactive bladder  Primary osteoarthritis involving multiple joints  Estrogen deficiency  Family history of osteoporosis  Gastroesophageal reflux disease, unspecified whether esophagitis present  Hyperlipidemia, unspecified hyperlipidemia type -     Lipid panel  Insomnia, unspecified type  Anxiety and depression  Barrett's esophagus with dysplasia  Chronic insomnia  Chronic constipation   Follow up: pending labs

## 2020-11-16 ENCOUNTER — Other Ambulatory Visit: Payer: Self-pay | Admitting: Medical

## 2020-11-16 DIAGNOSIS — R270 Ataxia, unspecified: Secondary | ICD-10-CM

## 2020-11-16 DIAGNOSIS — R202 Paresthesia of skin: Secondary | ICD-10-CM

## 2020-11-16 DIAGNOSIS — G479 Sleep disorder, unspecified: Secondary | ICD-10-CM

## 2020-11-16 LAB — TSH: TSH: 1.93 u[IU]/mL (ref 0.450–4.500)

## 2020-11-16 LAB — VITAMIN B12: Vitamin B-12: >2000 pg/mL — ABNORMAL HIGH (ref 232–1245)

## 2020-11-16 LAB — COMPREHENSIVE METABOLIC PANEL
ALT: 27 IU/L (ref 0–32)
AST: 26 IU/L (ref 0–40)
Albumin/Globulin Ratio: 2.2 (ref 1.2–2.2)
Albumin: 4.7 g/dL (ref 3.8–4.8)
Alkaline Phosphatase: 107 IU/L (ref 44–121)
BUN/Creatinine Ratio: 13 (ref 12–28)
BUN: 13 mg/dL (ref 8–27)
Bilirubin Total: 0.3 mg/dL (ref 0.0–1.2)
CO2: 24 mmol/L (ref 20–29)
Calcium: 10 mg/dL (ref 8.7–10.3)
Chloride: 102 mmol/L (ref 96–106)
Creatinine, Ser: 0.98 mg/dL (ref 0.57–1.00)
Globulin, Total: 2.1 g/dL (ref 1.5–4.5)
Glucose: 93 mg/dL (ref 70–99)
Potassium: 4.7 mmol/L (ref 3.5–5.2)
Sodium: 141 mmol/L (ref 134–144)
Total Protein: 6.8 g/dL (ref 6.0–8.5)
eGFR: 65 mL/min/{1.73_m2} (ref >59)

## 2020-11-16 LAB — LIPID PANEL
Chol/HDL Ratio: 3.3 ratio (ref 0.0–4.4)
Cholesterol, Total: 243 mg/dL — ABNORMAL HIGH (ref 100–199)
HDL: 73 mg/dL (ref >39)
LDL Chol Calc (NIH): 147 mg/dL — ABNORMAL HIGH (ref 0–99)
Triglycerides: 131 mg/dL (ref 0–149)
VLDL Cholesterol Cal: 23 mg/dL (ref 5–40)

## 2020-11-16 LAB — HEMOGLOBIN A1C
Est. average glucose Bld gHb Est-mCnc: 114 mg/dL
Hgb A1c MFr Bld: 5.6 % (ref 4.8–5.6)

## 2020-11-16 MED ORDER — TRULANCE 3 MG PO TABS: 1.0000 | ORAL_TABLET | Freq: Every day | ORAL | 3 refills | Status: DC

## 2020-11-16 MED ORDER — TOLTERODINE TARTRATE ER 2 MG PO CP24: ORAL_CAPSULE | ORAL | 3 refills | Status: DC

## 2020-11-16 MED ORDER — ROSUVASTATIN CALCIUM 5 MG PO TABS
5.0000 mg | ORAL_TABLET | ORAL | 2 refills | Status: DC
Start: 2020-11-16 — End: 2021-05-22

## 2020-11-16 MED ORDER — EZETIMIBE 10 MG PO TABS: 10.0000 mg | ORAL_TABLET | Freq: Every day | ORAL | 3 refills | Status: DC

## 2020-11-16 MED ORDER — RAMELTEON 8 MG PO TABS: ORAL_TABLET | ORAL | 1 refills | Status: DC

## 2020-11-18 ENCOUNTER — Encounter: Payer: Self-pay | Admitting: Medical

## 2020-11-18 ENCOUNTER — Encounter: Payer: Self-pay | Admitting: Internal Medicine

## 2020-11-21 ENCOUNTER — Other Ambulatory Visit: Payer: Self-pay | Admitting: Orthopedic Surgery

## 2020-11-21 ENCOUNTER — Telehealth: Payer: Self-pay | Admitting: Medical

## 2020-11-21 DIAGNOSIS — D1722 Benign lipomatous neoplasm of skin and subcutaneous tissue of left arm: Secondary | ICD-10-CM | POA: Diagnosis not present

## 2020-11-21 NOTE — Telephone Encounter (Signed)
Pt called and states that she was to have a radiology test for her gall bladder and she hasn't heard anything. I see orders but she would like additional info on that.   ALSO, pt states that during her visit Audelia Acton stated he was going to refer her to Neuro and I do not see anything in for that.   Please advise pt at [phone].

## 2020-11-21 NOTE — Telephone Encounter (Signed)
Can you check the status of these  please. Neuro referral was placed

## 2020-11-22 ENCOUNTER — Telehealth: Payer: Self-pay | Admitting: Medical

## 2020-11-22 NOTE — Telephone Encounter (Signed)
Radiology Appointment

## 2020-11-29 ENCOUNTER — Ambulatory Visit (HOSPITAL_COMMUNITY)
Admission: RE | Admit: 2020-11-29 | Discharge: 2020-11-29 | Disposition: A | Discharge status: Home / Self Care | Payer: BC Managed Care – PPO | Source: Ambulatory Visit | Attending: Medical | Admitting: Medical

## 2020-11-29 ENCOUNTER — Other Ambulatory Visit: Payer: Self-pay

## 2020-11-29 DIAGNOSIS — K8020 Calculus of gallbladder without cholecystitis without obstruction: Secondary | ICD-10-CM | POA: Diagnosis not present

## 2020-11-29 DIAGNOSIS — R1011 Right upper quadrant pain: Secondary | ICD-10-CM | POA: Diagnosis not present

## 2020-11-29 MED ORDER — TECHNETIUM TC 99M MEBROFENIN IV KIT
5.1000 | PACK | Freq: Once | INTRAVENOUS | Status: AC | PRN
  Administered 2020-11-29: 5.1 via INTRAVENOUS

## 2020-11-30 ENCOUNTER — Encounter: Payer: Self-pay | Admitting: Neurology

## 2020-11-30 ENCOUNTER — Ambulatory Visit (INDEPENDENT_AMBULATORY_CARE_PROVIDER_SITE_OTHER): Payer: BC Managed Care – PPO | Admitting: Neurology

## 2020-11-30 VITALS — BP 117/71 | HR 78 | Ht 66.0 in | Wt 214.4 lb

## 2020-11-30 DIAGNOSIS — R519 Headache, unspecified: Secondary | ICD-10-CM

## 2020-11-30 DIAGNOSIS — R202 Paresthesia of skin: Secondary | ICD-10-CM | POA: Diagnosis not present

## 2020-11-30 DIAGNOSIS — R6889 Other general symptoms and signs: Secondary | ICD-10-CM | POA: Diagnosis not present

## 2020-11-30 DIAGNOSIS — R2689 Other abnormalities of gait and mobility: Secondary | ICD-10-CM

## 2020-11-30 NOTE — Progress Notes (Signed)
Subjective:    Patient ID: Kristi Avila is a 65 y.o. female.  HPI    Star Age, MD, PhD [facility] [address].O. [place], [place] [address]  Dear [name],   I saw your patient, Kristi Avila, upon your kind request in my sleep clinic today for initial consultation of her balance problem.  The patient is accompanied by her boyfriend, Clare Gandy, today.  This was originally scheduled as a sleep consultation but the patient reports that she has no sleep-related issues and would like to discuss her balance issues and cancel evaluation for sleep disordered as she does not have any concerns in that regard. As you know, [name] is a 65 year old right-handed woman with an underlying medical history of arthritis, bilateral hip replacements under Dr. Maureen Ralphs, chronic neck and low back pain, status post injections into the lower back and also upper back with steroids under Dr. Nelva Bush, reflux disease, Barrett's esophagus, depression, hyperlipidemia, overactive bladder, and obesity, who reports an approximately 1 year issue with feeling off balance at times.  Symptoms come and go, she denies any vertigo symptoms or lightheadedness or feeling of fainting, she reports this feeling more secure, stumbling even having had no recent falls thankfully.  She has had multiple joint surgeries including bilateral knee replacement surgeries and bilateral hip replacement surgeries.  She endorses chronic neck pain and low back pain.  She has had intermittent headaches for which she takes Tylenol.  She does not have a history of migraines.  She has intermittently noticed tingling in her upper extremities and lower extremities, no sustained numbness.  She has not had any sudden onset of one-sided weakness or numbness or tingling or droopy face or slurring of speech.  She happens well with water, she limits her caffeine to soda, approximately 1 or two 8 ounce servings per day on average.   She drinks alcohol on the weekends, typically Friday nights and Saturday nights, up to 3 drinks each day.  She is a non-smoker.  She has had some forgetfulness.  She reports a family history of dementia affecting both paternal grandparents.   I reviewed your office note from 06/15/2020.  She had recent blood work through your office and I was able to review the results: Lipid panel showed elevated total cholesterol at 243, triglycerides 131, HDL 73, LDL 147, A1c was 5.6, TSH 1.93, CMP showed glucose 90, BUN 13, creatinine 0.98, alk phos 107, AST 26, ALT 27.  Vitamin B12 was elevated above 2000.  She had a CT of the abdomen and pelvis with contrast on 12/31/2019 which showed:  IMPRESSION: 1. Cholelithiasis with no CT findings to suggest acute cholecystitis or choledocholithiasis. 2. Uterine fibroid.  Of note, she is on Xanax as needed, 0.25 mg strength half a pill to 1 pill as needed for anxiety, reports that she takes it maybe 3 times in a month.  She does not currently take any risperidone.  She is on Cymbalta 60 mg strength 1 pill twice daily.  She was on amitriptyline for her bladder hyperactivity but due to mouth dryness she was switched to Detrol.  She had a brain MRI with and without contrast due to left-sided hearing loss on 05/08/2017 and I reviewed the results: IMPRESSION: Negative exam.  She had a brain MRI with and without contrast on 11/30/2004 for indication of dizziness and vertigo and I reviewed the results: Impression: Negative MRI of the brain.  Scattered ethmoid air cell disease with  probable previous nasal surgery.   Her Past Medical History Is Significant For: Past Medical History:  Diagnosis Date   Arthritis    Barrett's esophagus with esophagitis 07/23/12   EGD: CLO test results negative. pathology: Barretss: Repeat EGD 3 years   Blood transfusion without reported diagnosis    remote past   Depression    Family history of anesthesia complication    mother trouble waking up    Frequency of urination    GERD (gastroesophageal reflux disease)    h/o peptic ulcer   High cholesterol    HSV-2 infection    anal   Nocturia    Obesity    Overactive bladder    Reflux    Urgency of urination     Her Past Surgical History Is Significant For: Past Surgical History:  Procedure Laterality Date   ARTHROSCOPY KNEE W/ DRILLING     X4 ON RT KNEE   CARPAL TUNNEL RELEASE Right 2000   colonscopy  2007, 2017   ESOPHAGOGASTRODUODENOSCOPY  2007   Dr. Ferdinand Lango, [place]   foot fibroma surgery  all before 1985   x 3   HEMORRHOID SURGERY  04/2006   HIP FRACTURE SURGERY Right    KNEE ARTHROSCOPY     X2 LEFT KNEE   SINUS EXPLORATION  1995   TOE AMPUTATION  1959   TOTAL KNEE ARTHROPLASTY Right 09/22/2012   Procedure: RIGHT TOTAL KNEE ARTHROPLASTY;  Surgeon: [name], MD;  Location: [facility];  Service: Orthopedics;  Laterality: Right;   TOTAL KNEE ARTHROPLASTY Left 09/28/2013   Procedure: LEFT TOTAL KNEE ARTHROPLASTY, CORTISONE INJECTION RIGHT HIP;  Surgeon: [name], MD;  Location: [facility];  Service: Orthopedics;  Laterality: Left;    Her Family History Is Significant For: Family History  Problem Relation Age of Onset   Arthritis Mother    Pulmonary fibrosis Father        pulmonary fibrosis   Pulmonary disease Father    Cancer Sister        pancreatic   Diabetes Brother    Colon cancer Neg Hx    Esophageal cancer Neg Hx    Stomach cancer Neg Hx     Her Social History Is Significant For: Social History   Socioeconomic History   Marital status: Divorced    Spouse name: Not on file   Number of children: 0   Years of education: Not on file   Highest education level: Not on file  Occupational History   Occupation: Diplomatic Services operational officer [employer]  Tobacco Use   Smoking status: Never   Smokeless tobacco: Never  Vaping Use   Vaping Use: Never used  Substance and Sexual Activity   Alcohol use: Yes    Alcohol/week: 6.0 standard drinks     Types: 6 Cans of beer per week    Comment: 6 drinks a week   Drug use: No   Sexual activity: Not Currently  Other Topics Concern   Not on file  Social History Narrative   Lives alone, 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  04/2020   Social Determinants of Health   Financial Resource Strain: Not on file  Food Insecurity: Not on file  Transportation Needs: Not on file  Physical Activity: Not on file  Stress: Not on file  Social Connections: Not on file    Her Allergies Are:  Allergies  Allergen Reactions   Cephalosporins Diarrhea   Codeine Itching  Latex Itching   Pravastatin     Myalgias   Simvastatin     myalgias   Sulfa Antibiotics Diarrhea  :   Her Current Medications Are:  Outpatient Encounter Medications as of 11/30/2020  Medication Sig   cholecalciferol (VITAMIN D3) 25 MCG (1000 UNIT) tablet Take 1,000 Units by mouth daily.   Cyanocobalamin (VITAMIN B 12 PO) Take 1 tablet by mouth daily.   DEXILANT 60 MG capsule TAKE 1 CAPSULE BY MOUTH EVERY DAY   DULoxetine (CYMBALTA) 60 MG capsule TAKE ONE CAPSULE BY MOUTH TWICE DAILY   ezetimibe (ZETIA) 10 MG tablet Take 1 tablet (10 mg total) by mouth daily.   famotidine (PEPCID) 20 MG tablet Take 1 tablet (20 mg total) by mouth at bedtime.   Magnesium 400 MG TABS Take 1 capsule by mouth 2 (two) times daily.   meloxicam (MOBIC) 15 MG tablet TAKE 1 TABLET(15 MG) BY MOUTH DAILY   MULTIPLE VITAMIN PO Take 1 tablet by mouth daily.   Plecanatide (TRULANCE) 3 MG TABS Take 1 tablet by mouth daily.   psyllium (METAMUCIL) 58.6 % powder Take by mouth. 2 teaspoons nightly   rosuvastatin (CRESTOR) 5 MG tablet Take 1 tablet (5 mg total) by mouth every other day.   tolterodine (DETROL LA) 2 MG 24 hr capsule TAKE 1 CAPSULE(2 MG) BY MOUTH DAILY   valACYclovir (VALTREX) 500 MG tablet TAKE 1 TABLET(500 MG) BY MOUTH DAILY   ramelteon (ROZEREM) 8 MG tablet TAKE 1 TABLET(8 MG) BY MOUTH AT BEDTIME (Patient not taking:  Reported on 11/30/2020)   [DISCONTINUED] acetaminophen (TYLENOL) 500 MG tablet Take 1,000 mg by mouth every 6 (six) hours as needed. (Patient not taking: Reported on 11/30/2020)   No facility-administered encounter medications on file as of 11/30/2020.  :   Review of Systems:  Out of a complete 14 point review of systems, all are reviewed and negative with the exception of these symptoms as listed below:  Review of Systems  Neurological:        Pt here and wants to discuss balance vs sleep.  No falls, stumbles, has had bil knee and hips total replacements (last one L) 05/2020.  ESS: 2, FSS 18. Notes some palpitations when stressed. (She thought she would mention)   Objective:  Neurological Exam  Physical Exam Physical Examination:   Vitals:   11/30/20 1337  BP: 117/71  Pulse: 78    General Examination: The patient is a very pleasant 65 y.o. female in no acute distress. She appears well-developed and well-nourished and well groomed.   HEENT: Normocephalic, atraumatic, pupils are equal, round and reactive to light, extraocular tracking is good without limitation to gaze excursion or nystagmus noted.  Corrective eyeglasses in place.  Hearing is grossly intact, left-sided hearing aid in place. Face is symmetric with normal facial animation and normal facial sensation to light touch, temperature and vibration sense. Speech is clear with no dysarthria noted. There is no hypophonia. There is no lip, neck/head, jaw or voice tremor. Neck is supple with full range of passive and active motion. There are no carotid bruits on auscultation. Oropharynx exam reveals: moderate mouth dryness, tongue protrudes centrally and palate elevates symmetrically.   Chest: Clear to auscultation without wheezing, rhonchi or crackles noted.  Heart: S1+S2+0, regular and normal without murmurs, rubs or gallops noted.   Abdomen: Soft, non-tender and non-distended.  Extremities: There is no pitting edema in the  distal lower extremities bilaterally.   Skin: Warm and dry without trophic changes  noted.   Musculoskeletal: exam reveals no obvious joint deformities, unremarkable knee replacement scars both knees.   Neurologically:  Mental status: The patient is awake, alert and oriented in all 4 spheres. Her immediate and remote memory, attention, language skills and fund of knowledge are appropriate. There is no evidence of aphasia, agnosia, apraxia or anomia. Speech is clear with normal prosody and enunciation. Thought process is linear. Mood is normal and affect is normal.  Cranial nerves II - XII are as described above under HEENT exam.  Motor exam: Normal bulk, strength and tone is noted. There is no tremor, drift or rebound.  Romberg is negative. Reflexes are 1+ in the upper extremities, absent in both knees and 1+ in both ankles, toes are downgoing bilaterally.  Cerebellar testing: No dysmetria or intention tremor. There is no truncal or gait ataxia.  Sensory exam: intact to light touch temperature, and vibration sense in the upper and lower extremities.  Gait, station and balance: She stands easily. No veering to one side is noted. No leaning to one side is noted. Posture is age-appropriate and stance is narrow based. Gait shows normal stride length and normal pace.  Preserved arm swing noted, no problems turning are noted. Tandem walk is challenging but doable.                  Assessment and Plan:   In summary, SHANNON BALTHAZAR is a very pleasant 65 y.o.-year old female with an underlying medical history of arthritis, bilateral hip replacements under Dr. Despina Hick, chronic neck and low back pain, status post injections into the lower back and also upper back with steroids under Dr. Ethelene Hal, reflux disease, Barrett's esophagus, depression, hyperlipidemia, overactive bladder, and obesity, who presents for evaluation of her balance disturbance for the past year.  She has a nonspecific complaint about her balance,  exam is actually benign.  She has no deficit on sensory testing and reflexes are symmetrical.  Fine motor skills well-preserved, no evidence of parkinsonism.  She has had intermittent paresthesias, she does have chronic arthritis including chronic neck and back pain.  Some of her symptoms may tie in with degenerative arthritis of the spine.  Her balance issues can be in part related to normal aging but also medication effect from taking Cymbalta and Detrol.  She is advised that her symptoms are nonspecific, exam findings benign and she is largely reassured today.  Nevertheless, to rule out a structural cause of her symptoms and in light of all of her complaint of forgetfulness, I would like to proceed with a brain MRI with and without contrast.  She has had intermittent milder headaches, no evidence of migraines, no vertigo, no evidence of orthostatic symptoms.  She denies any sleep-related issues at this time. Months of healthy lifestyle, good hydration, getting enough rest.  She is agreeable to pursuing a brain MRI.  Previous brain MRI scans from 2006 in 2019 were benign.  For now, we will keep her posted as to her results by phone call and plan to follow-up in this clinic on an as-needed basis so long as her MRI results are benign and age-appropriate.   I answered all her questions today to the patient and Clide Cliff were in agreement.  Thank you very much for allowing me to participate in the care of this nice patient. If I can be of any further assistance to you please do not hesitate to call me at [phone].  Sincerely,   Huston Foley, MD, PhD

## 2020-11-30 NOTE — Patient Instructions (Signed)
It was nice to meet you both today.  Your balance issues may be from more than 1 underlying issue, including degenerative arthritis affecting your back and other joint areas, medication effect from particularly the Cymbalta and Detrol.  Please try to continue to hydrate well with water and stay well rested.  Limit your alcohol intake and caffeine intake.    Please remember to stand up slowly and get your bearings first turn slowly, no bending down to pick anything, no heavy lifting, be extra careful at night and first thing in the morning. Also, be careful in the Bathroom and the kitchen.   As far as diagnostic testing: I suggest we proceed with a brain scan, called MRI. We will call you with the test results. We will have to schedule you for this on a separate date. This test requires authorization from your insurance, and we will take care of the insurance process.   So long as your brain MRI shows reassuring findings, we will keep you posted as to the results by phone call with and can see back in this clinic on an as-needed basis.

## 2020-12-04 ENCOUNTER — Other Ambulatory Visit: Payer: Self-pay | Admitting: Family Medicine

## 2020-12-05 NOTE — Telephone Encounter (Signed)
[facility] is requesting to fill pt mobic. Please advise KH 

## 2020-12-09 ENCOUNTER — Other Ambulatory Visit: Payer: Self-pay | Admitting: Medical

## 2020-12-12 ENCOUNTER — Telehealth: Payer: Self-pay | Admitting: Neurology

## 2020-12-12 MED ORDER — ALPRAZOLAM 0.5 MG PO TABS: ORAL_TABLET | ORAL | 0 refills | Status: DC

## 2020-12-12 NOTE — Telephone Encounter (Signed)
Spoke with patient and discussed the Xanax instructions. Pt aware she will need a driver. She verbalized appreciation for the call.

## 2020-12-12 NOTE — Telephone Encounter (Signed)
Patient is scheduled for MRI on 12/14/20. She states she is claustrophobic, but thinks she will be fine in our MRI machine. Patient states she would like something to be ordered for her to take for anxiety in case she needs it for the exam.

## 2020-12-12 NOTE — Telephone Encounter (Signed)
I have ordered Xanax for patient's upcoming MRI due to anxiety/claustrophobia reported. Please inform patient or caregiver and remind them, that she should not drive after taking Xanax and have someone take her for the MRI appointment.

## 2020-12-14 ENCOUNTER — Ambulatory Visit (INDEPENDENT_AMBULATORY_CARE_PROVIDER_SITE_OTHER): Payer: BC Managed Care – PPO

## 2020-12-14 ENCOUNTER — Other Ambulatory Visit: Payer: Self-pay

## 2020-12-14 DIAGNOSIS — R2689 Other abnormalities of gait and mobility: Secondary | ICD-10-CM | POA: Diagnosis not present

## 2020-12-14 DIAGNOSIS — R519 Headache, unspecified: Secondary | ICD-10-CM | POA: Diagnosis not present

## 2020-12-14 DIAGNOSIS — R202 Paresthesia of skin: Secondary | ICD-10-CM

## 2020-12-14 DIAGNOSIS — N644 Mastodynia: Secondary | ICD-10-CM | POA: Diagnosis not present

## 2020-12-14 DIAGNOSIS — R6889 Other general symptoms and signs: Secondary | ICD-10-CM

## 2020-12-14 LAB — HM MAMMOGRAPHY

## 2020-12-14 MED ORDER — GADOBENATE DIMEGLUMINE 529 MG/ML IV SOLN
15.0000 mL | Freq: Once | INTRAVENOUS | Status: AC | PRN
  Administered 2020-12-14: 15 mL via INTRAVENOUS

## 2020-12-21 DIAGNOSIS — Z780 Asymptomatic menopausal state: Secondary | ICD-10-CM | POA: Diagnosis not present

## 2020-12-21 DIAGNOSIS — M85832 Other specified disorders of bone density and structure, left forearm: Secondary | ICD-10-CM | POA: Diagnosis not present

## 2020-12-21 DIAGNOSIS — M85831 Other specified disorders of bone density and structure, right forearm: Secondary | ICD-10-CM | POA: Diagnosis not present

## 2020-12-21 LAB — HM DEXA SCAN: HM Dexa Scan: NORMAL

## 2020-12-22 ENCOUNTER — Encounter: Payer: Self-pay | Admitting: Internal Medicine

## 2020-12-23 ENCOUNTER — Telehealth: Payer: Self-pay | Admitting: Medical

## 2020-12-23 NOTE — Telephone Encounter (Signed)
Bone density test shows osteopenia or low bone mass, but not osteoporosis.  I do recommend they exercise regularly including aerobic and weight bearing exercise.   Weight-bearing physical activity and exercises that improve balance and posture can strengthen bones and reduce the chance of a fracture. The more active and fit you are as you age, the less likely you are to fall and break a bone.   Good nutrition. Eat a healthy diet and make certain that you're getting 1200mg  of calcium daily in the diet from dairy (typically 4 servings) or supplements OTC.   Also they should be getting Vitamin D through eating fish, seafood, getting sun exposure, and using either OTC Vitamin D supplement such as 600 IU daily   Lets plan to repeat the bone density study in 2 years.

## 2020-12-23 NOTE — Telephone Encounter (Signed)
Spoke with patient and discussed results. Pt advise to send through her mychart as well so she can have it in writing

## 2020-12-27 ENCOUNTER — Institutional Professional Consult (permissible substitution): Payer: BC Managed Care – PPO | Admitting: Neurology

## 2020-12-30 ENCOUNTER — Encounter: Payer: Self-pay | Admitting: Internal Medicine

## 2021-01-02 DIAGNOSIS — L821 Other seborrheic keratosis: Secondary | ICD-10-CM | POA: Diagnosis not present

## 2021-01-02 DIAGNOSIS — L281 Prurigo nodularis: Secondary | ICD-10-CM | POA: Diagnosis not present

## 2021-01-02 DIAGNOSIS — L820 Inflamed seborrheic keratosis: Secondary | ICD-10-CM | POA: Diagnosis not present

## 2021-01-02 DIAGNOSIS — Z85828 Personal history of other malignant neoplasm of skin: Secondary | ICD-10-CM | POA: Diagnosis not present

## 2021-01-02 DIAGNOSIS — L570 Actinic keratosis: Secondary | ICD-10-CM | POA: Diagnosis not present

## 2021-01-06 ENCOUNTER — Telehealth: Payer: Self-pay

## 2021-01-06 NOTE — Telephone Encounter (Signed)
P.A. DEXILANT completed  

## 2021-01-06 NOTE — Telephone Encounter (Signed)
P.A. approved til 01/05/22, sent pt mychart message

## 2021-01-10 NOTE — Telephone Encounter (Signed)
DONE

## 2021-01-10 NOTE — Telephone Encounter (Signed)
P.A. approved, sent pt mychart message

## 2021-01-24 ENCOUNTER — Other Ambulatory Visit: Payer: Self-pay | Admitting: Medical

## 2021-01-31 ENCOUNTER — Other Ambulatory Visit: Payer: Self-pay | Admitting: Family Medicine

## 2021-01-31 DIAGNOSIS — U071 COVID-19: Secondary | ICD-10-CM

## 2021-02-06 ENCOUNTER — Other Ambulatory Visit: Payer: Self-pay | Admitting: Medical

## 2021-02-07 ENCOUNTER — Other Ambulatory Visit: Payer: Self-pay | Admitting: Medical

## 2021-02-09 ENCOUNTER — Encounter: Payer: Self-pay | Admitting: Internal Medicine

## 2021-02-09 ENCOUNTER — Telehealth: Payer: Self-pay

## 2021-02-09 ENCOUNTER — Telehealth: Payer: Self-pay | Admitting: Medical

## 2021-02-09 ENCOUNTER — Other Ambulatory Visit: Payer: Self-pay | Admitting: Medical

## 2021-02-09 MED ORDER — VALACYCLOVIR HCL 500 MG PO TABS: ORAL_TABLET | ORAL | 0 refills | Status: DC

## 2021-02-09 MED ORDER — ACYCLOVIR 5 % EX OINT: TOPICAL_OINTMENT | CUTANEOUS | 0 refills | Status: DC

## 2021-02-09 NOTE — Telephone Encounter (Signed)
Pt states she is having a herpes outbreak which is unusual for her, states it's because of the stress she is under and she is going out of the country tomorrow.  She has been taking the Valtrex tablets but would also like the cream to use also to help because it's a unusually bad outbreak and she is going out of the country.  Please send in ASAP to [facility].

## 2021-02-09 NOTE — Telephone Encounter (Signed)
Pt called for Kristi Avila. Please send medication to [facility] [address]

## 2021-02-09 NOTE — Addendum Note (Signed)
Addended by: Minette Headland A on: 02/09/2021 01:32 PM   Modules accepted: Orders

## 2021-02-09 NOTE — Telephone Encounter (Signed)
done

## 2021-02-09 NOTE — Addendum Note (Signed)
Addended by: Minette Headland A on: 02/09/2021 10:03 AM   Modules accepted: Orders

## 2021-02-10 NOTE — Telephone Encounter (Signed)
Sent pt mychart message

## 2021-02-23 DIAGNOSIS — Z124 Encounter for screening for malignant neoplasm of cervix: Secondary | ICD-10-CM | POA: Diagnosis not present

## 2021-02-23 DIAGNOSIS — Z6835 Body mass index (BMI) 35.0-35.9, adult: Secondary | ICD-10-CM | POA: Diagnosis not present

## 2021-02-23 DIAGNOSIS — Z01411 Encounter for gynecological examination (general) (routine) with abnormal findings: Secondary | ICD-10-CM | POA: Diagnosis not present

## 2021-02-23 DIAGNOSIS — Z01419 Encounter for gynecological examination (general) (routine) without abnormal findings: Secondary | ICD-10-CM | POA: Diagnosis not present

## 2021-02-23 LAB — RESULTS CONSOLE HPV: CHL HPV: POSITIVE

## 2021-02-23 LAB — HM PAP SMEAR: HM Pap smear: NEGATIVE

## 2021-02-28 DIAGNOSIS — L820 Inflamed seborrheic keratosis: Secondary | ICD-10-CM | POA: Diagnosis not present

## 2021-02-28 DIAGNOSIS — C44729 Squamous cell carcinoma of skin of left lower limb, including hip: Secondary | ICD-10-CM | POA: Diagnosis not present

## 2021-02-28 DIAGNOSIS — C44529 Squamous cell carcinoma of skin of other part of trunk: Secondary | ICD-10-CM | POA: Diagnosis not present

## 2021-03-09 DIAGNOSIS — M5416 Radiculopathy, lumbar region: Secondary | ICD-10-CM | POA: Diagnosis not present

## 2021-03-15 DIAGNOSIS — B977 Papillomavirus as the cause of diseases classified elsewhere: Secondary | ICD-10-CM | POA: Diagnosis not present

## 2021-03-15 DIAGNOSIS — R87810 Cervical high risk human papillomavirus (HPV) DNA test positive: Secondary | ICD-10-CM | POA: Diagnosis not present

## 2021-03-16 DIAGNOSIS — L570 Actinic keratosis: Secondary | ICD-10-CM | POA: Diagnosis not present

## 2021-03-16 DIAGNOSIS — D0461 Carcinoma in situ of skin of right upper limb, including shoulder: Secondary | ICD-10-CM | POA: Diagnosis not present

## 2021-03-30 DIAGNOSIS — D0472 Carcinoma in situ of skin of left lower limb, including hip: Secondary | ICD-10-CM | POA: Diagnosis not present

## 2021-04-07 ENCOUNTER — Encounter: Payer: Self-pay | Admitting: Medical

## 2021-04-19 NOTE — Progress Notes (Signed)
?Start time: 10:05 Sound wasn't working when video was working.  Completed via telephone (which made her video black-out). ?End time: 10:33 ? ?Virtual Visit via Video Note ? ?I connected with Kristi Avila on 04/20/21 by a video enabled telemedicine application and verified that I am speaking with the correct person using two identifiers. ? ?Location: ?Patient: home ?Provider: office ?  ?I discussed the limitations of evaluation and management by telemedicine and the availability of in person appointments. The patient expressed understanding and agreed to proceed. ? ?History of Present Illness: ? ?Chief Complaint  ?Patient presents with  ? Diarrhea  ?  VIRTUAL diarrhea. Was out of the country in [place] for the last 2 weeks on a cruise. Diarrhea started last Wed evening. She [place] Zpak [place] a [place]-she took is Thursday and Friday. Switched to Cipro '500mg'$  BID Fri-Mon. Helped and stopped it-was also taking Pepto, Imodium and Pedialyte. Completely stopped and started again yesterday. Started the Cipro again yesterday but her fear is that once she stops the Cipro the diarrhea will come back. Stools are soft and dark black. Has taken Pepto and  ? ?Diarrhea started 3/29, while on vacation in [place]. She was taking 6-7 immodium daily. She realized this wasn't good for her, so she then started also taking Pepto Bismol.  ?She googled her symptoms, and while in [place] bought z-pak, took 1 pill ('250mg'$ ) daily on Thurs and Friday only.  It didn't help, still having stools that were "black water". When in port, she called her doctor friend in [place].  She had Cipro with her, and he told her to take it '500mg'$  BID x 3 days.  She took this Friday afternoon through Monday morning. Her diarrhea had improved.   ?Yesterday morning she woke up with diarrhea again. ?While waiting for appointment today, yesterday she took 3 immodium, 2 pepto bismol, and yesterday afternoon she took cipro '500mg'$ , and she took another dose this  morning. ?She describes her bowel movement this morning as a runny "black cow patty". ? ?She has been eating dairy, even though she is already lactose intolerant. She uses Lactaid tablets prior to dairy, but didn't yesterday. She had some cheese on sandwich, and a lot of cheese on the salad. ? ?PMH, PSH, SH reviewed ? ?Outpatient Encounter Medications as of 04/20/2021  ?Medication Sig Note  ? bismuth subsalicylate (PEPTO BISMOL) 262 MG chewable tablet Chew 524 mg by mouth as needed. 04/20/2021: Last dose yesterday afternoon  ? cholecalciferol (VITAMIN D3) 25 MCG (1000 UNIT) tablet Take 1,000 Units by mouth daily.   ? ciprofloxacin (CIPRO) 500 MG tablet Take 500 mg by mouth 2 (two) times daily.   ? DEXILANT 60 MG capsule TAKE 1 CAPSULE BY MOUTH EVERY DAY   ? DULoxetine (CYMBALTA) 60 MG capsule TAKE 1 CAPSULE BY MOUTH TWICE DAILY   ? ezetimibe (ZETIA) 10 MG tablet Take 1 tablet (10 mg total) by mouth daily.   ? famotidine (PEPCID) 20 MG tablet Take 1 tablet (20 mg total) by mouth at bedtime.   ? loperamide (IMODIUM A-D) 2 MG tablet Take 6 mg by mouth 4 (four) times daily as needed for diarrhea or loose stools. 04/20/2021: Last dose yesterday afternoon  ? meloxicam (MOBIC) 15 MG tablet TAKE 1 TABLET(15 MG) BY MOUTH DAILY   ? MULTIPLE VITAMIN PO Take 1 tablet by mouth daily.   ? Plecanatide (TRULANCE) 3 MG TABS Take 1 tablet by mouth daily.   ? rosuvastatin (CRESTOR) 5 MG  tablet Take 1 tablet (5 mg total) by mouth every other day.   ? valACYclovir (VALTREX) 500 MG tablet TAKE 1 TABLET(500 MG) BY MOUTH DAILY   ? acyclovir ointment (ZOVIRAX) 5 % Apply thin layer to affected area (Patient not taking: Reported on 04/20/2021) 04/20/2021: prn  ? Magnesium 400 MG TABS Take 1 capsule by mouth 2 (two) times daily. (Patient not taking: Reported on 04/20/2021) 04/20/2021: prn  ? psyllium (METAMUCIL) 58.6 % powder Take by mouth. 2 teaspoons nightly (Patient not taking: Reported on 04/20/2021) 04/20/2021: prn  ? [DISCONTINUED] ALPRAZolam (XANAX) 0.5  MG tablet Take 1-2 pills as needed on call to MRI. (Patient not taking: Reported on 04/20/2021)   ? [DISCONTINUED] Cyanocobalamin (VITAMIN B 12 PO) Take 1 tablet by mouth daily.   ? [DISCONTINUED] LAGEVRIO 200 MG CAPS capsule TAKE 4 CAPSULES(800 MG) BY MOUTH TWICE DAILY FOR 5 DAYS   ? [DISCONTINUED] ramelteon (ROZEREM) 8 MG tablet TAKE 1 TABLET(8 MG) BY MOUTH AT BEDTIME (Patient not taking: Reported on 11/30/2020)   ? [DISCONTINUED] tolterodine (DETROL LA) 2 MG 24 hr capsule TAKE 1 CAPSULE(2 MG) BY MOUTH DAILY   ? ?No facility-administered encounter medications on file as of 04/20/2021.  ? ?Allergies  ?Allergen Reactions  ? Cephalosporins Diarrhea  ? Codeine Itching  ? Latex Itching  ? Pravastatin   ?  Myalgias  ? Simvastatin   ?  myalgias  ? Sulfa Antibiotics Diarrhea  ? ?ROS:  diarrhea per HPI.  No f/c. No n/v ?No bloody stools, just black (taking pepto bismol) ?When diarrhea was better, and not taking pepto, stool was very soft, but back to normal color. ?Denies abdominal pain/cramping. ?No dysuria, no vaginal discharge or itching. ?No headache, dizziness, chest pain, shortness of breath or rash. ? ?  ?Observations/Objective: ? ?Temp (!) 96.3 ?F (35.7 ?C) (Oral)   Ht '5\' 6"'$  (1.676 m)   Wt 215 lb (97.5 kg)   LMP 12/31/2019   BMI 34.70 kg/m?  ? ?Pleasant, well-appearing female in no distress ?She is alert, oriented. Cranial nerves grossly intact. ?Exam is limited due to the virtual nature of the visit ? ?Assessment and Plan: ? ?Diarrhea, unspecified type - started as typical traveler's diarrhea. Improved, but had some recurrence.  Doubt infectious at this point.  stop ABX/ BRAT, imodium, no dairy, probiotic. ? ?Stay well hydrated ?Stop dairy ?BRAT diet ?Probiotic ?Stop the Cipro ?Stool studies if diarrhea persists ?Continue imodium as needed; use pepto only if needed. ? ?Fever, abdominal pain, bloody stools, dizzy/dehydrated ? ?Questions regarding Ozempic and wt loss meds ?Given name of [name] and Saxenda, and to  check her insurance to see if she has weight loss benefit, then schedule visit with Audelia Acton to discuss in more detail, if interested/covered ? ? ?Follow Up Instructions: ? ?  ?I discussed the assessment and treatment plan with the patient. The patient was provided an opportunity to ask questions and all were answered. The patient agreed with the plan and demonstrated an understanding of the instructions. ?  ?The patient was advised to call back or seek an in-person evaluation if the symptoms worsen or if the condition fails to improve as anticipated. ? ?I spent 35 minutes dedicated to the care of this patient, including pre-visit review of records, face to face time, post-visit ordering of testing and documentation. ? ? ? ?Vikki Ports, MD ?

## 2021-04-20 ENCOUNTER — Telehealth (INDEPENDENT_AMBULATORY_CARE_PROVIDER_SITE_OTHER): Payer: BC Managed Care – PPO | Admitting: Family Medicine

## 2021-04-20 ENCOUNTER — Encounter: Payer: Self-pay | Admitting: Family Medicine

## 2021-04-20 VITALS — Temp 96.3°F | Ht 66.0 in | Wt 215.0 lb

## 2021-04-20 DIAGNOSIS — R197 Diarrhea, unspecified: Secondary | ICD-10-CM | POA: Diagnosis not present

## 2021-04-20 NOTE — Patient Instructions (Signed)
Stay well hydrated--drink plenty of water. Your urine should be very pale yellow or clear. ?Stop dairy--avoid cheese, milk.  This will worsen the diarrhea.  Hold off on dairy (or take Lactaid tablets) for a least a few days after diarrhea completely resolves. ?BRAT diet will help make the stools more solid (bananas, rice, applesauce and toast).  You can expand beyond these foods, but keep them very bland (ie grilled chicken; nothing spicy, greasy, fried, etc). ?Start a daily probiotic for at least 2 weeks.  This should help re-set the bowels normal flora. ?Stop the Cipro. You had a full course for potential traveler's diarrhea.  It might make things worse rather than better. ? ?If you have ongoing issues with diarrhea, we will want to do some stool studies (looking for ova, parasites, C. Diff, and bacterial cultures). ?Continue imodium as needed; use pepto only if needed. ? ?Seek immediate care if you develop fever, abdominal pain, bloody stools, dizzy/dehydrated, or other concerning symptoms. ? ?At the end of the visit you were asking about weight loss medications. ?Ozempic is only for diabetes.  [name] and Kirke Shaggy are similar injections (one is weekly, the other is daily injections), that are approved for weight loss.  You should check with your insurance to see if you have a weight loss benefit (in which case these might be covered, whereas they will not be if you don't have a weight loss benefit).  If covered, you should schedule an appointment with Audelia Acton to further discuss these medications. ? ?It was a pleasure meeting you.  I hope you feel better soon! ? ? ?

## 2021-04-22 ENCOUNTER — Other Ambulatory Visit: Payer: Self-pay | Admitting: Medical

## 2021-04-27 DIAGNOSIS — L309 Dermatitis, unspecified: Secondary | ICD-10-CM | POA: Diagnosis not present

## 2021-04-27 DIAGNOSIS — L821 Other seborrheic keratosis: Secondary | ICD-10-CM | POA: Diagnosis not present

## 2021-04-27 DIAGNOSIS — Z85828 Personal history of other malignant neoplasm of skin: Secondary | ICD-10-CM | POA: Diagnosis not present

## 2021-04-27 DIAGNOSIS — L570 Actinic keratosis: Secondary | ICD-10-CM | POA: Diagnosis not present

## 2021-04-27 DIAGNOSIS — L814 Other melanin hyperpigmentation: Secondary | ICD-10-CM | POA: Diagnosis not present

## 2021-05-03 ENCOUNTER — Other Ambulatory Visit: Payer: Self-pay | Admitting: Medical

## 2021-05-03 DIAGNOSIS — N952 Postmenopausal atrophic vaginitis: Secondary | ICD-10-CM | POA: Diagnosis not present

## 2021-05-03 DIAGNOSIS — R351 Nocturia: Secondary | ICD-10-CM | POA: Diagnosis not present

## 2021-05-03 DIAGNOSIS — N9089 Other specified noninflammatory disorders of vulva and perineum: Secondary | ICD-10-CM | POA: Diagnosis not present

## 2021-05-03 DIAGNOSIS — R350 Frequency of micturition: Secondary | ICD-10-CM | POA: Diagnosis not present

## 2021-05-06 ENCOUNTER — Other Ambulatory Visit: Payer: Self-pay | Admitting: Medical

## 2021-05-10 DIAGNOSIS — H903 Sensorineural hearing loss, bilateral: Secondary | ICD-10-CM | POA: Diagnosis not present

## 2021-05-22 ENCOUNTER — Other Ambulatory Visit: Payer: Self-pay | Admitting: Medical

## 2021-05-22 DIAGNOSIS — M5030 Other cervical disc degeneration, unspecified cervical region: Secondary | ICD-10-CM | POA: Diagnosis not present

## 2021-05-22 DIAGNOSIS — M5136 Other intervertebral disc degeneration, lumbar region: Secondary | ICD-10-CM | POA: Diagnosis not present

## 2021-05-30 ENCOUNTER — Ambulatory Visit: Payer: BC Managed Care – PPO | Admitting: Physical Therapy

## 2021-06-02 DIAGNOSIS — M5451 Vertebrogenic low back pain: Secondary | ICD-10-CM | POA: Diagnosis not present

## 2021-06-06 DIAGNOSIS — M5451 Vertebrogenic low back pain: Secondary | ICD-10-CM | POA: Diagnosis not present

## 2021-06-08 DIAGNOSIS — Z96642 Presence of left artificial hip joint: Secondary | ICD-10-CM | POA: Diagnosis not present

## 2021-06-08 DIAGNOSIS — Z96653 Presence of artificial knee joint, bilateral: Secondary | ICD-10-CM | POA: Diagnosis not present

## 2021-06-19 DIAGNOSIS — M5416 Radiculopathy, lumbar region: Secondary | ICD-10-CM | POA: Diagnosis not present

## 2021-06-21 DIAGNOSIS — M5451 Vertebrogenic low back pain: Secondary | ICD-10-CM | POA: Diagnosis not present

## 2021-06-22 ENCOUNTER — Ambulatory Visit: Payer: BC Managed Care – PPO | Attending: Obstetrics and Gynecology

## 2021-06-22 DIAGNOSIS — M6281 Muscle weakness (generalized): Secondary | ICD-10-CM | POA: Diagnosis not present

## 2021-06-22 DIAGNOSIS — R293 Abnormal posture: Secondary | ICD-10-CM | POA: Diagnosis not present

## 2021-06-22 DIAGNOSIS — R279 Unspecified lack of coordination: Secondary | ICD-10-CM | POA: Diagnosis not present

## 2021-06-22 NOTE — Therapy (Addendum)
OUTPATIENT PHYSICAL THERAPY FEMALE PELVIC EVALUATION   Patient Name: Kristi Avila MRN: [id number] DOB:Sep 07, 1955, 66 y.o., female Today's Date: 06/22/2021   PT End of Session - 06/22/21 1723     Visit Number 1    Date for PT Re-Evaluation 09/14/21    Authorization Type BCBS    PT Start Time 1530    PT Stop Time 1621    PT Time Calculation (min) 51 min    Activity Tolerance Patient tolerated treatment well    Behavior During Therapy [facility] for tasks assessed/performed             Past Medical History:  Diagnosis Date   Arthritis    Barrett's esophagus with esophagitis 07/23/12   EGD: CLO test results negative. pathology: Barretss: Repeat EGD 3 years   Blood transfusion without reported diagnosis    remote past   Depression    Family history of anesthesia complication    mother trouble waking up   Frequency of urination    GERD (gastroesophageal reflux disease)    h/o peptic ulcer   High cholesterol    HSV-2 infection    anal   Nocturia    Obesity    Overactive bladder    Reflux    Urgency of urination    Past Surgical History:  Procedure Laterality Date   ARTHROSCOPY KNEE W/ DRILLING     X4 ON RT KNEE   CARPAL TUNNEL RELEASE Right 2000   colonscopy  2007, 2017   ESOPHAGOGASTRODUODENOSCOPY  2007   Dr. Ferdinand Lango, [place]   foot fibroma surgery  all before 1985   x 3   HEMORRHOID SURGERY  04/2006   HIP FRACTURE SURGERY Right    KNEE ARTHROSCOPY     X2 LEFT KNEE   SINUS EXPLORATION  1995   TOE AMPUTATION  1959   TOTAL KNEE ARTHROPLASTY Right 09/22/2012   Procedure: RIGHT TOTAL KNEE ARTHROPLASTY;  Surgeon: [name], MD;  Location: [facility];  Service: Orthopedics;  Laterality: Right;   TOTAL KNEE ARTHROPLASTY Left 09/28/2013   Procedure: LEFT TOTAL KNEE ARTHROPLASTY, CORTISONE INJECTION RIGHT HIP;  Surgeon: [name], MD;  Location: [facility];  Service: Orthopedics;  Laterality: Left;   Patient Active Problem List   Diagnosis Date Noted   Calculus of  gallbladder without cholecystitis without obstruction 11/15/2020   RUQ pain 11/15/2020   Ataxia 11/15/2020   Chronic constipation 11/15/2020   Chronic insomnia 09/09/2020   Sleep disturbance 11/02/2019   Screening for diabetes mellitus 09/15/2019   Chronic low back pain 03/25/2019   Stress incontinence 11/07/2018   Need for influenza vaccination 09/08/2018   Vaccine counseling 09/08/2018   Family history of osteoporosis 09/08/2018   Estrogen deficiency 09/08/2018   Post-menopausal 09/08/2018   Encounter for health maintenance examination in adult 08/07/2018   Barrett's esophagus with dysplasia 03/07/2018   Insomnia 03/07/2018   Osteoarthritis of multiple joints 09/12/2012   HSV-2 infection    Hyperlipidemia    Overactive bladder    Obesity    Anxiety and depression    GERD (gastroesophageal reflux disease)     PCP: Carlena Hurl, PA-C  REFERRING PROVIDER: Avon Gully, NP  REFERRING DIAG: N32.81 (ICD-10-CM) - Overactive bladder  THERAPY DIAG:  Abnormal posture  Muscle weakness (generalized)  Unspecified lack of coordination  Rationale for Evaluation and Treatment Rehabilitation  ONSET DATE: 5 years ago  SUBJECTIVE:  SUBJECTIVE STATEMENT: Patient states that she is having trouble at night dribbling after she goes to the bathroom. She will also go to the bathroom 5 times in the several hours prior to going to bed when she is just relaxing. She went for one pelvic floor PT visit years ago, but never returned. She is also in therapy for low back to help her spinal stenosis and L4/5. Fluid intake: Yes: 100oz of water a day; large diet drink during the day - no caffeine after 2 in the afternoon; occasional alcohol consumption on the weekends    Patient confirms identification and  approves PT to assess pelvic floor and treatment Yes   PAIN:  Are you having pain? Yes NPRS scale: 5/10 Pain location: Lt lower quadrant pain; low back pain  Pain type: aching Pain description: intermittent   Aggravating factors: not sure Relieving factors: in PT for low back  PRECAUTIONS: None  WEIGHT BEARING RESTRICTIONS No  FALLS:  Has patient fallen in last 6 months? Yes. Number of falls 1x, tripped  LIVING ENVIRONMENT: Lives with: lives with their family Lives in: House/apartment   OCCUPATION: Quarry manager  PLOF: Independent  PATIENT GOALS decrease dribbling at night, urinary frequency, urgency  PERTINENT HISTORY:  Bil THA/TKA Sexual abuse: No  BOWEL MOVEMENT Pain with bowel movement: No Type of bowel movement:Frequency every several days to 4x/day and Strain Yes Fully empty rectum: Yes: sometimes Leakage: No Pads: No Fiber supplement: Yes: metamucil Sometimes has to splint Sometimes will have urgency  URINATION Pain with urination: No Fully empty bladder: No Stream: Weak - has to strain to pee Urgency: Yes: - Frequency: Every 2/5-3 hours during the day, hardly at all in the morning, and then 5 times before bed; 2-3x/night Leakage: Urge to void Pads: No  INTERCOURSE Pain with intercourse:  None Ability to have vaginal penetration:  Yes: - Climax: yes, nonpainful Marinoff Scale: 0/3  PREGNANCY Vaginal deliveries 0 C-section deliveries 0   PROLAPSE Heaviness in lower abdomen, worse with legs crossed   OBJECTIVE:   COGNITION:  Overall cognitive status: Within functional limits for tasks assessed     SENSATION:  Light touch: Appears intact  Proprioception: Appears intact  GAIT:  Comments: WNL  POSTURE:  Decreased lumbar lordosis, posterior pelvic tilt   PELVIC MMT: 2/5 strength, poor coordination - significant abdominal contribution 4 second endurance 4 repeat contractions        PALPATION:   General  WNL                 External Perineal Exam redness/dryness                             Internal Pelvic Floor superficial tenderness most likely associated with vaginal wall thinning  TONE: WNL  PROLAPSE: None seen today, but patient had significant difficulty with coordination of bearing down - will continue to attempt assessment of this due to subjective exam that aligns with some amount of anterior/posterior vaginal wall laxity  TODAY'S TREATMENT  EVAL  Neuromuscular re-education: Pelvic floor contraction training: Quick flicks 2 x 10 with multimodal cues for improved coordination/motor control Long holds 5 x 10 seconds with multimodal cues for improved coordination/motor control Self-care: Urge suppression technique    PATIENT EDUCATION:  Education details: Initial HEP, see above self-care Person educated: Patient Education method: Explanation, Demonstration, Tactile cues, Verbal cues, and Handouts Education comprehension: verbalized understanding   HOME EXERCISE PROGRAM: [id number]  ASSESSMENT:  CLINICAL IMPRESSION: Patient is a 66 y.o. female who was seen today for physical therapy evaluation and treatment for urinary frequency. Exam findings notable for pelvic floor weakness 2/5, decreased pelvic floor endurance 4 seconds, poor coordination and only 4 pelvic floor repetitions, and superficial vaginal tenderness/dryness. Signs and symptoms most consistent with pelvic floor weakness, poor coordination, and poor bladder habits. Initial treatment consisted of pelvic floor contraction training and urge suppression technique to begin gaining control over urgency and incontinence. She will benefit from skilled PT intervention in order to decrease urinary urgency/incontinence, improve bladder habits, and improve QOL.    OBJECTIVE IMPAIRMENTS decreased coordination, decreased endurance, decreased mobility, decreased strength, increased muscle spasms, postural dysfunction, and pain.   ACTIVITY  LIMITATIONS continence  PARTICIPATION LIMITATIONS: interpersonal relationship and community activity  PERSONAL FACTORS 1 comorbidity: Bil TKA/THA  are also affecting patient's functional outcome.   REHAB POTENTIAL: Good  CLINICAL DECISION MAKING: Stable/uncomplicated  EVALUATION COMPLEXITY: Low   GOALS: Goals reviewed with patient? Yes  SHORT TERM GOALS: Target date: 07/20/2021  Pt will be independent with HEP.   Baseline: Goal status: INITIAL  2.  Pt will be independent with the knack, urge suppression technique, and double voiding in order to improve bladder habits and decrease urinary incontinence.   Baseline:  Goal status: INITIAL  3.  Pt will be able to correctly perform diaphragmatic breathing and appropriate pressure management in order to prevent worsening vaginal wall laxity and improve pelvic floor A/ROM.   Baseline:  Goal status: INITIAL    LONG TERM GOALS: Target date: 09/14/2021   Pt will be independent with advanced HEP.   Baseline:  Goal status: INITIAL  2.  Pt will demonstrate normal pelvic floor muscle tone and A/ROM, able to achieve 4/5 strength with contractions and 10 sec endurance, in order to provide appropriate lumbopelvic support in functional activities.   Baseline:  Goal status: INITIAL  3.  Patient will report no lower abdominal pain greater than 1/10.  Baseline:  Goal status: INITIAL  4.  Pt will be able to go 2-3 hours in between voids without urgency or incontinence in order to improve QOL and perform all functional activities with less difficulty.   Baseline:  Goal status: INITIAL  5.  Pt will decrease frequency of nightly trips to the bathroom to 1 or less in order to get restful sleep and only one trip to the bathroom before bed without leaking.    Baseline:  Goal status: INITIAL    PLAN: PT FREQUENCY: 1x/week  PT DURATION: 12 weeks  PLANNED INTERVENTIONS: Therapeutic exercises, Therapeutic activity, Neuromuscular  re-education, Balance training, Gait training, Patient/Family education, Joint mobilization, Dry Needling, Biofeedback, and Manual therapy  PLAN FOR NEXT SESSION: Plan to perform strict bladder retraining; begin specific core retraining and exercise progressions.  Heather Roberts, PT, DPT06/08/235:34 PM

## 2021-06-22 NOTE — Patient Instructions (Signed)
Urge suppression technique: A technique to help you hold urine until it's an appropriate time to go, whether this is making it home or trying to reach a specific voiding time frame according to your schedule. It helps to send signals from the bladder to the brain that say you don't actually have to void urine right now. This most likely only give you several minutes of relief at first, but repeat as needed; the benefit will last longer as you use this technique more and get into better bladder habits. ?  The technique: o Perform 5 quick flicks (Kegels) rapidly, not worrying about fully relaxing in between each (only in this technique). o Then perform several deep belly breaths while focusing on relaxing the pelvic floor. o Go do something else to help distract yourself from the urge to urinate. o Repeat as needed.   [facility] [address], [address] [place], [place] [address] Phone # [phone] Fax [phone]  

## 2021-06-28 DIAGNOSIS — M5451 Vertebrogenic low back pain: Secondary | ICD-10-CM | POA: Diagnosis not present

## 2021-06-29 DIAGNOSIS — Z6835 Body mass index (BMI) 35.0-35.9, adult: Secondary | ICD-10-CM | POA: Diagnosis not present

## 2021-06-29 DIAGNOSIS — M4310 Spondylolisthesis, site unspecified: Secondary | ICD-10-CM | POA: Diagnosis not present

## 2021-07-06 ENCOUNTER — Ambulatory Visit: Payer: BC Managed Care – PPO

## 2021-07-12 ENCOUNTER — Ambulatory Visit: Payer: BC Managed Care – PPO

## 2021-07-26 ENCOUNTER — Other Ambulatory Visit: Payer: Self-pay | Admitting: Medical

## 2021-07-26 ENCOUNTER — Ambulatory Visit: Payer: BC Managed Care – PPO | Attending: Obstetrics and Gynecology

## 2021-07-26 DIAGNOSIS — L820 Inflamed seborrheic keratosis: Secondary | ICD-10-CM | POA: Diagnosis not present

## 2021-07-26 DIAGNOSIS — M6281 Muscle weakness (generalized): Secondary | ICD-10-CM | POA: Diagnosis not present

## 2021-07-26 DIAGNOSIS — R279 Unspecified lack of coordination: Secondary | ICD-10-CM | POA: Diagnosis not present

## 2021-07-26 DIAGNOSIS — R293 Abnormal posture: Secondary | ICD-10-CM | POA: Insufficient documentation

## 2021-07-26 DIAGNOSIS — Z85828 Personal history of other malignant neoplasm of skin: Secondary | ICD-10-CM | POA: Diagnosis not present

## 2021-07-26 DIAGNOSIS — L821 Other seborrheic keratosis: Secondary | ICD-10-CM | POA: Diagnosis not present

## 2021-07-26 DIAGNOSIS — L570 Actinic keratosis: Secondary | ICD-10-CM | POA: Diagnosis not present

## 2021-07-26 NOTE — Patient Instructions (Addendum)
Squatty potty: When your knees are level or below the level of your hips, pelvic floor muscles are pressed against rectum, preventing ease of bowel movement. By getting knees above the level of the hips, these pelvic floor muscles relax, allowing easier passage of bowel movement. ? Ways to get knees above hips: o Squatty Potty (7inch and 9inch versions) o Small stool o Roll of toilet paper under each foot o Hardback book or stack of magazines under each foot  Relaxed Toileting mechanics: Once in this position, make sure to lean forward with forearms on thighs, wide knees, relaxed stomach, and breathe.   Bowel massage: To assist with more regular and more comfortable bowel movements, try performing bowel massage nightly for 5-10 minutes. Place hands in the lower right side of your abdomen to start; in small circles, massage up, across, and down the left side of your abdomen. Pressure does not need to be hard, but just comfortable. You can use lotion or oil to make more comfortable.   Bladder/bowel retraining:  Drink 4-8oz an hour ONLY WATER - don't worry about this so much Stop water intake 3 hours before bed Try to go at least 2-3 hours between trips to the bathroom - go whether you need to or not.  For two weeks.   [facility] [address] [place], [place] [address] Phone # [phone] Fax [phone]

## 2021-07-26 NOTE — Therapy (Signed)
OUTPATIENT PHYSICAL THERAPY TREATMENT NOTE   Patient Name: Kristi Avila MRN: [id number] DOB:Sep 27, 1955, 66 y.o., female Today's Date: 07/26/2021  PCP: Carlena Hurl, PA-C REFERRING PROVIDER: Avon Gully, NP  END OF SESSION:   PT End of Session - 07/26/21 1402     Visit Number 2    Date for PT Re-Evaluation 09/14/21    Authorization Type BCBS    PT Start Time 1400    PT Stop Time 1440    PT Time Calculation (min) 40 min    Activity Tolerance Patient tolerated treatment well    Behavior During Therapy [facility] for tasks assessed/performed             Past Medical History:  Diagnosis Date   Arthritis    Barrett's esophagus with esophagitis 07/23/12   EGD: CLO test results negative. pathology: Barretss: Repeat EGD 3 years   Blood transfusion without reported diagnosis    remote past   Depression    Family history of anesthesia complication    mother trouble waking up   Frequency of urination    GERD (gastroesophageal reflux disease)    h/o peptic ulcer   High cholesterol    HSV-2 infection    anal   Nocturia    Obesity    Overactive bladder    Reflux    Urgency of urination    Past Surgical History:  Procedure Laterality Date   ARTHROSCOPY KNEE W/ DRILLING     X4 ON RT KNEE   CARPAL TUNNEL RELEASE Right 2000   colonscopy  2007, 2017   ESOPHAGOGASTRODUODENOSCOPY  2007   Dr. Ferdinand Lango, [place]   foot fibroma surgery  all before 1985   x 3   HEMORRHOID SURGERY  04/2006   HIP FRACTURE SURGERY Right    KNEE ARTHROSCOPY     X2 LEFT KNEE   SINUS EXPLORATION  1995   TOE AMPUTATION  1959   TOTAL KNEE ARTHROPLASTY Right 09/22/2012   Procedure: RIGHT TOTAL KNEE ARTHROPLASTY;  Surgeon: [name], MD;  Location: [facility];  Service: Orthopedics;  Laterality: Right;   TOTAL KNEE ARTHROPLASTY Left 09/28/2013   Procedure: LEFT TOTAL KNEE ARTHROPLASTY, CORTISONE INJECTION RIGHT HIP;  Surgeon: [name], MD;  Location: [facility];  Service: Orthopedics;   Laterality: Left;   Patient Active Problem List   Diagnosis Date Noted   Calculus of gallbladder without cholecystitis without obstruction 11/15/2020   RUQ pain 11/15/2020   Ataxia 11/15/2020   Chronic constipation 11/15/2020   Chronic insomnia 09/09/2020   Sleep disturbance 11/02/2019   Screening for diabetes mellitus 09/15/2019   Chronic low back pain 03/25/2019   Stress incontinence 11/07/2018   Need for influenza vaccination 09/08/2018   Vaccine counseling 09/08/2018   Family history of osteoporosis 09/08/2018   Estrogen deficiency 09/08/2018   Post-menopausal 09/08/2018   Encounter for health maintenance examination in adult 08/07/2018   Barrett's esophagus with dysplasia 03/07/2018   Insomnia 03/07/2018   Osteoarthritis of multiple joints 09/12/2012   HSV-2 infection    Hyperlipidemia    Overactive bladder    Obesity    Anxiety and depression    GERD (gastroesophageal reflux disease)     REFERRING DIAG: N32.81 (ICD-10-CM) - Overactive bladder  THERAPY DIAG:  Abnormal posture  Muscle weakness (generalized)  Unspecified lack of coordination  Rationale for Evaluation and Treatment Rehabilitation  PERTINENT HISTORY: Bil THA/TKA  PRECAUTIONS: NA  SUBJECTIVE: Pt states that she is feeling better and feels like the dribble  has subsided some. She has been working on exercises. She is also in PT for low back. She has not had any pain in lower abdomen. She states that she is down to 1x urination at night and she has noticed great improvement in multiple trips to the bathroom prior to going to bed. She does continue to have labial dryness even with estrogen cream.   PAIN:  Are you having pain? No   THERAPY DIAG:  Abnormal posture   Muscle weakness (generalized)   Unspecified lack of coordination   Rationale for Evaluation and Treatment Rehabilitation   ONSET DATE: 5 years ago   SUBJECTIVE 06/22/21:                                                                                                                                                                                             SUBJECTIVE STATEMENT: Patient states that she is having trouble at night dribbling after she goes to the bathroom. She will also go to the bathroom 5 times in the several hours prior to going to bed when she is just relaxing. She went for one pelvic floor PT visit years ago, but never returned. She is also in therapy for low back to help her spinal stenosis and L4/5. Fluid intake: Yes: 100oz of water a day; large diet drink during the day - no caffeine after 2 in the afternoon; occasional alcohol consumption on the weekends     Patient confirms identification and approves PT to assess pelvic floor and treatment Yes     PAIN:  Are you having pain? Yes NPRS scale: 5/10 Pain location: Lt lower quadrant pain; low back pain   Pain type: aching Pain description: intermittent    Aggravating factors: not sure Relieving factors: in PT for low back      PATIENT GOALS decrease dribbling at night, urinary frequency, urgency   BOWEL MOVEMENT Pain with bowel movement: No Type of bowel movement:Frequency every several days to 4x/day and Strain Yes Fully empty rectum: Yes: sometimes Leakage: No Pads: No Fiber supplement: Yes: metamucil Sometimes has to splint Sometimes will have urgency   URINATION Pain with urination: No Fully empty bladder: No Stream: Weak - has to strain to pee Urgency: Yes: - Frequency: Every 2/5-3 hours during the day, hardly at all in the morning, and then 5 times before bed; 2-3x/night Leakage: Urge to void Pads: No   INTERCOURSE Pain with intercourse:  None Ability to have vaginal penetration:  Yes: - Climax: yes, nonpainful Marinoff Scale: 0/3   PREGNANCY Vaginal deliveries 0 C-section deliveries 0     PROLAPSE Heaviness in  lower abdomen, worse with legs crossed     OBJECTIVE 06/22/21:    COGNITION:            Overall cognitive  status: Within functional limits for tasks assessed                          SENSATION:            Light touch: Appears intact            Proprioception: Appears intact   GAIT:   Comments: WNL   POSTURE:  Decreased lumbar lordosis, posterior pelvic tilt     PELVIC MMT: 2/5 strength, poor coordination - significant abdominal contribution 4 second endurance 4 repeat contractions         PALPATION:   General  WNL                 External Perineal Exam redness/dryness                             Internal Pelvic Floor superficial tenderness most likely associated with vaginal wall thinning   TONE: WNL   PROLAPSE: None seen today, but patient had significant difficulty with coordination of bearing down - will continue to attempt assessment of this due to subjective exam that aligns with some amount of anterior/posterior vaginal wall laxity   TODAY'S TREATMENT 07/26/21 Neuromuscular re-education: Core retraining:  Transversus abdominus training with multimodal cues for improved motor control and breath coordination Core facilitation: Supine march 2 x 10 Leg extensions 10x bil Exercises: Strengthening: Bridge with hip adduction 10x Self-care: Not using soap vaginally Squatty potty/relaxed toileting mechanics Strict bladder retraining Self-bowel massage  TREATMENT 06/22/21 EVAL  Neuromuscular re-education: Pelvic floor contraction training: Quick flicks 2 x 10 with multimodal cues for improved coordination/motor control Long holds 5 x 10 seconds with multimodal cues for improved coordination/motor control Self-care: Urge suppression technique       PATIENT EDUCATION:  Education details: Initial HEP, see above self-care Person educated: Patient Education method: Explanation, Demonstration, Tactile cues, Verbal cues, and Handouts Education comprehension: verbalized understanding     HOME EXERCISE PROGRAM: [id number]   ASSESSMENT:   CLINICAL IMPRESSION: Pt has  seen excellent improvements in bladder dysfunction over the last mont demonstrated by 1x/night nocturia, decrease in post-void dribbling, and decrease in frequent trips to the bathroom before bed. She is no longer having any pelvic/lower abdominal pain. We discussed relaxed toileting mechanics and squatty potty to help improve remaining constipation in addition to self-bowel massage. She did well with core training, but did initially bear down and bulge instead of drawing in; with various multimodal cues, she was able to correct and get good core facilitation that she was able to incorporate into other exercises. She will benefit from skilled PT intervention in order to decrease urinary urgency/incontinence, improve bladder habits, and improve QOL.      OBJECTIVE IMPAIRMENTS decreased coordination, decreased endurance, decreased mobility, decreased strength, increased muscle spasms, postural dysfunction, and pain.    ACTIVITY LIMITATIONS continence   PARTICIPATION LIMITATIONS: interpersonal relationship and community activity   PERSONAL FACTORS 1 comorbidity: Bil TKA/THA  are also affecting patient's functional outcome.    REHAB POTENTIAL: Good   CLINICAL DECISION MAKING: Stable/uncomplicated   EVALUATION COMPLEXITY: Low     GOALS: Goals reviewed with patient? Yes   SHORT TERM GOALS: Target date: 07/20/2021 - updated 07/26/21   Pt will be  independent with HEP.    Baseline: Goal status: MET 07/26/21   2.  Pt will be independent with the knack, urge suppression technique, and double voiding in order to improve bladder habits and decrease urinary incontinence.    Baseline:  Goal status: MET 07/26/21   3.  Pt will be able to correctly perform diaphragmatic breathing and appropriate pressure management in order to prevent worsening vaginal wall laxity and improve pelvic floor A/ROM.    Baseline:  Goal status: MET 07/26/21       LONG TERM GOALS: Target date: 09/14/2021 - updated 07/26/21    Pt will be independent with advanced HEP.    Baseline:  Goal status: IN PROGRESS   2.  Pt will demonstrate normal pelvic floor muscle tone and A/ROM, able to achieve 4/5 strength with contractions and 10 sec endurance, in order to provide appropriate lumbopelvic support in functional activities.    Baseline: Not formally assessed today Goal status: IN PROGRESS   3.  Patient will report no lower abdominal pain greater than 1/10.  Baseline:  Goal status: MET 07/26/21   4.  Pt will be able to go 2-3 hours in between voids without urgency or incontinence in order to improve QOL and perform all functional activities with less difficulty.    Baseline:  Goal status: MET 07/26/21   5.  Pt will decrease frequency of nightly trips to the bathroom to 1 or less in order to get restful sleep and only one trip to the bathroom before bed without leaking.      Baseline:  Goal status: MET 07/26/21       PLAN: PT FREQUENCY: 1x/week   PT DURATION: 12 weeks   PLANNED INTERVENTIONS: Therapeutic exercises, Therapeutic activity, Neuromuscular re-education, Balance training, Gait training, Patient/Family education, Joint mobilization, Dry Needling, Biofeedback, and Manual therapy   PLAN FOR NEXT SESSION: Progress functional strengthening.    Heather Roberts, PT, DPT07/12/233:29 PM

## 2021-07-26 NOTE — Telephone Encounter (Signed)
[facility] is requesting to fill pt cymbalta. Please advise [facility]

## 2021-07-30 ENCOUNTER — Other Ambulatory Visit: Payer: Self-pay | Admitting: Medical

## 2021-07-31 NOTE — Telephone Encounter (Signed)
I don't usually keep someone on NSAIDs long term.   If she is still seeing ortho, they should be deciding whether to refill this.   Please advised and/or have pharmacy send refill request to Dr. Lur[facility]

## 2021-08-01 ENCOUNTER — Other Ambulatory Visit: Payer: Self-pay | Admitting: Medical

## 2021-08-01 MED ORDER — MELOXICAM 15 MG PO TABS: ORAL_TABLET | ORAL | 0 refills | Status: DC

## 2021-08-03 ENCOUNTER — Ambulatory Visit: Payer: BC Managed Care – PPO

## 2021-08-03 DIAGNOSIS — M6281 Muscle weakness (generalized): Secondary | ICD-10-CM | POA: Diagnosis not present

## 2021-08-03 DIAGNOSIS — R279 Unspecified lack of coordination: Secondary | ICD-10-CM | POA: Diagnosis not present

## 2021-08-03 DIAGNOSIS — R293 Abnormal posture: Secondary | ICD-10-CM

## 2021-08-03 NOTE — Therapy (Signed)
OUTPATIENT PHYSICAL THERAPY TREATMENT NOTE   Patient Name: Kristi Avila MRN: [id number] DOB:09-May-1955, 66 y.o., female Today's Date: 08/03/2021  PCP: Carlena Hurl, PA-C REFERRING PROVIDER: Avon Gully, NP  END OF SESSION:   PT End of Session - 08/03/21 1536     Visit Number 3    Date for PT Re-Evaluation 09/14/21    Authorization Type BCBS    PT Start Time 1530    PT Stop Time 1610    PT Time Calculation (min) 40 min    Activity Tolerance Patient tolerated treatment well    Behavior During Therapy [facility] for tasks assessed/performed              Past Medical History:  Diagnosis Date   Arthritis    Barrett's esophagus with esophagitis 07/23/12   EGD: CLO test results negative. pathology: Barretss: Repeat EGD 3 years   Blood transfusion without reported diagnosis    remote past   Depression    Family history of anesthesia complication    mother trouble waking up   Frequency of urination    GERD (gastroesophageal reflux disease)    h/o peptic ulcer   High cholesterol    HSV-2 infection    anal   Nocturia    Obesity    Overactive bladder    Reflux    Urgency of urination    Past Surgical History:  Procedure Laterality Date   ARTHROSCOPY KNEE W/ DRILLING     X4 ON RT KNEE   CARPAL TUNNEL RELEASE Right 2000   colonscopy  2007, 2017   ESOPHAGOGASTRODUODENOSCOPY  2007   Dr. Ferdinand Lango, [place]   foot fibroma surgery  all before 1985   x 3   HEMORRHOID SURGERY  04/2006   HIP FRACTURE SURGERY Right    KNEE ARTHROSCOPY     X2 LEFT KNEE   SINUS EXPLORATION  1995   TOE AMPUTATION  1959   TOTAL KNEE ARTHROPLASTY Right 09/22/2012   Procedure: RIGHT TOTAL KNEE ARTHROPLASTY;  Surgeon: [name], MD;  Location: [facility];  Service: Orthopedics;  Laterality: Right;   TOTAL KNEE ARTHROPLASTY Left 09/28/2013   Procedure: LEFT TOTAL KNEE ARTHROPLASTY, CORTISONE INJECTION RIGHT HIP;  Surgeon: [name], MD;  Location: [facility];  Service: Orthopedics;   Laterality: Left;   Patient Active Problem List   Diagnosis Date Noted   Calculus of gallbladder without cholecystitis without obstruction 11/15/2020   RUQ pain 11/15/2020   Ataxia 11/15/2020   Chronic constipation 11/15/2020   Chronic insomnia 09/09/2020   Sleep disturbance 11/02/2019   Screening for diabetes mellitus 09/15/2019   Chronic low back pain 03/25/2019   Stress incontinence 11/07/2018   Need for influenza vaccination 09/08/2018   Vaccine counseling 09/08/2018   Family history of osteoporosis 09/08/2018   Estrogen deficiency 09/08/2018   Post-menopausal 09/08/2018   Encounter for health maintenance examination in adult 08/07/2018   Barrett's esophagus with dysplasia 03/07/2018   Insomnia 03/07/2018   Osteoarthritis of multiple joints 09/12/2012   HSV-2 infection    Hyperlipidemia    Overactive bladder    Obesity    Anxiety and depression    GERD (gastroesophageal reflux disease)     REFERRING DIAG: N32.81 (ICD-10-CM) - Overactive bladder  THERAPY DIAG:  Abnormal posture  Muscle weakness (generalized)  Unspecified lack of coordination  Rationale for Evaluation and Treatment Rehabilitation  PERTINENT HISTORY: Bil THA/TKA  PRECAUTIONS: NA  SUBJECTIVE: Pt states that she has had company and has not been  performing exercises as much as she would like to be. She has been trying to do exercises every other day. She still has not has any lower abdominal pain and reports have more consistent bowel movements. Pt states that she went to the bathroom before bed at 10 and then slept through the night. She also does not feel like she is dribbling anymore.   PAIN:  Are you having pain? No     SUBJECTIVE 06/22/21:                                                                                                                                                                                            SUBJECTIVE STATEMENT: Patient states that she is having trouble at night  dribbling after she goes to the bathroom. She will also go to the bathroom 5 times in the several hours prior to going to bed when she is just relaxing. She went for one pelvic floor PT visit years ago, but never returned. She is also in therapy for low back to help her spinal stenosis and L4/5. Fluid intake: Yes: 100oz of water a day; large diet drink during the day - no caffeine after 2 in the afternoon; occasional alcohol consumption on the weekends     Patient confirms identification and approves PT to assess pelvic floor and treatment Yes     PAIN:  Are you having pain? Yes NPRS scale: 5/10 Pain location: Lt lower quadrant pain; low back pain   Pain type: aching Pain description: intermittent    Aggravating factors: not sure Relieving factors: in PT for low back      PATIENT GOALS decrease dribbling at night, urinary frequency, urgency   BOWEL MOVEMENT Pain with bowel movement: No Type of bowel movement:Frequency every several days to 4x/day and Strain Yes Fully empty rectum: Yes: sometimes Leakage: No Pads: No Fiber supplement: Yes: metamucil Sometimes has to splint Sometimes will have urgency   URINATION Pain with urination: No Fully empty bladder: No Stream: Weak - has to strain to pee Urgency: Yes: - Frequency: Every 2/5-3 hours during the day, hardly at all in the morning, and then 5 times before bed; 2-3x/night Leakage: Urge to void Pads: No   INTERCOURSE Pain with intercourse:  None Ability to have vaginal penetration:  Yes: - Climax: yes, nonpainful Marinoff Scale: 0/3   PREGNANCY Vaginal deliveries 0 C-section deliveries 0     PROLAPSE Heaviness in lower abdomen, worse with legs crossed     OBJECTIVE 06/22/21:    COGNITION:            Overall cognitive status: Within functional  limits for tasks assessed                          SENSATION:            Light touch: Appears intact            Proprioception: Appears intact   GAIT:   Comments:  WNL   POSTURE:  Decreased lumbar lordosis, posterior pelvic tilt     PELVIC MMT: 2/5 strength, poor coordination - significant abdominal contribution 4 second endurance 4 repeat contractions         PALPATION:   General  WNL                 External Perineal Exam redness/dryness                             Internal Pelvic Floor superficial tenderness most likely associated with vaginal wall thinning   TONE: WNL   PROLAPSE: None seen today, but patient had significant difficulty with coordination of bearing down - will continue to attempt assessment of this due to subjective exam that aligns with some amount of anterior/posterior vaginal wall laxity   TODAY'S TREATMENT 08/03/21 Neuromuscular re-education:  Core facilitation: Supine march 2 x 10 Leg extensions 10x bil Seated horizontal abduction 2 x 10 green band Exercises: Strengthening: Bridge with hip adduction 2 x 10 Seated clam shells, 2 x 10 blue band Seated hip IR with hip adduction, red band, 2 x 10    TREATMENT 07/26/21 Neuromuscular re-education: Core retraining:  Transversus abdominus training with multimodal cues for improved motor control and breath coordination Core facilitation: Supine march 2 x 10 Leg extensions 10x bil Exercises: Strengthening: Bridge with hip adduction 10x Self-care: Not using soap vaginally Squatty potty/relaxed toileting mechanics Strict bladder retraining Self-bowel massage  TREATMENT 06/22/21 EVAL  Neuromuscular re-education: Pelvic floor contraction training: Quick flicks 2 x 10 with multimodal cues for improved coordination/motor control Long holds 5 x 10 seconds with multimodal cues for improved coordination/motor control Self-care: Urge suppression technique       PATIENT EDUCATION:  Education details: Exercise progressions Person educated: Patient Education method: Consulting civil engineer, Demonstration, Tactile cues, Verbal cues, and Handouts Education comprehension:  verbalized understanding     HOME EXERCISE PROGRAM: [id number]   ASSESSMENT:   CLINICAL IMPRESSION: Pt making excellent progress with no frequency leading up to bed, no nocturia, and minimal if any leaking thorughout the day. We reviewed previous core/pelvic floor progressions for good form and breath coordination today and provided addition of seated strengthening exercise to help her feel a stronger core/pelvic floor co-contraction. HEP updated. We discussed progress and importance of building more functional HEP with good variety to continue once she is done with PT. She will benefit from skilled PT intervention in order to decrease urinary urgency/incontinence, improve bladder habits, and improve QOL.      OBJECTIVE IMPAIRMENTS decreased coordination, decreased endurance, decreased mobility, decreased strength, increased muscle spasms, postural dysfunction, and pain.    ACTIVITY LIMITATIONS continence   PARTICIPATION LIMITATIONS: interpersonal relationship and community activity   PERSONAL FACTORS 1 comorbidity: Bil TKA/THA  are also affecting patient's functional outcome.    REHAB POTENTIAL: Good   CLINICAL DECISION MAKING: Stable/uncomplicated   EVALUATION COMPLEXITY: Low     GOALS: Goals reviewed with patient? Yes   SHORT TERM GOALS: Target date: 07/20/2021 - updated 07/26/21   Pt will be independent with HEP.  Baseline: Goal status: MET 07/26/21   2.  Pt will be independent with the knack, urge suppression technique, and double voiding in order to improve bladder habits and decrease urinary incontinence.    Baseline:  Goal status: MET 07/26/21   3.  Pt will be able to correctly perform diaphragmatic breathing and appropriate pressure management in order to prevent worsening vaginal wall laxity and improve pelvic floor A/ROM.    Baseline:  Goal status: MET 07/26/21       LONG TERM GOALS: Target date: 09/14/2021 - updated 07/26/21   Pt will be independent with  advanced HEP.    Baseline:  Goal status: IN PROGRESS   2.  Pt will demonstrate normal pelvic floor muscle tone and A/ROM, able to achieve 4/5 strength with contractions and 10 sec endurance, in order to provide appropriate lumbopelvic support in functional activities.    Baseline: Not formally assessed today Goal status: IN PROGRESS   3.  Patient will report no lower abdominal pain greater than 1/10.  Baseline:  Goal status: MET 07/26/21   4.  Pt will be able to go 2-3 hours in between voids without urgency or incontinence in order to improve QOL and perform all functional activities with less difficulty.    Baseline:  Goal status: MET 07/26/21   5.  Pt will decrease frequency of nightly trips to the bathroom to 1 or less in order to get restful sleep and only one trip to the bathroom before bed without leaking.      Baseline:  Goal status: MET 07/26/21       PLAN: PT FREQUENCY: 1x/week   PT DURATION: 12 weeks   PLANNED INTERVENTIONS: Therapeutic exercises, Therapeutic activity, Neuromuscular re-education, Balance training, Gait training, Patient/Family education, Joint mobilization, Dry Needling, Biofeedback, and Manual therapy   PLAN FOR NEXT SESSION: Progress functional strengthening.    Heather Roberts, PT, DPT07/20/234:17 PM

## 2021-08-04 ENCOUNTER — Other Ambulatory Visit: Payer: Self-pay | Admitting: Medical

## 2021-08-09 ENCOUNTER — Ambulatory Visit: Payer: BC Managed Care – PPO

## 2021-08-09 DIAGNOSIS — M6281 Muscle weakness (generalized): Secondary | ICD-10-CM | POA: Diagnosis not present

## 2021-08-09 DIAGNOSIS — R293 Abnormal posture: Secondary | ICD-10-CM

## 2021-08-09 DIAGNOSIS — R279 Unspecified lack of coordination: Secondary | ICD-10-CM | POA: Diagnosis not present

## 2021-08-09 NOTE — Therapy (Addendum)
OUTPATIENT PHYSICAL THERAPY TREATMENT NOTE   Patient Name: Kristi Avila MRN: [id number] DOB:02-19-1955, 66 y.o., female Today's Date: 08/09/2021  PCP: Carlena Hurl, PA-C REFERRING PROVIDER: Avon Gully, NP  END OF SESSION:   PT End of Session - 08/09/21 1448     Visit Number 4    Date for PT Re-Evaluation 09/14/21    Authorization Type BCBS    PT Start Time 1445    PT Stop Time 1525    PT Time Calculation (min) 40 min    Activity Tolerance Patient tolerated treatment well    Behavior During Therapy [facility] for tasks assessed/performed              Past Medical History:  Diagnosis Date   Arthritis    Barrett's esophagus with esophagitis 07/23/12   EGD: CLO test results negative. pathology: Barretss: Repeat EGD 3 years   Blood transfusion without reported diagnosis    remote past   Depression    Family history of anesthesia complication    mother trouble waking up   Frequency of urination    GERD (gastroesophageal reflux disease)    h/o peptic ulcer   High cholesterol    HSV-2 infection    anal   Nocturia    Obesity    Overactive bladder    Reflux    Urgency of urination    Past Surgical History:  Procedure Laterality Date   ARTHROSCOPY KNEE W/ DRILLING     X4 ON RT KNEE   CARPAL TUNNEL RELEASE Right 2000   colonscopy  2007, 2017   ESOPHAGOGASTRODUODENOSCOPY  2007   Dr. Ferdinand Lango, [place]   foot fibroma surgery  all before 1985   x 3   HEMORRHOID SURGERY  04/2006   HIP FRACTURE SURGERY Right    KNEE ARTHROSCOPY     X2 LEFT KNEE   SINUS EXPLORATION  1995   TOE AMPUTATION  1959   TOTAL KNEE ARTHROPLASTY Right 09/22/2012   Procedure: RIGHT TOTAL KNEE ARTHROPLASTY;  Surgeon: [name], MD;  Location: [facility];  Service: Orthopedics;  Laterality: Right;   TOTAL KNEE ARTHROPLASTY Left 09/28/2013   Procedure: LEFT TOTAL KNEE ARTHROPLASTY, CORTISONE INJECTION RIGHT HIP;  Surgeon: [name], MD;  Location: [facility];  Service: Orthopedics;   Laterality: Left;   Patient Active Problem List   Diagnosis Date Noted   Calculus of gallbladder without cholecystitis without obstruction 11/15/2020   RUQ pain 11/15/2020   Ataxia 11/15/2020   Chronic constipation 11/15/2020   Chronic insomnia 09/09/2020   Sleep disturbance 11/02/2019   Screening for diabetes mellitus 09/15/2019   Chronic low back pain 03/25/2019   Stress incontinence 11/07/2018   Need for influenza vaccination 09/08/2018   Vaccine counseling 09/08/2018   Family history of osteoporosis 09/08/2018   Estrogen deficiency 09/08/2018   Post-menopausal 09/08/2018   Encounter for health maintenance examination in adult 08/07/2018   Barrett's esophagus with dysplasia 03/07/2018   Insomnia 03/07/2018   Osteoarthritis of multiple joints 09/12/2012   HSV-2 infection    Hyperlipidemia    Overactive bladder    Obesity    Anxiety and depression    GERD (gastroesophageal reflux disease)     REFERRING DIAG: N32.81 (ICD-10-CM) - Overactive bladder  THERAPY DIAG:  Abnormal posture  Muscle weakness (generalized)  Unspecified lack of coordination  Rationale for Evaluation and Treatment Rehabilitation  PERTINENT HISTORY: Bil THA/TKA  PRECAUTIONS: NA  SUBJECTIVE: Pt states that she has had company and has not been  performing exercises as much as she would like to be. She has been trying to do exercises every other day. She still has not has any lower abdominal pain and reports have more consistent bowel movements. Pt states that she went to the bathroom before bed at 10 and then slept through the night. She also does not feel like she is dribbling anymore.   PAIN:  Are you having pain? No     SUBJECTIVE 06/22/21:                                                                                                                                                                                            SUBJECTIVE STATEMENT: Patient states that she is having trouble at night  dribbling after she goes to the bathroom. She will also go to the bathroom 5 times in the several hours prior to going to bed when she is just relaxing. She went for one pelvic floor PT visit years ago, but never returned. She is also in therapy for low back to help her spinal stenosis and L4/5. Fluid intake: Yes: 100oz of water a day; large diet drink during the day - no caffeine after 2 in the afternoon; occasional alcohol consumption on the weekends     Patient confirms identification and approves PT to assess pelvic floor and treatment Yes     PAIN:  Are you having pain? Yes NPRS scale: 5/10 Pain location: Lt lower quadrant pain; low back pain   Pain type: aching Pain description: intermittent    Aggravating factors: not sure Relieving factors: in PT for low back      PATIENT GOALS decrease dribbling at night, urinary frequency, urgency   BOWEL MOVEMENT Pain with bowel movement: No Type of bowel movement:Frequency every several days to 4x/day and Strain Yes Fully empty rectum: Yes: sometimes Leakage: No Pads: No Fiber supplement: Yes: metamucil Sometimes has to splint Sometimes will have urgency   URINATION Pain with urination: No Fully empty bladder: No Stream: Weak - has to strain to pee Urgency: Yes: - Frequency: Every 2/5-3 hours during the day, hardly at all in the morning, and then 5 times before bed; 2-3x/night Leakage: Urge to void Pads: No   INTERCOURSE Pain with intercourse:  None Ability to have vaginal penetration:  Yes: - Climax: yes, nonpainful Marinoff Scale: 0/3   PREGNANCY Vaginal deliveries 0 C-section deliveries 0     PROLAPSE Heaviness in lower abdomen, worse with legs crossed     OBJECTIVE 06/22/21:    COGNITION:            Overall cognitive status: Within functional  limits for tasks assessed                          SENSATION:            Light touch: Appears intact            Proprioception: Appears intact   GAIT:   Comments:  WNL   POSTURE:  Decreased lumbar lordosis, posterior pelvic tilt     PELVIC MMT: 2/5 strength, poor coordination - significant abdominal contribution 4 second endurance 4 repeat contractions         PALPATION:   General  WNL                 External Perineal Exam redness/dryness                             Internal Pelvic Floor superficial tenderness most likely associated with vaginal wall thinning   TONE: WNL   PROLAPSE: None seen today, but patient had significant difficulty with coordination of bearing down - will continue to attempt assessment of this due to subjective exam that aligns with some amount of anterior/posterior vaginal wall laxity   TODAY'S TREATMENT 08/09/21 Neuromuscular re-education: Core facilitation: Leg extensions 10x bil Standing bil UE extension, 3 x 10, red band Standing march with anterior Bil UE hold 3 x 10 Exercises: Stretches/mobility: Lower trunk rotation 2 x 10 Strengthening: Strengthening: Bridge with hip adduction 2 x 10 Seated clam shells, 2 x 10 blue band Seated hip IR with hip adduction, red band, 2 x 10 Squats 10x   TREATMENT 08/03/21 Neuromuscular re-education:  Core facilitation: Supine march 2 x 10 Leg extensions 10x bil Seated horizontal abduction 2 x 10 green band Exercises: Strengthening: Bridge with hip adduction 2 x 10 Seated clam shells, 2 x 10 blue band Seated hip IR with hip adduction, red band, 2 x 10    TREATMENT 07/26/21 Neuromuscular re-education: Core retraining:  Transversus abdominus training with multimodal cues for improved motor control and breath coordination Core facilitation: Supine march 2 x 10 Leg extensions 10x bil Exercises: Strengthening: Bridge with hip adduction 10x Self-care: Not using soap vaginally Squatty potty/relaxed toileting mechanics Strict bladder retraining Self-bowel massage        PATIENT EDUCATION:  Education details: Exercise progressions Person educated:  Patient Education method: Consulting civil engineer, Demonstration, Corporate treasurer cues, Verbal cues, and Handouts Education comprehension: verbalized understanding     HOME EXERCISE PROGRAM: [email or web address]   ASSESSMENT:   CLINICAL IMPRESSION: Pt continues to make great progress and is on track for D/C next treatment session. Due to not being able to perform many exercises over the last week, many reviewed for form and comfort this treatment session. She required some minor corrections to core facilitation exercises in order to make sure she had good proprioception of core/pelvic floor, but much less cuing required than at last treatment session. She tolerated progressions to standing exercises very well with good core control. She will benefit from skilled PT intervention in order to decrease urinary urgency/incontinence, improve bladder habits, and improve QOL.      OBJECTIVE IMPAIRMENTS decreased coordination, decreased endurance, decreased mobility, decreased strength, increased muscle spasms, postural dysfunction, and pain.    ACTIVITY LIMITATIONS continence   PARTICIPATION LIMITATIONS: interpersonal relationship and community activity   PERSONAL FACTORS 1 comorbidity: Bil TKA/THA  are also affecting patient's functional outcome.    REHAB POTENTIAL: Good   CLINICAL DECISION  MAKING: Stable/uncomplicated   EVALUATION COMPLEXITY: Low     GOALS: Goals reviewed with patient? Yes   SHORT TERM GOALS: Target date: 07/20/2021 - updated 07/26/21   Pt will be independent with HEP.    Baseline: Goal status: MET 07/26/21   2.  Pt will be independent with the knack, urge suppression technique, and double voiding in order to improve bladder habits and decrease urinary incontinence.    Baseline:  Goal status: MET 07/26/21   3.  Pt will be able to correctly perform diaphragmatic breathing and appropriate pressure management in order to prevent worsening vaginal wall laxity and improve pelvic floor A/ROM.     Baseline:  Goal status: MET 07/26/21       LONG TERM GOALS: Target date: 09/14/2021 - updated 07/26/21   Pt will be independent with advanced HEP.    Baseline:  Goal status: IN PROGRESS   2.  Pt will demonstrate normal pelvic floor muscle tone and A/ROM, able to achieve 4/5 strength with contractions and 10 sec endurance, in order to provide appropriate lumbopelvic support in functional activities.    Baseline: Not formally assessed today Goal status: IN PROGRESS   3.  Patient will report no lower abdominal pain greater than 1/10.  Baseline:  Goal status: MET 07/26/21   4.  Pt will be able to go 2-3 hours in between voids without urgency or incontinence in order to improve QOL and perform all functional activities with less difficulty.    Baseline:  Goal status: MET 07/26/21   5.  Pt will decrease frequency of nightly trips to the bathroom to 1 or less in order to get restful sleep and only one trip to the bathroom before bed without leaking.      Baseline:  Goal status: MET 07/26/21       PLAN: PT FREQUENCY: 1x/week   PT DURATION: 12 weeks   PLANNED INTERVENTIONS: Therapeutic exercises, Therapeutic activity, Neuromuscular re-education, Balance training, Gait training, Patient/Family education, Joint mobilization, Dry Needling, Biofeedback, and Manual therapy   PLAN FOR NEXT SESSION: Progress functional strengthening.    Heather Roberts, PT, DPT07/26/233:25 PM  PHYSICAL THERAPY DISCHARGE SUMMARY  Visits from Start of Care: 4  Current functional level related to goals / functional outcomes: Complete   Remaining deficits: See above   Education / Equipment: HEP   Patient agrees to discharge. Patient goals were met. Patient is being discharged due to not returning since the last visit.   Heather Roberts, PT, DPT12/26/2310:16 AM

## 2021-08-16 ENCOUNTER — Ambulatory Visit: Payer: BC Managed Care – PPO

## 2021-08-18 DIAGNOSIS — R3915 Urgency of urination: Secondary | ICD-10-CM | POA: Diagnosis not present

## 2021-08-18 DIAGNOSIS — N898 Other specified noninflammatory disorders of vagina: Secondary | ICD-10-CM | POA: Diagnosis not present

## 2021-08-18 DIAGNOSIS — B3731 Acute candidiasis of vulva and vagina: Secondary | ICD-10-CM | POA: Diagnosis not present

## 2021-08-28 ENCOUNTER — Other Ambulatory Visit: Payer: Self-pay | Admitting: Medical

## 2021-08-30 ENCOUNTER — Other Ambulatory Visit: Payer: Self-pay | Admitting: Medical

## 2021-09-02 ENCOUNTER — Telehealth: Payer: Self-pay | Admitting: Medical

## 2021-09-02 NOTE — Telephone Encounter (Signed)
Fax received Dexlant not covered, pt already has P.A. approval til 01/05/22

## 2021-09-06 ENCOUNTER — Telehealth: Payer: Self-pay | Admitting: Medical

## 2021-09-06 MED ORDER — DEXLANSOPRAZOLE 60 MG PO CPDR: 60.0000 mg | DELAYED_RELEASE_CAPSULE | Freq: Every day | ORAL | 2 refills | Status: DC

## 2021-09-06 NOTE — Telephone Encounter (Signed)
Called [employer] about P.A. Dexilant approval and was informed that generic Dexlansoprazole is covered,  called pt and she is ok with trying the generic.  Called pharmacy and Rx states dispense as written so they couldn't change.  Escribed as generic.

## 2021-09-07 NOTE — Telephone Encounter (Signed)
Plan pays for generic

## 2021-09-12 ENCOUNTER — Telehealth: Payer: Self-pay | Admitting: Medical

## 2021-09-12 NOTE — Telephone Encounter (Signed)
Pt left message that generic Dexlansoprazole not working, thought was going to have to go to ER because of heartburn was so bad.  Asked that I pursue the P.A. for brand Dexilant.

## 2021-09-12 NOTE — Telephone Encounter (Signed)
Called [employer] T# [phone]) regarding issues with approval til end of year then all of a sudden not covered.  States was plan change and needs another P.A. they are faxing forms for urgent review.

## 2021-09-13 ENCOUNTER — Other Ambulatory Visit: Payer: Self-pay | Admitting: Medical

## 2021-09-13 MED ORDER — PANTOPRAZOLE SODIUM 40 MG PO TBEC: 40.0000 mg | DELAYED_RELEASE_TABLET | Freq: Every day | ORAL | 1 refills | Status: DC

## 2021-09-14 ENCOUNTER — Other Ambulatory Visit: Payer: Self-pay | Admitting: Medical

## 2021-09-15 ENCOUNTER — Telehealth: Payer: Self-pay | Admitting: Medical

## 2021-09-15 NOTE — Telephone Encounter (Signed)
Patient reports that while using the [place] she experienced tightness in her chest, burning esophagus, reflux, felt light heart attack, thought she was going to have to go to hospital.  Very painful, then she gets panicky because she doesn't know if it is a heart attack or not.  States she has had to put blocks under her bed to even sleep now due to the pain in her chest since having to switch to the generic dexlansoprazole.  She has been seeing seeing GI specialists for years for her GERD and Barrett's esophagus.  She has been on every OTC and generic medication available including, Famotidine, Omeprazole, Pantoprazole, Ranitidine, Zegerid, Reglan, & now Dexlansoprazole.  The only thing that helped control her symptoms and gave her any relief was Brand Dexilant.

## 2021-09-15 NOTE — Telephone Encounter (Signed)
P.A. done for Protonix

## 2021-09-20 ENCOUNTER — Encounter: Payer: Self-pay | Admitting: Internal Medicine

## 2021-09-20 NOTE — Telephone Encounter (Signed)
Denied, Did [place] provider courtesy review for brand Dexilant

## 2021-09-20 NOTE — Telephone Encounter (Signed)
P.A. PROTONIX/ PANTOPRAZOLE done & approved, sent pt mychart message

## 2021-09-23 NOTE — Telephone Encounter (Signed)
Appeal brand Dexilant approved, called pharmacy & rejected refill too soon because pt picked up generic 8/23.  Pharmacist called insurance company & they unable to over ride, said patient has to call.  Left message for pt to call insurance company to explain & try for over ride.

## 2021-09-26 NOTE — Telephone Encounter (Signed)
Called pt and she finally was able to get the brand Dexilant at the pharmacy

## 2021-09-27 DIAGNOSIS — M7062 Trochanteric bursitis, left hip: Secondary | ICD-10-CM | POA: Diagnosis not present

## 2021-09-27 DIAGNOSIS — H04123 Dry eye syndrome of bilateral lacrimal glands: Secondary | ICD-10-CM | POA: Diagnosis not present

## 2021-10-05 DIAGNOSIS — L570 Actinic keratosis: Secondary | ICD-10-CM | POA: Diagnosis not present

## 2021-10-05 DIAGNOSIS — C44729 Squamous cell carcinoma of skin of left lower limb, including hip: Secondary | ICD-10-CM | POA: Diagnosis not present

## 2021-10-16 ENCOUNTER — Other Ambulatory Visit: Payer: Self-pay | Admitting: Medical

## 2021-10-20 ENCOUNTER — Telehealth: Payer: Self-pay | Admitting: Licensed Clinical Social Worker

## 2021-10-20 NOTE — Patient Instructions (Signed)
Visit Information  Thank you for taking time to visit with me today. Please don't hesitate to contact me if I can be of assistance to you.   Following are the goals we discussed today:   Goals Addressed             This Visit's Progress    COMPLETED: Care Coordination Activities-No Follow Up Needed       Care Coordination Interventions: Active listening / Reflection utilized  Pt agreed to contact PCP to address arthritis and possible referral to specialist  LCSW reviewed upcoming appts LCSW informed patient of care coordination services. Pt is not interested at this time and agreed to contact PCP, should needs arise         If you are experiencing a Mental Health or [place] or need someone to talk to, please call the Suicide and Crisis Lifeline: 988 call 911   Patient verbalizes understanding of instructions and care plan provided today and agrees to view in [place]. Active MyChart status and patient understanding of how to access instructions and care plan via MyChart confirmed with patient.     No further follow up required:    Christa See, MSW, [email or web address] Phone [phone] 5:08 PM

## 2021-10-20 NOTE — Patient Outreach (Signed)
  Care Coordination   Initial Visit Note   10/20/2021 Name: Kristi Avila MRN: [id number] DOB: Jan 10, 1956  Kristi Avila is a 66 y.o. year old female who sees Tysinger, Camelia Eng, PA-C for primary care. I spoke with  Kristi Avila by phone today.  What matters to the patients health and wellness today?  Care Coordination    Goals Addressed             This Visit's Progress    COMPLETED: Care Coordination Activities-No Follow Up Needed       Care Coordination Interventions: Active listening / Reflection utilized  Pt agreed to contact PCP to address arthritis and possible referral to specialist  LCSW reviewed upcoming appts LCSW informed patient of care coordination services. Pt is not interested at this time and agreed to contact PCP, should needs arise          SDOH assessments and interventions completed:  No     Care Coordination Interventions Activated:  Yes  Care Coordination Interventions:  Yes, provided   Follow up plan: No further intervention required.   Encounter Outcome:  Pt. Refused   Christa See, MSW, [email or web address] Phone [phone] 5:07 PM

## 2021-10-23 ENCOUNTER — Telehealth: Payer: Self-pay

## 2021-10-23 DIAGNOSIS — H0279 Other degenerative disorders of eyelid and periocular area: Secondary | ICD-10-CM | POA: Diagnosis not present

## 2021-10-23 DIAGNOSIS — H02831 Dermatochalasis of right upper eyelid: Secondary | ICD-10-CM | POA: Diagnosis not present

## 2021-10-23 DIAGNOSIS — H57813 Brow ptosis, bilateral: Secondary | ICD-10-CM | POA: Diagnosis not present

## 2021-10-23 DIAGNOSIS — H02834 Dermatochalasis of left upper eyelid: Secondary | ICD-10-CM | POA: Diagnosis not present

## 2021-10-23 DIAGNOSIS — D485 Neoplasm of uncertain behavior of skin: Secondary | ICD-10-CM | POA: Diagnosis not present

## 2021-10-23 NOTE — Telephone Encounter (Signed)
Pt. Called to schedule her CPE and I got her scheduled on 11/23/21 for that. She also wanted to get her labs done for that CPE on 11/21/21 if you could put the orders in for her labs.

## 2021-10-24 ENCOUNTER — Encounter: Payer: Self-pay | Admitting: Internal Medicine

## 2021-10-25 ENCOUNTER — Other Ambulatory Visit: Payer: Self-pay | Admitting: Medical

## 2021-10-25 DIAGNOSIS — E785 Hyperlipidemia, unspecified: Secondary | ICD-10-CM

## 2021-10-25 DIAGNOSIS — E559 Vitamin D deficiency, unspecified: Secondary | ICD-10-CM | POA: Insufficient documentation

## 2021-10-25 DIAGNOSIS — Z1389 Encounter for screening for other disorder: Secondary | ICD-10-CM | POA: Insufficient documentation

## 2021-10-25 DIAGNOSIS — Z0000 Encounter for general adult medical examination without abnormal findings: Secondary | ICD-10-CM

## 2021-10-25 DIAGNOSIS — H04123 Dry eye syndrome of bilateral lacrimal glands: Secondary | ICD-10-CM | POA: Diagnosis not present

## 2021-10-30 ENCOUNTER — Other Ambulatory Visit: Payer: Self-pay | Admitting: Medical

## 2021-11-07 ENCOUNTER — Other Ambulatory Visit: Payer: Self-pay | Admitting: Medical

## 2021-11-08 ENCOUNTER — Other Ambulatory Visit: Payer: Self-pay | Admitting: Medical

## 2021-11-08 ENCOUNTER — Encounter: Payer: Self-pay | Admitting: Nurse Practitioner

## 2021-11-08 ENCOUNTER — Ambulatory Visit (INDEPENDENT_AMBULATORY_CARE_PROVIDER_SITE_OTHER): Payer: BC Managed Care – PPO | Admitting: Nurse Practitioner

## 2021-11-08 VITALS — BP 102/70 | HR 75 | Ht 66.5 in | Wt 214.2 lb

## 2021-11-08 DIAGNOSIS — K219 Gastro-esophageal reflux disease without esophagitis: Secondary | ICD-10-CM

## 2021-11-08 MED ORDER — FAMOTIDINE 40 MG PO TABS: ORAL_TABLET | ORAL | 5 refills | Status: DC

## 2021-11-08 NOTE — Progress Notes (Signed)
Attending Physician's Attestation   I have reviewed the chart.   I agree with the Advanced Practitioner's note, impression, and recommendations with any updates as below.    Kathlyne Loud Mansouraty, MD [facility] Office # [phone]  

## 2021-11-08 NOTE — Progress Notes (Signed)
Chief Complaint:  acid reflux   Assessment &  Plan   #66 year old female with chronic GERD. Over the years she has failed several PPIs. Generally does well on Dexilant plus pepcid 20 mg at night.  Recently developed breakthrough symptoms taking pantoprazole while awaiting insurance approval for Dexilant.   Slowly improving back on Dexilant and also with a temporary increase in night time famotidine from 20 mg to 40 mg.  Discussed anti-reflux measures such as avoidance of late meals / bedtime snacks, HOB elevation (or use of wedge pillow), weight reduction ( if applicable)  / maintaining a healthy BMI ( body mass index),  and avoidance of trigger foods and caffeine.  Continue Dexilant. She will continue to higher dose of famotidine at bedtime for now. When reflux symptoms have resolved she will reduce dose back to 20 mg at bedtime.  Barrett's esophagus listed in PMH. Supposedly diagnosed in 2014. Unable to find EGD / biopsies but no Barrett's on March 2020 EGD.   #Colon cancer screening.  Normal screening colonoscopy 2017.  She was recommended to have repeat colonoscopy at 10-year interval   HPI   Patient is a 66 year old female known to Dr. Ardis Hughs.  She has a past medical history significant for chronic constipation, chronic GERD , obesity, hyperlipidemia . See PMH /PSH for additional history   Interval History:  Kristi Avila is having reflux symptoms with some burning in chest and epigastric discomfort [employer] gives her a hard time about paying for Dexilant. In August she had to take Protonix while PCP was getting Dexilant approved. During this time she had a flare in GERD symptoms but had also started a fish oil capsule during this time. Things were slow to improve after restarting Dexilant which has now been approved for a year. Symptoms finally have started to improve. She is taking the fish oil capsule at night which helps.   She isn't really limiting caffeine intake. Doesn't elevate HOH but  she does go to bed on an empty stomach.   Previous GI Evaluation   March 2020 EGD - Gastritis. Biopsied. - The examination was otherwise norma  Past Medical History:  Diagnosis Date   Arthritis    Barrett's esophagus with esophagitis 07/23/12   EGD: CLO test results negative. pathology: Barretss: Repeat EGD 3 years   Blood transfusion without reported diagnosis    remote past   Depression    Family history of anesthesia complication    mother trouble waking up   Frequency of urination    GERD (gastroesophageal reflux disease)    h/o peptic ulcer   High cholesterol    HSV-2 infection    anal   Nocturia    Obesity    Overactive bladder    Reflux    Urgency of urination     Past Surgical History:  Procedure Laterality Date   ARTHROSCOPY KNEE W/ DRILLING     X4 ON RT KNEE   CARPAL TUNNEL RELEASE Right 2000   colonscopy  2007, 2017   ESOPHAGOGASTRODUODENOSCOPY  2007   Dr. Ferdinand Lango, [place]   foot fibroma surgery  all before 1985   x 3   HEMORRHOID SURGERY  04/2006   HIP FRACTURE SURGERY Right 2018   KNEE ARTHROSCOPY     X2 LEFT KNEE   LIPOMA EXCISION  11/2020   SINUS EXPLORATION  1995   TOE AMPUTATION  1959   TOTAL HIP ARTHROPLASTY Left 2022   TOTAL KNEE ARTHROPLASTY Right 09/22/2012   Procedure:  RIGHT TOTAL KNEE ARTHROPLASTY;  Surgeon: [name], MD;  Location: [facility];  Service: Orthopedics;  Laterality: Right;   TOTAL KNEE ARTHROPLASTY Left 09/28/2013   Procedure: LEFT TOTAL KNEE ARTHROPLASTY, CORTISONE INJECTION RIGHT HIP;  Surgeon: [name], MD;  Location: [facility];  Service: Orthopedics;  Laterality: Left;    Current Medications, Allergies, Family History and Social History were reviewed in [employer] record.     Current Outpatient Medications  Medication Sig Dispense Refill   dexlansoprazole (DEXILANT) 60 MG capsule Take 60 mg by mouth daily.     DULoxetine (CYMBALTA) 60 MG capsule TAKE 1 CAPSULE BY MOUTH TWICE DAILY  180 capsule 0   ezetimibe (ZETIA) 10 MG tablet TAKE 1 TABLET(10 MG) BY MOUTH DAILY 90 tablet 0   famotidine (PEPCID) 20 MG tablet Take 1 tablet (20 mg total) by mouth at bedtime. (Patient taking differently: Take 40 mg by mouth at bedtime.) 30 tablet 5   Magnesium 400 MG TABS Take 1 capsule by mouth daily in the afternoon.     meloxicam (MOBIC) 15 MG tablet TAKE 1 TABLET(15 MG) BY MOUTH DAILY 90 tablet 0   MULTIPLE VITAMIN PO Take 1 tablet by mouth daily.     Plecanatide (TRULANCE) 3 MG TABS Take 1 tablet by mouth daily. 90 tablet 3   Probiotic Product (PROBIOTIC PO) Take 1 tablet by mouth daily in the afternoon.     psyllium (METAMUCIL) 58.6 % powder Take by mouth. 2 teaspoons nightly     rosuvastatin (CRESTOR) 5 MG tablet TAKE 1 TABLET(5 MG) BY MOUTH EVERY OTHER DAY 30 tablet 0   valACYclovir (VALTREX) 500 MG tablet TAKE 1 TABLET(500 MG) BY MOUTH DAILY 90 tablet 1   ZINC-VITAMIN C PO Take 1 tablet by mouth daily in the afternoon.     No current facility-administered medications for this visit.    Review of Systems: No chest pain. No shortness of breath. No urinary complaints.    Physical Exam  Wt Readings from Last 3 Encounters:  11/08/21 214 lb 4 oz (97.2 kg)  04/20/21 215 lb (97.5 kg)  11/30/20 214 lb 6.4 oz (97.3 kg)    BP 102/70   Pulse 75   Ht 5' 6.5" (1.689 m)   Wt 214 lb 4 oz (97.2 kg)   LMP 12/31/2019   BMI 34.06 kg/m  Constitutional:  Generally well appearing female in no acute distress. Psychiatric: Pleasant. Normal mood and affect. Behavior is normal. EENT: Pupils normal.  Conjunctivae are normal. No scleral icterus. Neck supple.  Cardiovascular: Normal rate, regular rhythm. No edema Pulmonary/chest: Effort normal and breath sounds normal. No wheezing, rales or rhonchi. Abdominal: Soft, nondistended, nontender. Bowel sounds active throughout. There are no masses palpable. No hepatomegaly. Neurological: Alert and oriented to person place and time. Skin: Skin is  warm and dry. No rashes noted.  Tye Savoy, NP  11/08/2021, 3:24 PM

## 2021-11-08 NOTE — Patient Instructions (Addendum)
For now, will increase Pepcid to 40 mg at bedtime. When acid reflux symptoms have resolved then reduce to 20 mg at bedtime. Follow up in one year, sooner if needed    Acid Reflux  Below are some measures you can take to possibly improve acid reflux symptoms . We may have discussed some of these today in the office. Not everything on this list may apply to you   --If you are taking anti-reflux ( GERD) medication be sure to take it 30 minutes before breakfast and if taking twice daily then also second dose should be 30 minutes before dinner.   --Avoid late meals / bedtime snacks.   --Avoid trigger foods ( foods which you know tend to aggravate you reflux symptoms). Some common trigger foods include spicy foods, fatty foods, acidic foods, chocolate and caffeine.  --Elevate the head of bed 6-8 inches on blocks or bricks. If not able to elevate the head of the bed consider purchasing a wedge pillow to sleep on.    --Weight reduction / maintain a healthy BMI ( body mass index) may be help with reflux symptoms  --Sometimes with the above mentioned "lifestyle changes" patients are able to reduce the amount of GERD medications they take. Our goal is to have you on the lowest effective dose of medication  _______________________________________________________  If you are age 16 or older, your body mass index should be between 23-30. Your Body mass index is 34.06 kg/m. If this is out of the aforementioned range listed, please consider follow up with your Primary Care Provider.  If you are age 52 or younger, your body mass index should be between 19-25. Your Body mass index is 34.06 kg/m. If this is out of the aformentioned range listed, please consider follow up with your Primary Care Provider.   ________________________________________________________  The [facility] providers would like to encourage you to use [facility] to communicate with providers for non-urgent requests or questions.  Due to long  hold times on the telephone, sending your provider a message by [facility] may be a faster and more efficient way to get a response.  Please allow 48 business hours for a response.  Please remember that this is for non-urgent requests.  _______________________________________________________

## 2021-11-14 DIAGNOSIS — L570 Actinic keratosis: Secondary | ICD-10-CM | POA: Diagnosis not present

## 2021-11-14 DIAGNOSIS — C44729 Squamous cell carcinoma of skin of left lower limb, including hip: Secondary | ICD-10-CM | POA: Diagnosis not present

## 2021-11-17 DIAGNOSIS — L988 Other specified disorders of the skin and subcutaneous tissue: Secondary | ICD-10-CM | POA: Diagnosis not present

## 2021-11-17 DIAGNOSIS — D485 Neoplasm of uncertain behavior of skin: Secondary | ICD-10-CM | POA: Diagnosis not present

## 2021-11-20 ENCOUNTER — Other Ambulatory Visit: Payer: Self-pay | Admitting: Medical

## 2021-11-21 ENCOUNTER — Other Ambulatory Visit: Payer: BC Managed Care – PPO

## 2021-11-21 DIAGNOSIS — Z0000 Encounter for general adult medical examination without abnormal findings: Secondary | ICD-10-CM

## 2021-11-21 DIAGNOSIS — E559 Vitamin D deficiency, unspecified: Secondary | ICD-10-CM

## 2021-11-21 DIAGNOSIS — E785 Hyperlipidemia, unspecified: Secondary | ICD-10-CM

## 2021-11-21 DIAGNOSIS — Z6834 Body mass index (BMI) 34.0-34.9, adult: Secondary | ICD-10-CM | POA: Diagnosis not present

## 2021-11-21 DIAGNOSIS — Z1389 Encounter for screening for other disorder: Secondary | ICD-10-CM | POA: Diagnosis not present

## 2021-11-21 DIAGNOSIS — M4310 Spondylolisthesis, site unspecified: Secondary | ICD-10-CM | POA: Diagnosis not present

## 2021-11-22 DIAGNOSIS — H04123 Dry eye syndrome of bilateral lacrimal glands: Secondary | ICD-10-CM | POA: Diagnosis not present

## 2021-11-22 LAB — COMPREHENSIVE METABOLIC PANEL
ALT: 16 IU/L (ref 0–32)
AST: 18 IU/L (ref 0–40)
Albumin/Globulin Ratio: 1.9 (ref 1.2–2.2)
Albumin: 4.6 g/dL (ref 3.9–4.9)
Alkaline Phosphatase: 88 IU/L (ref 44–121)
BUN/Creatinine Ratio: 18 (ref 12–28)
BUN: 16 mg/dL (ref 8–27)
Bilirubin Total: 0.3 mg/dL (ref 0.0–1.2)
CO2: 22 mmol/L (ref 20–29)
Calcium: 9.4 mg/dL (ref 8.7–10.3)
Chloride: 103 mmol/L (ref 96–106)
Creatinine, Ser: 0.88 mg/dL (ref 0.57–1.00)
Globulin, Total: 2.4 g/dL (ref 1.5–4.5)
Glucose: 102 mg/dL — ABNORMAL HIGH (ref 70–99)
Potassium: 4.8 mmol/L (ref 3.5–5.2)
Sodium: 144 mmol/L (ref 134–144)
Total Protein: 7 g/dL (ref 6.0–8.5)
eGFR: 73 mL/min/{1.73_m2} (ref >59)

## 2021-11-22 LAB — LIPID PANEL
Chol/HDL Ratio: 2.3 ratio (ref 0.0–4.4)
Cholesterol, Total: 157 mg/dL (ref 100–199)
HDL: 67 mg/dL (ref >39)
LDL Chol Calc (NIH): 74 mg/dL (ref 0–99)
Triglycerides: 87 mg/dL (ref 0–149)
VLDL Cholesterol Cal: 16 mg/dL (ref 5–40)

## 2021-11-22 LAB — URINALYSIS
Bilirubin, UA: NEGATIVE
Glucose, UA: NEGATIVE
Leukocytes,UA: NEGATIVE
Nitrite, UA: NEGATIVE
RBC, UA: NEGATIVE
Specific Gravity, UA: 1.026 (ref 1.005–1.030)
Urobilinogen, Ur: 0.2 mg/dL (ref 0.2–1.0)
pH, UA: 6.5 (ref 5.0–7.5)

## 2021-11-22 LAB — CBC
Hematocrit: 39.1 % (ref 34.0–46.6)
Hemoglobin: 12.2 g/dL (ref 11.1–15.9)
MCH: 28 pg (ref 26.6–33.0)
MCHC: 31.2 g/dL — ABNORMAL LOW (ref 31.5–35.7)
MCV: 90 fL (ref 79–97)
Platelets: 384 10*3/uL (ref 150–450)
RBC: 4.35 x10E6/uL (ref 3.77–5.28)
RDW: 13.2 % (ref 11.7–15.4)
WBC: 6.1 10*3/uL (ref 3.4–10.8)

## 2021-11-22 LAB — VITAMIN D 25 HYDROXY (VIT D DEFICIENCY, FRACTURES): Vit D, 25-Hydroxy: 58.6 ng/mL (ref 30.0–100.0)

## 2021-11-23 ENCOUNTER — Other Ambulatory Visit: Payer: Self-pay | Admitting: Medical

## 2021-11-23 ENCOUNTER — Ambulatory Visit (INDEPENDENT_AMBULATORY_CARE_PROVIDER_SITE_OTHER): Payer: BC Managed Care – PPO | Admitting: Medical

## 2021-11-23 VITALS — BP 110/70 | HR 63 | Ht 66.0 in | Wt 211.2 lb

## 2021-11-23 DIAGNOSIS — M19042 Primary osteoarthritis, left hand: Secondary | ICD-10-CM

## 2021-11-23 DIAGNOSIS — N3281 Overactive bladder: Secondary | ICD-10-CM

## 2021-11-23 DIAGNOSIS — E2839 Other primary ovarian failure: Secondary | ICD-10-CM

## 2021-11-23 DIAGNOSIS — B009 Herpesviral infection, unspecified: Secondary | ICD-10-CM

## 2021-11-23 DIAGNOSIS — F32A Depression, unspecified: Secondary | ICD-10-CM

## 2021-11-23 DIAGNOSIS — E559 Vitamin D deficiency, unspecified: Secondary | ICD-10-CM

## 2021-11-23 DIAGNOSIS — E785 Hyperlipidemia, unspecified: Secondary | ICD-10-CM

## 2021-11-23 DIAGNOSIS — R7301 Impaired fasting glucose: Secondary | ICD-10-CM

## 2021-11-23 DIAGNOSIS — K219 Gastro-esophageal reflux disease without esophagitis: Secondary | ICD-10-CM

## 2021-11-23 DIAGNOSIS — F5104 Psychophysiologic insomnia: Secondary | ICD-10-CM

## 2021-11-23 DIAGNOSIS — K5909 Other constipation: Secondary | ICD-10-CM | POA: Diagnosis not present

## 2021-11-23 DIAGNOSIS — Z7185 Encounter for immunization safety counseling: Secondary | ICD-10-CM

## 2021-11-23 DIAGNOSIS — M545 Low back pain, unspecified: Secondary | ICD-10-CM

## 2021-11-23 DIAGNOSIS — H9201 Otalgia, right ear: Secondary | ICD-10-CM

## 2021-11-23 DIAGNOSIS — K22719 Barrett's esophagus with dysplasia, unspecified: Secondary | ICD-10-CM

## 2021-11-23 DIAGNOSIS — Z780 Asymptomatic menopausal state: Secondary | ICD-10-CM

## 2021-11-23 DIAGNOSIS — Z0000 Encounter for general adult medical examination without abnormal findings: Secondary | ICD-10-CM

## 2021-11-23 DIAGNOSIS — M19041 Primary osteoarthritis, right hand: Secondary | ICD-10-CM | POA: Diagnosis not present

## 2021-11-23 DIAGNOSIS — Z8262 Family history of osteoporosis: Secondary | ICD-10-CM

## 2021-11-23 DIAGNOSIS — Z131 Encounter for screening for diabetes mellitus: Secondary | ICD-10-CM

## 2021-11-23 DIAGNOSIS — M159 Polyosteoarthritis, unspecified: Secondary | ICD-10-CM

## 2021-11-23 DIAGNOSIS — F419 Anxiety disorder, unspecified: Secondary | ICD-10-CM

## 2021-11-23 DIAGNOSIS — C4490 Unspecified malignant neoplasm of skin, unspecified: Secondary | ICD-10-CM | POA: Diagnosis not present

## 2021-11-23 DIAGNOSIS — M150 Primary generalized (osteo)arthritis: Secondary | ICD-10-CM

## 2021-11-23 DIAGNOSIS — G8929 Other chronic pain: Secondary | ICD-10-CM

## 2021-11-23 LAB — HEMOGLOBIN A1C
Est. average glucose Bld gHb Est-mCnc: 108 mg/dL
Hgb A1c MFr Bld: 5.4 % (ref 4.8–5.6)

## 2021-11-23 MED ORDER — SUCRALFATE 1 G PO TABS: 1.0000 g | ORAL_TABLET | Freq: Three times a day (TID) | ORAL | 1 refills | Status: DC

## 2021-11-23 MED ORDER — DULOXETINE HCL 60 MG PO CPEP: 60.0000 mg | ORAL_CAPSULE | Freq: Two times a day (BID) | ORAL | 0 refills | Status: DC

## 2021-11-23 MED ORDER — VALACYCLOVIR HCL 500 MG PO TABS: 500.0000 mg | ORAL_TABLET | Freq: Every day | ORAL | 1 refills | Status: DC

## 2021-11-23 MED ORDER — TRULANCE 3 MG PO TABS: 1.0000 | ORAL_TABLET | Freq: Every day | ORAL | 3 refills | Status: DC

## 2021-11-23 MED ORDER — EZETIMIBE 10 MG PO TABS: ORAL_TABLET | ORAL | 3 refills | Status: DC

## 2021-11-23 MED ORDER — ESOMEPRAZOLE MAGNESIUM 40 MG PO CPDR: 40.0000 mg | DELAYED_RELEASE_CAPSULE | Freq: Every day | ORAL | 3 refills | Status: DC

## 2021-11-23 MED ORDER — ROSUVASTATIN CALCIUM 5 MG PO TABS: ORAL_TABLET | ORAL | 1 refills | Status: DC

## 2021-11-23 NOTE — Progress Notes (Signed)
Subjective:   HPI  Kristi Avila is a 66 y.o. female who presents for Chief Complaint  Patient presents with   nonfasting cpe    Nonfasting cpe, had labs done the other day. Arthritis in hands, spots on legs, discuss dexilants    Patient Care Team: Crissa Sowder, Camelia Eng, PA-C as PCP - General (Family Medicine) Brien Few, MD as Consulting Physician (Obstetrics and Gynecology) Danella Sensing, MD as Consulting Physician (Dermatology) Chales Salmon, [place] (Optometry) Solon Augusta, MD as Attending Physician (Ophthalmology) Gaynelle Arabian, MD as Consulting Physician (Orthopedic Surgery) Earnie Larsson, MD as Consulting Physician (Neurosurgery) Willia Craze, NP as Nurse Practitioner (Gastroenterology) Sees dentist Sees eye doctor  Concerns: Here for physical today.  She came in prior to this visit for fasting labs.  Here to discuss labs.  She has ongoing chronic problems with acid reflux and history of ulcer.  She sees gastroenterology.  In December her insurance will no longer cover Dexilant which she relies on.  Protonix and omeprazole did not work for her.  She recently in addition to Imbler has added higher dose 40 mg of Pepcid.  She wants advice on other remedies once her insurance quit paying for Dexilant.  She had her COVID-vaccine at the pharmacy recently  She continues to have new squamous cell cancers on her skin of her legs.  Sees Dr. Wilhemina Bonito with dermatology.  She has some ear pressure thinks she may have an ear infection on the right.  She started taking some amoxicillin she had leftover.  No other URI symptoms.  She has arthritis in her fingers and wants an orthopedic referral to discuss some of her fingers that are bent that causing problems with her day-to-day functioning.  She is compliant with her other medicines as usual.  Reviewed their medical, surgical, family, social, medication, and allergy history and updated chart as appropriate.  Past  Medical History:  Diagnosis Date   Arthritis    Barrett's esophagus with esophagitis 07/23/12   EGD: CLO test results negative. pathology: Barretss: Repeat EGD 3 years   Blood transfusion without reported diagnosis    remote past   Depression    Family history of anesthesia complication    mother trouble waking up   Frequency of urination    GERD (gastroesophageal reflux disease)    h/o peptic ulcer   High cholesterol    HSV-2 infection    anal   Nocturia    Obesity    Overactive bladder    Reflux    Urgency of urination     Family History  Problem Relation Age of Onset   Arthritis Mother    Pulmonary fibrosis Father        pulmonary fibrosis   Pulmonary disease Father    Cancer Sister        pancreatic   Diabetes Brother    Colon cancer Neg Hx    Esophageal cancer Neg Hx    Stomach cancer Neg Hx      Current Outpatient Medications:    dexlansoprazole (DEXILANT) 60 MG capsule, Take 60 mg by mouth daily., Disp: , Rfl:    esomeprazole (NEXIUM) 40 MG capsule, Take 1 capsule (40 mg total) by mouth daily., Disp: 30 capsule, Rfl: 3   famotidine (PEPCID) 40 MG tablet, One tablet at bedtime, Disp: 30 tablet, Rfl: 5   Magnesium 400 MG TABS, Take 1 capsule by mouth daily in the afternoon., Disp: , Rfl:    meloxicam (MOBIC) 15 MG  tablet, TAKE 1 TABLET(15 MG) BY MOUTH DAILY, Disp: 90 tablet, Rfl: 0   MULTIPLE VITAMIN PO, Take 1 tablet by mouth daily., Disp: , Rfl:    Polyethyl Glycol-Propyl Glycol (SYSTANE FREE OP), Apply to eye., Disp: , Rfl:    Probiotic Product (PROBIOTIC PO), Take 1 tablet by mouth daily in the afternoon., Disp: , Rfl:    psyllium (METAMUCIL) 58.6 % powder, Take by mouth. 2 teaspoons nightly, Disp: , Rfl:    sucralfate (CARAFATE) 1 g tablet, Take 1 tablet (1 g total) by mouth 4 (four) times daily -  with meals and at bedtime., Disp: 90 tablet, Rfl: 1   ZINC-VITAMIN C PO, Take 1 tablet by mouth daily in the afternoon., Disp: , Rfl:    DULoxetine (CYMBALTA) 60  MG capsule, Take 1 capsule (60 mg total) by mouth 2 (two) times daily., Disp: 180 capsule, Rfl: 0   ezetimibe (ZETIA) 10 MG tablet, TAKE 1 TABLET(10 MG) BY MOUTH DAILY, Disp: 90 tablet, Rfl: 3   Plecanatide (TRULANCE) 3 MG TABS, Take 1 tablet by mouth daily., Disp: 90 tablet, Rfl: 3   RESTASIS 0.05 % ophthalmic emulsion, Place 1 drop into both eyes 2 (two) times daily., Disp: , Rfl:    rosuvastatin (CRESTOR) 5 MG tablet, TAKE 1 TABLET(5 MG) BY MOUTH EVERY OTHER DAY, Disp: 90 tablet, Rfl: 1   valACYclovir (VALTREX) 500 MG tablet, Take 1 tablet (500 mg total) by mouth daily., Disp: 90 tablet, Rfl: 1  Allergies  Allergen Reactions   Cephalosporins Diarrhea   Codeine Itching   Latex Itching   Pravastatin     Myalgias   Simvastatin     myalgias   Sulfa Antibiotics Diarrhea   Past Surgical History:  Procedure Laterality Date   ARTHROSCOPY KNEE W/ DRILLING     X4 ON RT KNEE   CARPAL TUNNEL RELEASE Right 2000   colonscopy  2007, 2017   ESOPHAGOGASTRODUODENOSCOPY  2007   Dr. Ferdinand Lango, [place]   foot fibroma surgery  all before 1985   x 3   HEMORRHOID SURGERY  04/2006   HIP FRACTURE SURGERY Right 2018   KNEE ARTHROSCOPY     X2 LEFT KNEE   LIPOMA EXCISION  11/2020   SINUS EXPLORATION  1995   TOE AMPUTATION  1959   TOTAL HIP ARTHROPLASTY Left 2022   TOTAL KNEE ARTHROPLASTY Right 09/22/2012   Procedure: RIGHT TOTAL KNEE ARTHROPLASTY;  Surgeon: [name], MD;  Location: [facility];  Service: Orthopedics;  Laterality: Right;   TOTAL KNEE ARTHROPLASTY Left 09/28/2013   Procedure: LEFT TOTAL KNEE ARTHROPLASTY, CORTISONE INJECTION RIGHT HIP;  Surgeon: [name], MD;  Location: [facility];  Service: Orthopedics;  Laterality: Left;     Review of Systems  Constitutional:  Negative for chills, fever, malaise/fatigue and weight loss.  HENT:  Negative for congestion, ear pain, hearing loss, sore throat and tinnitus.   Eyes:  Negative for blurred vision, pain and redness.   Respiratory:  Negative for cough, hemoptysis and shortness of breath.   Cardiovascular:  Negative for chest pain, palpitations, orthopnea, claudication and leg swelling.  Gastrointestinal:  Positive for constipation and heartburn. Negative for abdominal pain, blood in stool, diarrhea, nausea and vomiting.  Genitourinary:  Negative for dysuria, flank pain, frequency, hematuria and urgency.  Musculoskeletal:  Positive for joint pain and myalgias. Negative for falls.  Skin:  Negative for itching and rash.  Neurological:  Negative for dizziness, tingling, speech change, weakness and headaches.  Endo/Heme/Allergies:  Negative  for polydipsia. Does not bruise/bleed easily.  Psychiatric/Behavioral:  Negative for depression and memory loss. The patient has insomnia. The patient is not nervous/anxious.          11/23/2021   12:10 PM 11/15/2020    8:31 AM 09/08/2020   12:47 PM 05/04/2020    2:03 PM 11/02/2019    3:08 PM  Depression screen PHQ 2/9  Decreased Interest 0 0 0 0 0  Down, Depressed, Hopeless 0 0 0 0 0  PHQ - 2 Score 0 0 0 0 0  Altered sleeping     0  Tired, decreased energy     0  Change in appetite     0  Feeling bad or failure about yourself      0  Trouble concentrating     0  Moving slowly or fidgety/restless     0  Suicidal thoughts     0  PHQ-9 Score     0       Objective:  BP 110/70   Pulse 63   Ht '5\' 6"'$  (1.676 m)   Wt 211 lb 3.2 oz (95.8 kg)   LMP 12/31/2019   BMI 34.09 kg/m   General appearance: alert, no distress, WD/WN, Caucasian female, dressed with shoes on Skin: Fair skin in general, several small roundish erythematous flat crusted lesions on lower legs and recent biopsy scars on lower legs HEENT: normocephalic, conjunctiva/corneas normal, sclerae anicteric, PERRLA, EOMi, nares patent, no discharge or erythema, pharynx normal Oral cavity: MMM, tongue normal, teeth in good repair Neck: supple, no lymphadenopathy, no thyromegaly, no masses, normal ROM, no  bruits Chest: non tender, normal shape and expansion Heart: RRR, normal S1, S2, no murmurs Lungs: CTA bilaterally, no wheezes, rhonchi, or rales Abdomen: +bs, soft, non tender, non distended, no masses, no hepatomegaly, no splenomegaly, no bruits Back: non tender, normal ROM, no scoliosis Musculoskeletal: Bony arthritic changes of fingers throughout of the PIPs and DIPs, upper extremities non tender, no obvious deformity, normal ROM throughout, lower extremities non tender, no obvious deformity, normal ROM throughout Extremities: no edema, no cyanosis, no clubbing Pulses: 2+ symmetric, upper and lower extremities, normal cap refill Neurological: alert, oriented x 3, CN2-12 intact, strength normal upper extremities and lower extremities, sensation normal throughout, DTRs 2+ throughout, no cerebellar signs, gait normal Psychiatric: normal affect, behavior normal, pleasant  Breast/gyn/rectal - deferred to gynecology    Assessment and Plan :   Encounter Diagnoses  Name Primary?   Encounter for health maintenance examination in adult Yes   Impaired fasting blood sugar    Osteoarthritis of both hands, unspecified osteoarthritis type    Barrett's esophagus with dysplasia    Chronic constipation    Gastroesophageal reflux disease, unspecified whether esophagitis present    Primary osteoarthritis involving multiple joints    Overactive bladder    Anxiety and depression    Chronic insomnia    Chronic low back pain, unspecified back pain laterality, unspecified whether sciatica present    Vitamin D deficiency    Vaccine counseling    Screening for diabetes mellitus    Post-menopausal    Hyperlipidemia, unspecified hyperlipidemia type    HSV-2 infection    Family history of osteoporosis    Estrogen deficiency    Right ear pain    Skin cancer      This visit was a preventative care visit, also known as wellness visit or routine physical.   Topics typically include healthy lifestyle,  diet, exercise, preventative care, vaccinations,  sick and well care, proper use of emergency dept and after hours care, as well as other concerns.     Recommendations: Continue to return yearly for your annual wellness and preventative care visits.  This gives [place] a chance to discuss healthy lifestyle, exercise, vaccinations, review your chart record, and perform screenings where appropriate.  I recommend you see your eye doctor yearly for routine vision care.  I recommend you see your dentist yearly for routine dental care including hygiene visits twice yearly.  See your gynecologist yearly for routine gynecological care.   Vaccination recommendations were reviewed Immunization History  Administered Date(s) Administered   COVID-19, mRNA, vaccine(Comirnaty)12 years and older 10/03/2021   Influenza Inj Mdck Quad Pf 09/18/2017   Influenza Split 10/03/2021   Influenza Whole 10/28/2012   Influenza,inj,Quad PF,6+ Mos 10/12/2015, 10/10/2016, 09/08/2018   Influenza,inj,quad, With Preservative 10/15/2016   Influenza-Unspecified 11/29/2013, 10/26/2014, 10/15/2019, 10/13/2020   PFIZER Comirnaty(Gray Top)Covid-19 Tri-Sucrose Vaccine 05/04/2020   PFIZER(Purple Top)SARS-COV-2 Vaccination 02/23/2019, 03/20/2019, 10/15/2019   PNEUMOCOCCAL CONJUGATE-20 01/10/2021   Rsv, Bivalent, Protein Subunit Rsvpref,pf Evans Lance) 08/30/2021   Tdap 06/04/2011, 10/23/2019, 09/06/2021   Zoster Recombinat (Shingrix) 11/28/2018, 01/28/2019   Zoster, Live 11/29/2013    We will get a copy of her recent COVID-vaccine document from pharmacy  Screening for cancer: Colon cancer screening: I reviewed your colonoscopy on file that is up to date from 2017  We will request a copy of her last Pap smear and mammogram from gynecology.  She will sign release today.   Skin cancer screening: Check your skin regularly for new changes, growing lesions, or other lesions of concern Come in for evaluation if you have skin lesions  of concern.  Lung cancer screening: If you have a greater than 20 pack year history of tobacco use, then you may qualify for lung cancer screening with a chest CT scan.   Please call your insurance company to inquire about coverage for this test.  We currently don't have screenings for other cancers besides breast, cervical, colon, and lung cancers.  If you have a strong family history of cancer or have other cancer screening concerns, please let me know.    Bone health: Get at least 150 minutes of aerobic exercise weekly Get weight bearing exercise at least once weekly Bone density test:  A bone density test is an imaging test that uses a type of X-ray to measure the amount of calcium and other minerals in your bones. The test may be used to diagnose or screen you for a condition that causes weak or thin bones (osteoporosis), predict your risk for a broken bone (fracture), or determine how well your osteoporosis treatment is working. The bone density test is recommended for females 11 and older, or females or males <19 if certain risk factors such as thyroid disease, long term use of steroids such as for asthma or rheumatological issues, vitamin D deficiency, estrogen deficiency, family history of osteoporosis, self or family history of fragility fracture in first degree relative.  I reviewed her bone density test from December 2022 showing osteopenia.   Heart health: Get at least 150 minutes of aerobic exercise weekly Limit alcohol It is important to maintain a healthy blood pressure and healthy cholesterol numbers  Heart disease screening: Screening for heart disease includes screening for blood pressure, fasting lipids, glucose/diabetes screening, BMI height to weight ratio, reviewed of smoking status, physical activity, and diet.    Goals include blood pressure 120/80 or less, maintaining a healthy lipid/cholesterol profile,  preventing diabetes or keeping diabetes numbers under good  control, not smoking or using tobacco products, exercising most days per week or at least 150 minutes per week of exercise, and eating healthy variety of fruits and vegetables, healthy oils, and avoiding unhealthy food choices like fried food, fast food, high sugar and high cholesterol foods.    Other tests may possibly include EKG test, CT coronary calcium score, echocardiogram, exercise treadmill stress test.    Medical care options: I recommend you continue to seek care here first for routine care.  We try really hard to have available appointments Monday through Friday daytime hours for sick visits, acute visits, and physicals.  Urgent care should be used for after hours and weekends for significant issues that cannot wait till the next day.  The emergency department should be used for significant potentially life-threatening emergencies.  The emergency department is expensive, can often have long wait times for less significant concerns, so try to utilize primary care, urgent care, or telemedicine when possible to avoid unnecessary trips to the emergency department.  Virtual visits and telemedicine have been introduced since the pandemic started in 2020, and can be convenient ways to receive medical care.  We offer virtual appointments as well to assist you in a variety of options to seek medical care.   Advanced Directives: I recommend you consider completing a [place] and Living Will.   These documents respect your wishes and help alleviate burdens on your loved ones if you were to become terminally ill or be in a position to need those documents enforced.    You can complete Advanced Directives yourself, have them notarized, then have copies made for our office, for you and for anybody you feel should have them in safe keeping.  Or, you can have an attorney prepare these documents.   If you haven't updated your Last Will and Testament in a while, it may be worthwhile  having an attorney prepare these documents together and save on some costs.       Separate significant issues discussed: Osteoarthritis-we discussed topical cream she can use such as capsaicin cream or Aspercreme for comfort, or Tylenol for pain, but use her meloxicam as a third tier medication due to long-term risk of this medication.  Referral to orthopedics for further discussion  GERD, history of Barrett's esophagus-advised continued follow-up with gastroenterology.  For now continue Dexilant but we will send Nexium in case this is available or covered by insurance when she  changes insurance soon.  Begin sucralfate since she is not getting enough relief with current regimen.  Continue avoid GERD triggers  Dyslipidemia-recent labs at goal, continue Zetia and Crestor  History of HSV-continue Valtrex as needed  Chronic constipation-doing fine on Trulance, continue good fiber and water intake  Right ear pain without infection-advise she stop amoxicillin and use over-the-counter Mucinex for the next several days  History of anxiety and depression-doing fine on Cymbalta 60 mg twice daily   [place] was seen today for nonfasting cpe.  Diagnoses and all orders for this visit:  Encounter for health maintenance examination in adult -     Hemoglobin A1c  Impaired fasting blood sugar -     Hemoglobin A1c  Osteoarthritis of both hands, unspecified osteoarthritis type -     AMB referral to orthopedics  Barrett's esophagus with dysplasia  Chronic constipation  Gastroesophageal reflux disease, unspecified whether esophagitis present  Primary osteoarthritis involving multiple joints  Overactive bladder  Anxiety  and depression  Chronic insomnia  Chronic low back pain, unspecified back pain laterality, unspecified whether sciatica present  Vitamin D deficiency  Vaccine counseling  Screening for diabetes mellitus  Post-menopausal  Hyperlipidemia, unspecified hyperlipidemia  type  HSV-2 infection  Family history of osteoporosis  Estrogen deficiency  Right ear pain  Skin cancer  Other orders -     sucralfate (CARAFATE) 1 g tablet; Take 1 tablet (1 g total) by mouth 4 (four) times daily -  with meals and at bedtime. -     esomeprazole (NEXIUM) 40 MG capsule; Take 1 capsule (40 mg total) by mouth daily. -     DULoxetine (CYMBALTA) 60 MG capsule; Take 1 capsule (60 mg total) by mouth 2 (two) times daily. -     ezetimibe (ZETIA) 10 MG tablet; TAKE 1 TABLET(10 MG) BY MOUTH DAILY -     Plecanatide (TRULANCE) 3 MG TABS; Take 1 tablet by mouth daily. -     rosuvastatin (CRESTOR) 5 MG tablet; TAKE 1 TABLET(5 MG) BY MOUTH EVERY OTHER DAY -     valACYclovir (VALTREX) 500 MG tablet; Take 1 tablet (500 mg total) by mouth daily.    Follow-up pending labs, yearly for physical

## 2021-11-28 DIAGNOSIS — M7062 Trochanteric bursitis, left hip: Secondary | ICD-10-CM | POA: Diagnosis not present

## 2021-11-28 DIAGNOSIS — Z96642 Presence of left artificial hip joint: Secondary | ICD-10-CM | POA: Diagnosis not present

## 2021-11-30 DIAGNOSIS — M7062 Trochanteric bursitis, left hip: Secondary | ICD-10-CM | POA: Diagnosis not present

## 2021-12-04 DIAGNOSIS — M7062 Trochanteric bursitis, left hip: Secondary | ICD-10-CM | POA: Diagnosis not present

## 2021-12-15 ENCOUNTER — Encounter: Payer: Self-pay | Admitting: Nurse Practitioner

## 2021-12-15 ENCOUNTER — Ambulatory Visit (INDEPENDENT_AMBULATORY_CARE_PROVIDER_SITE_OTHER): Payer: Medicare Other | Admitting: Nurse Practitioner

## 2021-12-15 VITALS — BP 118/72 | HR 74 | Ht 66.5 in | Wt 213.4 lb

## 2021-12-15 DIAGNOSIS — K219 Gastro-esophageal reflux disease without esophagitis: Secondary | ICD-10-CM

## 2021-12-15 DIAGNOSIS — R194 Change in bowel habit: Secondary | ICD-10-CM

## 2021-12-15 NOTE — Patient Instructions (Addendum)
Try to refrain from taking imodium. Underlying bowel pattern seems likely to be constipation.   Take metamucil daily.   Continue Trulance daily.   Avoid artificial sweeteners, they can cause diarrhea.   On days that you do not have a BM then use either a glycerin or dulcolax suppository.   Discontinue probiotics since there hasn't been any benefit from them  Will schedule you for an upper endoscopy.

## 2021-12-15 NOTE — Progress Notes (Signed)
Assessment    Patient profile:  Kristi Avila is a 66 y.o. female known to Dr. Ardis Hughs with a past medical history of GERD, ? Barrett's esophagus, chronic constipation, hyperlipidemia. See PMH /PSH for additional history  # Chronic GERD ( ? History of Barrett's). Having breakthrough regurgitation and pyrosis on Dexilant, 40 mg of pepcid at night, Tums as needed and Carafate.   # Altered bowel habits. Started with diarrhea on a cruise several months ago.  Currently she may go a few days without a BM and then has loose BMs for a day during which time she takes imodium. Could have post-infectious IBS but more suspicious that this is all related to constipation  Plan   Try to refrain from taking imodium.  Take metamucil daily.  Continue daily Trulance. Can hold Carafate since it doesn't seem to be offering much relief and can be constipating. If heartburn gets worse off carafate then can resume it.   On days that you do not have a BM then use either a glycerin or dulcolax suppository.  Discontinue probiotics since there hasn't been any benefit from them Avoid artificial sweeteners. Schedule for EGD for evaluation of refractory GERD symptoms and also to evaluate history of Barrett's esophagus. The risks and benefits of EGD with possible biopsies were discussed with the patient who agrees to proceed.  If EGD is normal and symptoms persist then may need 24-hour pH study Continue Dexilant in am and Pepcid 40 mg at night   HPI    Chief complaint:  GERD follow up   Kristi Avila was seen her 11/08/21 for GERD, please see that note for details.   She is having breakthrough GERD symptoms .She has elevated the HOB, stopped fish oil, limited caffeine, avoids spicy foods. She is taking Dexilant in am and 40 mg of pepcid at bedtime. Despite these measures she continues to have regurgitation and burning in chest. Tums help a little.  PCP started her on Carafate which seems to help a little. Additionally  she is avoiding spicy / tomato based foods.  She hasn't gained any weight. She cannot understand why she is having breakthrough symptoms.   She gives a history of Barrett's esophagus diagnosed in 2014, records not available.   Unrelated to above, Kristi Avila went on a cruise several months ago. Developed loose stool on cruise and bowel movements have never returned to normal. Has either loose stools or is constipated. Describes no bowel movements for a few days and then has diarrhea for a day for which she takes imodium. Has been taking probiotics for months without improvement.  Taking trulance and metamucil. Also taking Mg+ daily.  She is lactose intolerant and uses lactaid and drinks almond milk. She thinks some of her beverages contain artifical sweeteners.   Previous GI Evaluation   Normal screening colonoscopy 2017.  She was recommended to have repeat colonoscopy at 10-year interval    Labs:     Latest Ref Rng & Units 11/21/2021    8:18 AM 04/25/2020    4:54 PM 12/31/2019    5:47 PM  CBC  WBC 3.4 - 10.8 x10E3/uL 6.1  7.3  8.9   Hemoglobin 11.1 - 15.9 g/dL 12.2  12.5  12.7   Hematocrit 34.0 - 46.6 % 39.1  38.0  39.4   Platelets 150 - 450 x10E3/uL 384  344  338        Latest Ref Rng & Units 11/21/2021    8:18 AM 11/15/2020  9:15 AM 04/25/2020    4:54 PM  Hepatic Function  Total Protein 6.0 - 8.5 g/dL 7.0  6.8  6.7   Albumin 3.9 - 4.9 g/dL 4.6  4.7  4.4   AST 0 - 40 IU/L _0 ALT 0 - 32 IU/L _1 Alk Phosphatase 44 - 121 IU/L 88  107  82   Total Bilirubin 0.0 - 1.2 mg/dL 0.3  0.3  <0.2      Past Medical History:  Diagnosis Date   Arthritis    Barrett's esophagus with esophagitis 07/23/12   EGD: CLO test results negative. pathology: Barretss: Repeat EGD 3 years   Blood transfusion without reported diagnosis    remote past   Depression    Family history of anesthesia complication    mother trouble waking up   Frequency of urination    GERD (gastroesophageal  reflux disease)    h/o peptic ulcer   High cholesterol    HSV-2 infection    anal   Nocturia    Obesity    Overactive bladder    Reflux    Urgency of urination     Past Surgical History:  Procedure Laterality Date   ARTHROSCOPY KNEE W/ DRILLING     X4 ON RT KNEE   CARPAL TUNNEL RELEASE Right 2000   colonscopy  2007, 2017   ESOPHAGOGASTRODUODENOSCOPY  2007   Dr. Ferdinand Lango, [place]   foot fibroma surgery  all before 1985   x 3   HEMORRHOID SURGERY  04/2006   HIP FRACTURE SURGERY Right 2018   KNEE ARTHROSCOPY     X2 LEFT KNEE   LIPOMA EXCISION  11/2020   SINUS EXPLORATION  1995   TOE AMPUTATION  1959   TOTAL HIP ARTHROPLASTY Left 2022   TOTAL KNEE ARTHROPLASTY Right 09/22/2012   Procedure: RIGHT TOTAL KNEE ARTHROPLASTY;  Surgeon: [name], MD;  Location: [facility];  Service: Orthopedics;  Laterality: Right;   TOTAL KNEE ARTHROPLASTY Left 09/28/2013   Procedure: LEFT TOTAL KNEE ARTHROPLASTY, CORTISONE INJECTION RIGHT HIP;  Surgeon: [name], MD;  Location: [facility];  Service: Orthopedics;  Laterality: Left;    Current Medications, Allergies, Family History and Social History were reviewed in [employer] record.     Current Outpatient Medications  Medication Sig Dispense Refill   dexlansoprazole (DEXILANT) 60 MG capsule Take 60 mg by mouth daily.     DULoxetine (CYMBALTA) 60 MG capsule Take 1 capsule (60 mg total) by mouth 2 (two) times daily. 180 capsule 0   esomeprazole (NEXIUM) 40 MG capsule Take 1 capsule (40 mg total) by mouth daily. 30 capsule 3   ezetimibe (ZETIA) 10 MG tablet TAKE 1 TABLET(10 MG) BY MOUTH DAILY 90 tablet 3   famotidine (PEPCID) 40 MG tablet One tablet at bedtime 30 tablet 5   Magnesium 400 MG TABS Take 1 capsule by mouth daily in the afternoon.     meloxicam (MOBIC) 15 MG tablet TAKE 1 TABLET(15 MG) BY MOUTH DAILY 90 tablet 0   MULTIPLE VITAMIN PO Take 1 tablet by mouth daily.     Plecanatide (TRULANCE) 3 MG  TABS Take 1 tablet by mouth daily. 90 tablet 3   Polyethyl Glycol-Propyl Glycol (SYSTANE FREE OP) Apply to eye.     Probiotic Product (PROBIOTIC PO) Take 1 tablet by mouth daily in the afternoon.     psyllium (METAMUCIL) 58.6 % powder Take by  mouth. 2 teaspoons nightly     RESTASIS 0.05 % ophthalmic emulsion Place 1 drop into both eyes 2 (two) times daily.     rosuvastatin (CRESTOR) 5 MG tablet TAKE 1 TABLET(5 MG) BY MOUTH EVERY OTHER DAY 90 tablet 1   sucralfate (CARAFATE) 1 g tablet TAKE 1 TABLET(1 GRAM) BY MOUTH FOUR TIMES DAILY AT BEDTIME WITH MEALS 270 tablet 0   valACYclovir (VALTREX) 500 MG tablet Take 1 tablet (500 mg total) by mouth daily. 90 tablet 1   ZINC-VITAMIN C PO Take 1 tablet by mouth daily in the afternoon.     No current facility-administered medications for this visit.    Review of Systems: No chest pain. No shortness of breath. No urinary complaints.    Physical Exam  Wt Readings from Last 3 Encounters:  12/15/21 213 lb 6 oz (96.8 kg)  11/23/21 211 lb 3.2 oz (95.8 kg)  11/08/21 214 lb 4 oz (97.2 kg)    BP 118/72   Pulse 74   Ht 5' 6.5" (1.689 m)   Wt 213 lb 6 oz (96.8 kg)   LMP 12/31/2019   BMI 33.92 kg/m  Constitutional:  Generally well appearing female in no acute distress. Psychiatric: Pleasant. Normal mood and affect. Behavior is normal. EENT: Pupils normal.  Conjunctivae are normal. No scleral icterus. Neck supple.  Cardiovascular: Normal rate, regular rhythm. No edema Pulmonary/chest: Effort normal and breath sounds normal. No wheezing, rales or rhonchi. Abdominal: Soft, nondistended, nontender. Bowel sounds active throughout. There are no masses palpable. No hepatomegaly. Neurological: Alert and oriented to person place and time. Skin: Skin is warm and dry. No rashes noted.  Tye Savoy, NP  12/15/2021, 2:05 PM

## 2021-12-18 NOTE — Progress Notes (Signed)
Attending Physician's Attestation   I have reviewed the chart.   I agree with the Advanced Practitioner's note, impression, and recommendations with any updates as below.    Jacia Sickman Mansouraty, MD [facility] Office # [phone]  

## 2021-12-25 ENCOUNTER — Other Ambulatory Visit: Payer: Self-pay | Admitting: Medical

## 2021-12-26 ENCOUNTER — Other Ambulatory Visit: Payer: Self-pay

## 2021-12-26 MED ORDER — DEXLANSOPRAZOLE 60 MG PO CPDR: DELAYED_RELEASE_CAPSULE | ORAL | 1 refills | Status: DC

## 2021-12-26 NOTE — Telephone Encounter (Signed)
Please get patient on the schedule for follow-up to discuss bone density scan and mammogram.    She had called in requesting mammogram recently but I thought this was screening.  Apparently she was having breast pain.  Is she is seeing gynecologist or not?  We did not do a breast and pelvic exam at her recent physical as I was under the impression she sees gynecology  I do not have an up-to-date Pap smear on file looking back in the chart

## 2021-12-26 NOTE — Telephone Encounter (Signed)
Please get her on the schedule for a visit to discuss her recent mammogram and bone density test.  We do not have an up-to-date Pap smear on file for her either  When I saw her for physical recently we did not do a breast or pelvic exam as I was in the impression she was seeing gynecology  She had called in recently about a mammogram but I thought this was a screening.  Apparently she had breast tenderness prompting the scan which generally would require a visit

## 2021-12-27 ENCOUNTER — Encounter: Payer: Self-pay | Admitting: *Deleted

## 2021-12-28 ENCOUNTER — Telehealth: Payer: Self-pay | Admitting: Medical

## 2021-12-28 NOTE — Telephone Encounter (Signed)
Help at hand is a Patient Assistance program so you can refer any of these messages like this to me & yes I have received the forms that I had asked her to send in.  Thanks

## 2021-12-28 NOTE — Telephone Encounter (Signed)
Pt Assistance Dexilant application completed & faxed

## 2022-01-02 ENCOUNTER — Encounter: Payer: Self-pay | Admitting: Medical

## 2022-01-05 ENCOUNTER — Other Ambulatory Visit: Payer: Self-pay | Admitting: Medical

## 2022-01-05 ENCOUNTER — Ambulatory Visit: Payer: Medicare Other | Admitting: Podiatry

## 2022-01-05 DIAGNOSIS — L853 Xerosis cutis: Secondary | ICD-10-CM

## 2022-01-05 DIAGNOSIS — M79674 Pain in right toe(s): Secondary | ICD-10-CM | POA: Diagnosis not present

## 2022-01-05 DIAGNOSIS — M79675 Pain in left toe(s): Secondary | ICD-10-CM | POA: Diagnosis not present

## 2022-01-05 DIAGNOSIS — B351 Tinea unguium: Secondary | ICD-10-CM

## 2022-01-05 MED ORDER — AMMONIUM LACTATE 12 % EX LOTN: 1.0000 | TOPICAL_LOTION | CUTANEOUS | 0 refills | Status: DC | PRN

## 2022-01-05 NOTE — Progress Notes (Signed)
  Subjective:  Patient ID: Kristi Avila, female    DOB: 17-Aug-1955,  MRN: [id number]  Chief Complaint  Patient presents with   Nail Problem    Nail trim    66 y.o. female returns for the above complaint.  Patient presents with thickened elongated dystrophic toenails x 10 mild pain on palpation hurts with ambulation hurts with pressure.  She would like for her to have the debrided down she has not seen and was prior to seeing me for this.  She has secondary complaint dry skin to both of her feet.  She is failed over-the-counter medication would like to discuss prescription options.  Objective:  There were no vitals filed for this visit. Podiatric Exam: Vascular: dorsalis pedis and posterior tibial pulses are palpable bilateral. Capillary return is immediate. Temperature gradient is WNL. Skin turgor WNL  Sensorium: Normal Semmes Weinstein monofilament test. Normal tactile sensation bilaterally. Nail Exam: Pt has thick disfigured discolored nails with subungual debris noted bilateral entire nail hallux through fifth toenails.  Pain on palpation to the nails. Ulcer Exam: There is no evidence of ulcer or pre-ulcerative changes or infection. Orthopedic Exam: Muscle tone and strength are WNL. No limitations in general ROM. No crepitus or effusions noted.  Skin: No Porokeratosis. No infection or ulcers.  Severe dryness noted to both feet.  Some fissuring cracking noted.  No open wounds or lesion noted    Assessment & Plan:   1. Xerosis of skin   2. Pain due to onychomycosis of toenails of both feet     Patient was evaluated and treated and all questions answered.  Xerosis skin I explained to the patient the etiology of xerosis and various treatment options were extensively discussed.  I explained to the patient the importance of maintaining moisturization of the skin with application of over-the-counter lotion such as Eucerin or Luciderm.  Clinically at this time patient will benefit from  ammonium lactate advised her to apply twice a day she states understanding   Onychomycosis with pain  -Nails palliatively debrided as below. -Educated on self-care  Procedure: Nail Debridement Rationale: pain  Type of Debridement: manual, sharp debridement. Instrumentation: Nail nipper, rotary burr. Number of Nails: 10  Procedures and Treatment: Consent by patient was obtained for treatment procedures. The patient understood the discussion of treatment and procedures well. All questions were answered thoroughly reviewed. Debridement of mycotic and hypertrophic toenails, 1 through 5 bilateral and clearing of subungual debris. No ulceration, no infection noted.  Return Visit-Office Procedure: Patient instructed to return to the office for a follow up visit 3 months for continued evaluation and treatment.  Boneta Lucks, DPM    No follow-ups on file.

## 2022-01-10 ENCOUNTER — Ambulatory Visit: Payer: Medicare Other | Admitting: Podiatry

## 2022-01-18 DIAGNOSIS — M25552 Pain in left hip: Secondary | ICD-10-CM | POA: Diagnosis not present

## 2022-01-18 DIAGNOSIS — Z96642 Presence of left artificial hip joint: Secondary | ICD-10-CM | POA: Diagnosis not present

## 2022-01-21 DIAGNOSIS — M25552 Pain in left hip: Secondary | ICD-10-CM | POA: Diagnosis not present

## 2022-01-22 DIAGNOSIS — M180 Bilateral primary osteoarthritis of first carpometacarpal joints: Secondary | ICD-10-CM | POA: Diagnosis not present

## 2022-01-26 DIAGNOSIS — Z1231 Encounter for screening mammogram for malignant neoplasm of breast: Secondary | ICD-10-CM | POA: Diagnosis not present

## 2022-01-26 DIAGNOSIS — M7062 Trochanteric bursitis, left hip: Secondary | ICD-10-CM | POA: Diagnosis not present

## 2022-01-26 LAB — HM MAMMOGRAPHY

## 2022-01-28 ENCOUNTER — Other Ambulatory Visit: Payer: Self-pay | Admitting: Medical

## 2022-01-29 ENCOUNTER — Other Ambulatory Visit: Payer: Self-pay | Admitting: Medical

## 2022-02-01 DIAGNOSIS — M79652 Pain in left thigh: Secondary | ICD-10-CM | POA: Diagnosis not present

## 2022-02-01 DIAGNOSIS — M4310 Spondylolisthesis, site unspecified: Secondary | ICD-10-CM | POA: Diagnosis not present

## 2022-02-07 ENCOUNTER — Encounter: Payer: Self-pay | Admitting: Certified Registered Nurse Anesthetist

## 2022-02-13 ENCOUNTER — Ambulatory Visit (AMBULATORY_SURGERY_CENTER): Payer: Medicare Other | Admitting: Gastroenterology

## 2022-02-13 ENCOUNTER — Encounter: Payer: Self-pay | Admitting: Gastroenterology

## 2022-02-13 VITALS — BP 108/77 | HR 65 | Temp 97.3°F | Resp 11 | Ht 66.5 in | Wt 213.0 lb

## 2022-02-13 DIAGNOSIS — K222 Esophageal obstruction: Secondary | ICD-10-CM

## 2022-02-13 DIAGNOSIS — K219 Gastro-esophageal reflux disease without esophagitis: Secondary | ICD-10-CM

## 2022-02-13 MED ORDER — SODIUM CHLORIDE 0.9 % IV SOLN: 500.0000 mL | Freq: Once | INTRAVENOUS | Status: DC

## 2022-02-13 MED ORDER — SUCRALFATE 1 G PO TABS: ORAL_TABLET | ORAL | 6 refills | Status: DC

## 2022-02-13 NOTE — Progress Notes (Signed)
0802 Robinul 0.1 mg IV given due large amount of secretions upon assessment.  MD made aware, vss

## 2022-02-13 NOTE — Patient Instructions (Addendum)
HANDOUTS PROVIDED ON: POST DILATION DIET, GASTRITIS, HIATAL HERNIA, & ESOPHAGEAL STRICTURE  The biopsies taken today have been sent for pathology.  The results can take 1-3 weeks to receive.  You should have a repeat upper endoscopy in 3 years.    You may resume your medication schedule.  Begin taking your Carafate as a slurry by dissolving the tablet 4 times daily and reduce to 2 times daily over time.  A new prescription with refills has been sent to your pharmacy.  Your diet today should consist of clear liquids only until 9:30 am then soft foods for the rest of the day.  You may advance your diet as tolerated starting tomorrow.  Thank you for allowing [place] to care for you today!!!  YOU HAD AN ENDOSCOPIC PROCEDURE TODAY AT [place]:   Refer to the procedure report that was given to you for any specific questions about what was found during the examination.  If the procedure report does not answer your questions, please call your gastroenterologist to clarify.  If you requested that your care partner not be given the details of your procedure findings, then the procedure report has been included in a sealed envelope for you to review at your convenience later.  YOU SHOULD EXPECT: Some feelings of bloating in the abdomen. Passage of more gas than usual.  Walking can help get rid of the air that was put into your GI tract during the procedure and reduce the bloating. If you had a lower endoscopy (such as a colonoscopy or flexible sigmoidoscopy) you may notice spotting of blood in your stool or on the toilet paper. If you underwent a bowel prep for your procedure, you may not have a normal bowel movement for a few days.  Please Note:  You might notice some irritation and congestion in your nose or some drainage.  This is from the oxygen used during your procedure.  There is no need for concern and it should clear up in a day or so.  SYMPTOMS TO REPORT IMMEDIATELY:  Following lower  endoscopy (colonoscopy or flexible sigmoidoscopy):  Excessive amounts of blood in the stool  Significant tenderness or worsening of abdominal pains  Swelling of the abdomen that is new, acute  Fever of 100F or higher  Following upper endoscopy (EGD)  Vomiting of blood or coffee ground material  New chest pain or pain under the shoulder blades  Painful or persistently difficult swallowing  New shortness of breath  Fever of 100F or higher  Black, tarry-looking stools  For urgent or emergent issues, a gastroenterologist can be reached at any hour by calling [phone]. Do not use MyChart messaging for urgent concerns.    DIET:  We do recommend clear liquids until 9:30 am then soft foods the rest of today.  You may advance your diet as tolerated beginning tomorrow.  Drink plenty of fluids but you should avoid alcoholic beverages for 24 hours.  ACTIVITY:  You should plan to take it easy for the rest of today and you should NOT DRIVE or use heavy machinery until tomorrow (because of the sedation medicines used during the test).    FOLLOW UP: Our staff will call the number listed on your records the next business day following your procedure.  We will call around 7:15- 8:00 am to check on you and address any questions or concerns that you may have regarding the information given to you following your procedure. If we do not reach  you, we will leave a message.     If any biopsies were taken you will be contacted by phone or by letter within the next 1-3 weeks.  Please call us at [phone] if you have not heard about the biopsies in 3 weeks.    SIGNATURES/CONFIDENTIALITY: You and/or your care partner have signed paperwork which will be entered into your electronic medical record.  These signatures attest to the fact that that the information above on your After Visit Summary has been reviewed and is understood.  Full responsibility of the confidentiality of this discharge information  lies with you and/or your care-partner.

## 2022-02-13 NOTE — Progress Notes (Signed)
Report given to PACU, vss 

## 2022-02-13 NOTE — Progress Notes (Signed)
Called to room to assist during endoscopic procedure.  Patient ID and intended procedure confirmed with present staff. Received instructions for my participation in the procedure from the performing physician.  

## 2022-02-13 NOTE — Op Note (Signed)
[facility] Patient Name: Kristi Avila Procedure Date: 02/13/2022 7:17 AM MRN: [id number] Endoscopist: Justice Britain , MD, [id number] Age: 67 Referring MD:  Date of Birth: [birth year] Gender: Female Account #: [id number] Procedure:                Upper GI endoscopy Indications:              Dysphagia, Heartburn, Esophageal reflux symptoms                            that recur despite appropriate therapy, Barrett's                            esophagus Medicines:                Monitored Anesthesia Care Procedure:                Pre-Anesthesia Assessment:                           - Prior to the procedure, a History and Physical                            was performed, and patient medications and                            allergies were reviewed. The patient's tolerance of                            previous anesthesia was also reviewed. The risks                            and benefits of the procedure and the sedation                            options and risks were discussed with the patient.                            All questions were answered, and informed consent                            was obtained. Prior Anticoagulants: The patient has                            taken no anticoagulant or antiplatelet agents                            except for NSAID medication. ASA Grade Assessment:                            II - A patient with mild systemic disease. After                            reviewing the risks and benefits, the patient was  deemed in satisfactory condition to undergo the                            procedure.                           After obtaining informed consent, the endoscope was                            passed under direct vision. Throughout the                            procedure, the patient's blood pressure, pulse, and                            oxygen saturations were monitored continuously. The                             GIF [id number] #[id number] was introduced through the                            mouth, and advanced to the second part of duodenum.                            The upper GI endoscopy was accomplished without                            difficulty. The patient tolerated the procedure. Scope In: Scope Out: Findings:                 No gross lesions were noted in the majority of the                            esophagus. Biopsies were taken with a cold forceps                            for histology to rule out EOE/LOE.                           The esophagus and gastroesophageal junction were                            examined with white light and narrow band imaging                            (NBI) from a forward view and retroflexed position.                            There were esophageal mucosal changes consistent                            with short-segment Barrett's esophagus in the very  distal aspect of the esophagus. These changes                            involved the mucosa along an irregular Z-line (40                            cm from the incisors). One tongue of salmon-colored                            mucosa was present from 39 to 40 cm and scattered                            islands of salmon-colored mucosa were present from                            39 to 40 cm. The maximum longitudinal extent of                            these esophageal mucosal changes was 1 cm in                            length. Mucosa was biopsied with a cold forceps for                            histology in a targeted manner and in 4 quadrants.                            One specimen bottle was sent to pathology.                           A non-obstructing Schatzki ring was found at the                            gastroesophageal junction. Biopsies were taken with                            a cold forceps for disruption purposes.                            After the rest of the EGD was completed, a                            guidewire was placed and the scope was withdrawn.                            Dilation was performed in the esophagus with a                            Savary dilator with mild resistance at 18 mm. The                            dilation site was examined following endoscope  reinsertion and showed no change.                           A 2 cm hiatal hernia was present.                           Patchy mildly erythematous mucosa without bleeding                            was found in the entire examined stomach. Biopsies                            were taken with a cold forceps for histology and                            Helicobacter pylori testing.                           No gross lesions were noted in the duodenal bulb,                            in the first portion of the duodenum and in the                            second portion of the duodenum. Complications:            No immediate complications. Estimated Blood Loss:     Estimated blood loss was minimal. Impression:               - No gross lesions in the majority of the                            esophagus. Biopsied.                           - Esophageal mucosal changes consistent with                            short-segment Barrett's esophagus distally.                            Biopsied.                           - Non-obstructing Schatzki ring. Disrupted.                           - Empiric dilation performed in the esophagus.                           - 2 cm hiatal hernia.                           - Erythematous mucosa in the stomach. Biopsied.                           -  No gross lesions in the duodenal bulb, in the                            first portion of the duodenum and in the second                            portion of the duodenum. Recommendation:           - The patient will be observed post-procedure,                             until all discharge criteria are met.                           - Discharge patient to home.                           - Patient has a contact number available for                            emergencies. The signs and symptoms of potential                            delayed complications were discussed with the                            patient. Return to normal activities tomorrow.                            Written discharge instructions were provided to the                            patient.                           - Dilation diet as per protocol.                           - Transition Carafate from tablet form to tablet                            made as a slurry by dissolving and using as this                            may have more effectiveness for patient (she will                            trial this) and hopefully be able to come down from                            4 times daily to 2 times daily over time.                           - Query potential use of Aciphex twice daily  in                            future.                           - Continue present medications for now.                           - Await pathology results.                           - Repeat upper endoscopy in 3 years for                            surveillance of Barrett's esophagus.                           - If dilatation has been helpful for dysphagia                            symptoms could consider doing again on a as needed                            basis pending how long it lasts. If no significant                            improvement, then esophageal manometry would be                            considered.                           - Overt signs of progressive GERD are not present                            today though she is on high-dose Dexilant as well                            as famotidine as well as Carafate. Could consider                            pH  impedance testing in the future.                           - Follow-up in clinic to discuss next steps in                            evaluation.                           - The findings and recommendations were discussed                            with the patient.                           -  The findings and recommendations were discussed                            with the patient's family. Justice Britain, MD 02/13/2022 8:36:30 AM

## 2022-02-13 NOTE — Progress Notes (Signed)
GASTROENTEROLOGY PROCEDURE H&P NOTE   Primary Care Physician: Carlena Hurl, PA-C  HPI: Kristi Avila is a 67 y.o. female who presents for EGD for evaluation of progressive GERD, hx of Barrett's, dysphagia.  Past Medical History:  Diagnosis Date   Arthritis    Barrett's esophagus with esophagitis 07/23/2012   EGD: CLO test results negative. pathology: Barretss: Repeat EGD 3 years   Blood transfusion without reported diagnosis    remote past   Cancer ([place])    squamous cell CA- on legs   Depression    Family history of anesthesia complication    mother trouble waking up   Frequency of urination    GERD (gastroesophageal reflux disease)    h/o peptic ulcer   High cholesterol    HSV-2 infection    anal   Nocturia    Obesity    Overactive bladder    Reflux    Urgency of urination    UTI (urinary tract infection)    Past Surgical History:  Procedure Laterality Date   ARTHROSCOPY KNEE W/ DRILLING     X4 ON RT KNEE   CARPAL TUNNEL RELEASE Right 2000   COLONOSCOPY     colonscopy  2007, 2017   ESOPHAGOGASTRODUODENOSCOPY  2007   Dr. Ferdinand Lango, [place]   foot fibroma surgery  all before 1985   x 3   HEMORRHOID SURGERY  04/2006   HIP FRACTURE SURGERY Right 2018   KNEE ARTHROSCOPY     X2 LEFT KNEE   LIPOMA EXCISION  11/2020   SINUS EXPLORATION  1995   TOE AMPUTATION  1959   TOTAL HIP ARTHROPLASTY Left 2022   TOTAL KNEE ARTHROPLASTY Right 09/22/2012   Procedure: RIGHT TOTAL KNEE ARTHROPLASTY;  Surgeon: [name], MD;  Location: [facility];  Service: Orthopedics;  Laterality: Right;   TOTAL KNEE ARTHROPLASTY Left 09/28/2013   Procedure: LEFT TOTAL KNEE ARTHROPLASTY, CORTISONE INJECTION RIGHT HIP;  Surgeon: [name], MD;  Location: [facility];  Service: Orthopedics;  Laterality: Left;   UPPER GASTROINTESTINAL ENDOSCOPY     Current Outpatient Medications  Medication Sig Dispense Refill   ammonium lactate (AMLACTIN) 12 % lotion Apply 1 Application topically  as needed for dry skin. 400 g 0   dexlansoprazole (DEXILANT) 60 MG capsule TAKE 1 CAPSULE(60 MG) BY MOUTH DAILY 60 capsule 1   DULoxetine (CYMBALTA) 60 MG capsule Take 1 capsule (60 mg total) by mouth 2 (two) times daily. 180 capsule 0   ezetimibe (ZETIA) 10 MG tablet TAKE 1 TABLET(10 MG) BY MOUTH DAILY 90 tablet 3   famotidine (PEPCID) 40 MG tablet One tablet at bedtime 30 tablet 5   gabapentin (NEURONTIN) 100 MG capsule 3 (three) times daily.     meloxicam (MOBIC) 15 MG tablet TAKE 1 TABLET(15 MG) BY MOUTH DAILY 90 tablet 0   MULTIPLE VITAMIN PO Take 1 tablet by mouth daily.     Plecanatide (TRULANCE) 3 MG TABS Take 1 tablet by mouth daily. 90 tablet 3   Polyethyl Glycol-Propyl Glycol (SYSTANE FREE OP) Apply to eye.     psyllium (METAMUCIL) 58.6 % powder Take by mouth. 2 teaspoons nightly     RESTASIS 0.05 % ophthalmic emulsion Place 1 drop into both eyes 2 (two) times daily.     rosuvastatin (CRESTOR) 5 MG tablet TAKE 1 TABLET(5 MG) BY MOUTH EVERY OTHER DAY 90 tablet 1   sucralfate (CARAFATE) 1 g tablet TAKE 1 TABLET(1 GRAM) BY MOUTH FOUR TIMES DAILY AT BEDTIME  WITH MEALS 270 tablet 0   valACYclovir (VALTREX) 500 MG tablet Take 1 tablet (500 mg total) by mouth daily. 90 tablet 1   ZINC-VITAMIN C PO Take 1 tablet by mouth daily in the afternoon.     ALPRAZolam (XANAX) 0.25 MG tablet PRN     esomeprazole (NEXIUM) 40 MG capsule Take 1 capsule (40 mg total) by mouth daily. 30 capsule 3   Magnesium 400 MG TABS Take 1 capsule by mouth daily in the afternoon.     Probiotic Product (PROBIOTIC PO) Take 1 tablet by mouth daily in the afternoon.     Current Facility-Administered Medications  Medication Dose Route Frequency Provider Last Rate Last Admin   0.9 %  sodium chloride infusion  500 mL Intravenous Once Mansouraty, Telford Nab., MD        Current Outpatient Medications:    ammonium lactate (AMLACTIN) 12 % lotion, Apply 1 Application topically as needed for dry skin., Disp: 400 g, Rfl: 0    dexlansoprazole (DEXILANT) 60 MG capsule, TAKE 1 CAPSULE(60 MG) BY MOUTH DAILY, Disp: 60 capsule, Rfl: 1   DULoxetine (CYMBALTA) 60 MG capsule, Take 1 capsule (60 mg total) by mouth 2 (two) times daily., Disp: 180 capsule, Rfl: 0   ezetimibe (ZETIA) 10 MG tablet, TAKE 1 TABLET(10 MG) BY MOUTH DAILY, Disp: 90 tablet, Rfl: 3   famotidine (PEPCID) 40 MG tablet, One tablet at bedtime, Disp: 30 tablet, Rfl: 5   gabapentin (NEURONTIN) 100 MG capsule, 3 (three) times daily., Disp: , Rfl:    meloxicam (MOBIC) 15 MG tablet, TAKE 1 TABLET(15 MG) BY MOUTH DAILY, Disp: 90 tablet, Rfl: 0   MULTIPLE VITAMIN PO, Take 1 tablet by mouth daily., Disp: , Rfl:    Plecanatide (TRULANCE) 3 MG TABS, Take 1 tablet by mouth daily., Disp: 90 tablet, Rfl: 3   Polyethyl Glycol-Propyl Glycol (SYSTANE FREE OP), Apply to eye., Disp: , Rfl:    psyllium (METAMUCIL) 58.6 % powder, Take by mouth. 2 teaspoons nightly, Disp: , Rfl:    RESTASIS 0.05 % ophthalmic emulsion, Place 1 drop into both eyes 2 (two) times daily., Disp: , Rfl:    rosuvastatin (CRESTOR) 5 MG tablet, TAKE 1 TABLET(5 MG) BY MOUTH EVERY OTHER DAY, Disp: 90 tablet, Rfl: 1   sucralfate (CARAFATE) 1 g tablet, TAKE 1 TABLET(1 GRAM) BY MOUTH FOUR TIMES DAILY AT BEDTIME WITH MEALS, Disp: 270 tablet, Rfl: 0   valACYclovir (VALTREX) 500 MG tablet, Take 1 tablet (500 mg total) by mouth daily., Disp: 90 tablet, Rfl: 1   ZINC-VITAMIN C PO, Take 1 tablet by mouth daily in the afternoon., Disp: , Rfl:    ALPRAZolam (XANAX) 0.25 MG tablet, PRN, Disp: , Rfl:    esomeprazole (NEXIUM) 40 MG capsule, Take 1 capsule (40 mg total) by mouth daily., Disp: 30 capsule, Rfl: 3   Magnesium 400 MG TABS, Take 1 capsule by mouth daily in the afternoon., Disp: , Rfl:    Probiotic Product (PROBIOTIC PO), Take 1 tablet by mouth daily in the afternoon., Disp: , Rfl:   Current Facility-Administered Medications:    0.9 %  sodium chloride infusion, 500 mL, Intravenous, Once, Mansouraty, Telford Nab., MD Allergies  Allergen Reactions   Cephalosporins Diarrhea   Codeine Itching   Latex Itching   Pravastatin     Myalgias   Simvastatin     myalgias   Sulfa Antibiotics Diarrhea   Family History  Problem Relation Age of Onset   Arthritis Mother    Pulmonary  fibrosis Father        pulmonary fibrosis   Pulmonary disease Father    Cancer Sister        pancreatic   Diabetes Brother    Colon cancer Neg Hx    Esophageal cancer Neg Hx    Stomach cancer Neg Hx    Rectal cancer Neg Hx    Social History   Socioeconomic History   Marital status: Divorced    Spouse name: Not on file   Number of children: 0   Years of education: Not on file   Highest education level: Not on file  Occupational History   Occupation: Diplomatic Services operational officer [employer]  Tobacco Use   Smoking status: Never   Smokeless tobacco: Never  Vaping Use   Vaping Use: Never used  Substance and Sexual Activity   Alcohol use: Yes    Alcohol/week: 2.0 standard drinks of alcohol    Types: 2 Cans of beer per week    Comment: 2 drinks a week   Drug use: No   Sexual activity: Not Currently    Birth control/protection: Post-menopausal  Other Topics Concern   Not on file  Social History Narrative   Lives alone, 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  04/2020   Social Determinants of Health   Financial Resource Strain: Not on file  Food Insecurity: Not on file  Transportation Needs: Not on file  Physical Activity: Not on file  Stress: Not on file  Social Connections: Not on file  Intimate Partner Violence: Not on file    Physical Exam: Today's Vitals   02/13/22 0712 02/13/22 0713 02/13/22 0802 02/13/22 0805  BP: 122/82  132/83 121/78  Pulse: 66  64 61  Resp:   13 11  Temp: (!) 97.3 F (36.3 C) (!) 97.3 F (36.3 C)    TempSrc: Skin     SpO2: 98%  98% 96%  Weight: 213 lb (96.6 kg)     Height: 5' 6.5" (1.689 m)      Body mass index is 33.86 kg/m. GEN: NAD EYE:  Sclerae anicteric ENT: MMM CV: Non-tachycardic GI: Soft, NT/ND NEURO:  Alert & Oriented x 3  Lab Results: No results for input(s): "WBC", "HGB", "HCT", "PLT" in the last 72 hours. BMET No results for input(s): "NA", "K", "CL", "CO2", "GLUCOSE", "BUN", "CREATININE", "CALCIUM" in the last 72 hours. LFT No results for input(s): "PROT", "ALBUMIN", "AST", "ALT", "ALKPHOS", "BILITOT", "BILIDIR", "IBILI" in the last 72 hours. PT/INR No results for input(s): "LABPROT", "INR" in the last 72 hours.   Impression / Plan: This is a 66 y.o.female who presents for EGD for evaluation of progressive GERD, hx of Barrett's, dysphagia.  The risks and benefits of endoscopic evaluation/treatment were discussed with the patient and/or family; these include but are not limited to the risk of perforation, infection, bleeding, missed lesions, lack of diagnosis, severe illness requiring hospitalization, as well as anesthesia and sedation related illnesses.  The patient's history has been reviewed, patient examined, no change in status, and deemed stable for procedure.  The patient and/or family is agreeable to proceed.    Justice Britain, MD [facility] Office # [phone]

## 2022-02-14 ENCOUNTER — Telehealth: Payer: Self-pay | Admitting: *Deleted

## 2022-02-14 DIAGNOSIS — C44729 Squamous cell carcinoma of skin of left lower limb, including hip: Secondary | ICD-10-CM | POA: Diagnosis not present

## 2022-02-14 DIAGNOSIS — D224 Melanocytic nevi of scalp and neck: Secondary | ICD-10-CM | POA: Diagnosis not present

## 2022-02-14 DIAGNOSIS — L820 Inflamed seborrheic keratosis: Secondary | ICD-10-CM | POA: Diagnosis not present

## 2022-02-14 DIAGNOSIS — L821 Other seborrheic keratosis: Secondary | ICD-10-CM | POA: Diagnosis not present

## 2022-02-14 DIAGNOSIS — Z85828 Personal history of other malignant neoplasm of skin: Secondary | ICD-10-CM | POA: Diagnosis not present

## 2022-02-14 DIAGNOSIS — L570 Actinic keratosis: Secondary | ICD-10-CM | POA: Diagnosis not present

## 2022-02-14 NOTE — Telephone Encounter (Signed)
Follow up call attempt.  LVM to call if any questions or concerns. 

## 2022-02-16 ENCOUNTER — Encounter: Payer: Self-pay | Admitting: Gastroenterology

## 2022-02-20 ENCOUNTER — Other Ambulatory Visit: Payer: Self-pay | Admitting: Medical

## 2022-02-21 DIAGNOSIS — M79605 Pain in left leg: Secondary | ICD-10-CM | POA: Diagnosis not present

## 2022-02-22 NOTE — Telephone Encounter (Signed)
PT ASSISTANCE DEXILANT 2024 approved per pt

## 2022-02-23 NOTE — Telephone Encounter (Signed)
Pt approved for Pt Assistance

## 2022-02-28 ENCOUNTER — Telehealth: Payer: Self-pay | Admitting: Medical

## 2022-02-28 NOTE — Telephone Encounter (Signed)
Pt dropped off application for PT ASSISTANCE TRULANCE

## 2022-03-01 DIAGNOSIS — M4310 Spondylolisthesis, site unspecified: Secondary | ICD-10-CM | POA: Diagnosis not present

## 2022-03-06 ENCOUNTER — Encounter: Payer: BC Managed Care – PPO | Admitting: Nurse Practitioner

## 2022-03-14 LAB — HM PAP SMEAR

## 2022-04-02 NOTE — Telephone Encounter (Signed)
[facility] t# [phone] to follow up on application, states was denied, pt needs to apply for LIS first then can send denial letter back to them to be reconsidered.  LIS T# [phone] or online at [email or web address] help.  Called pt and gave info and she requested samples to hold.

## 2022-04-02 NOTE — Telephone Encounter (Signed)
Pt states her ins does cover but cost if $100 a month

## 2022-04-04 ENCOUNTER — Other Ambulatory Visit: Payer: Self-pay | Admitting: Medical

## 2022-04-04 DIAGNOSIS — K219 Gastro-esophageal reflux disease without esophagitis: Secondary | ICD-10-CM

## 2022-04-27 ENCOUNTER — Other Ambulatory Visit: Payer: Self-pay | Admitting: Medical

## 2022-04-27 NOTE — Telephone Encounter (Signed)
Refill request last apt 11/23/21 next apt 06/04/22.

## 2022-05-09 ENCOUNTER — Other Ambulatory Visit: Payer: Self-pay | Admitting: *Deleted

## 2022-05-09 ENCOUNTER — Telehealth: Payer: Self-pay | Admitting: Medical

## 2022-05-09 MED ORDER — TRULANCE 3 MG PO TABS: 1.0000 | ORAL_TABLET | Freq: Every day | ORAL | 0 refills | Status: DC

## 2022-05-09 NOTE — Telephone Encounter (Signed)
Contacted [facility] to schedule their annual wellness visit. Welcome to Medicare visit Due by 12/16/22.  Rudell Cobb AWV direct phone # [phone]   WTM before 12/16/22 per [facility]

## 2022-05-20 ENCOUNTER — Other Ambulatory Visit: Payer: Self-pay | Admitting: Medical

## 2022-05-21 NOTE — Telephone Encounter (Signed)
Has upcoming appointment soon 

## 2022-05-23 ENCOUNTER — Encounter: Payer: BC Managed Care – PPO | Admitting: Medical

## 2022-06-04 ENCOUNTER — Ambulatory Visit (INDEPENDENT_AMBULATORY_CARE_PROVIDER_SITE_OTHER): Payer: Medicare Other | Admitting: Medical

## 2022-06-04 VITALS — BP 118/64 | HR 81 | Wt 219.2 lb

## 2022-06-04 DIAGNOSIS — L989 Disorder of the skin and subcutaneous tissue, unspecified: Secondary | ICD-10-CM

## 2022-06-04 DIAGNOSIS — M2550 Pain in unspecified joint: Secondary | ICD-10-CM

## 2022-06-04 DIAGNOSIS — L570 Actinic keratosis: Secondary | ICD-10-CM

## 2022-06-04 DIAGNOSIS — E785 Hyperlipidemia, unspecified: Secondary | ICD-10-CM | POA: Diagnosis not present

## 2022-06-04 DIAGNOSIS — F419 Anxiety disorder, unspecified: Secondary | ICD-10-CM | POA: Diagnosis not present

## 2022-06-04 DIAGNOSIS — F32A Depression, unspecified: Secondary | ICD-10-CM | POA: Diagnosis not present

## 2022-06-04 DIAGNOSIS — Z79899 Other long term (current) drug therapy: Secondary | ICD-10-CM | POA: Diagnosis not present

## 2022-06-04 DIAGNOSIS — M2540 Effusion, unspecified joint: Secondary | ICD-10-CM | POA: Diagnosis not present

## 2022-06-04 DIAGNOSIS — R0981 Nasal congestion: Secondary | ICD-10-CM

## 2022-06-04 LAB — CBC WITH DIFFERENTIAL/PLATELET
Basophils Absolute: 0.1 10*3/uL (ref 0.0–0.2)
Basos: 1 %
Hemoglobin: 12.4 g/dL (ref 11.1–15.9)
Immature Grans (Abs): 0 10*3/uL (ref 0.0–0.1)
Monocytes Absolute: 0.4 10*3/uL (ref 0.1–0.9)
RDW: 13.6 % (ref 11.7–15.4)

## 2022-06-04 LAB — CMP14+EGFR

## 2022-06-04 MED ORDER — ROSUVASTATIN CALCIUM 5 MG PO TABS: ORAL_TABLET | ORAL | 1 refills | Status: DC

## 2022-06-04 MED ORDER — DEXLANSOPRAZOLE 60 MG PO CPDR: DELAYED_RELEASE_CAPSULE | ORAL | 3 refills | Status: DC

## 2022-06-04 MED ORDER — VALACYCLOVIR HCL 500 MG PO TABS: 500.0000 mg | ORAL_TABLET | Freq: Every day | ORAL | 1 refills | Status: DC

## 2022-06-04 MED ORDER — DULOXETINE HCL 60 MG PO CPEP: 60.0000 mg | ORAL_CAPSULE | Freq: Two times a day (BID) | ORAL | 1 refills | Status: DC

## 2022-06-04 MED ORDER — EZETIMIBE 10 MG PO TABS: ORAL_TABLET | ORAL | 2 refills | Status: DC

## 2022-06-04 MED ORDER — ALPRAZOLAM 0.25 MG PO TABS: 0.2500 mg | ORAL_TABLET | Freq: Every evening | ORAL | 0 refills | Status: DC | PRN

## 2022-06-04 NOTE — Progress Notes (Signed)
Subjective:  Kristi Avila is a 67 y.o. female who presents for Chief Complaint  Patient presents with   Medical Management of Chronic Issues    Med check- would like to get some xanax and has a spot on left thigh. Has arthritis in hands.      Here for med check.  Has several concerns  She has a skin lesion on her left upper thigh that she would like frozen or cryotherapy.  she has had several of these before.  This 1 seems to be getting bigger and is itchy.  In the past they have been actinic keratoses.  She also notes a history of prior skin cancers, normally sees dermatology but could not get any appointment in the near future.  She would like a refill on alprazolam Xanax.  She uses this periodically.  She gets a lot of anxiety in general but particularly in dealing with her mom's health issues.  Mother is old, crippled, depressed, wants Kristi Avila to be with her all the time.   Val[place]e has a lot of anxiety dealing with her and her health issues.  She continues on Cymbalta regularly as well.  Not currently in counseling or seeing psychiatry.  She has had some ongoing congestion including runny nose, nasal congestion, itchy eyes for a few weeks.  Hyperlipidemia-uses Crestor every other day, and uses Zetia daily, wants to recheck her liver test today.  She gets her Trulance and Dexilant from patient assistance  She uses Carafate   She notes arthritic changes in her hands.  Has multiple joints that hurt.  She does get morning stiffness.  Particularly she has some fingers better following occurred.  She has numbness in her wrist bilaterally but not necessarily her fingers.  She has made an appointment with hand specialist that she will see soon, Dr. Merlyn Lot  No other aggravating or relieving factors.    No other c/o.  Past Medical History:  Diagnosis Date   Arthritis    Barrett's esophagus with esophagitis 07/23/2012   EGD: CLO test results negative. pathology: Barretss: Repeat EGD 3 years    Blood transfusion without reported diagnosis    remote past   Cancer (HCC)    squamous cell CA- on legs   Depression    Family history of anesthesia complication    mother trouble waking up   Frequency of urination    GERD (gastroesophageal reflux disease)    h/o peptic ulcer   High cholesterol    HSV-2 infection    anal   Nocturia    Obesity    Overactive bladder    Reflux    Urgency of urination    UTI (urinary tract infection)    Current Outpatient Medications on File Prior to Visit  Medication Sig Dispense Refill   ammonium lactate (AMLACTIN) 12 % lotion Apply 1 Application topically as needed for dry skin. 400 g 0   famotidine (PEPCID) 40 MG tablet One tablet at bedtime (Patient taking differently: Take 40 mg by mouth at bedtime. One tablet at bedtime) 30 tablet 5   gabapentin (NEURONTIN) 100 MG capsule 3 (three) times daily.     meloxicam (MOBIC) 15 MG tablet TAKE 1 TABLET(15 MG) BY MOUTH DAILY 90 tablet 0   MULTIPLE VITAMIN PO Take 1 tablet by mouth daily.     Plecanatide (TRULANCE) 3 MG TABS Take 1 tablet (3 mg total) by mouth daily. 12 tablet 0   Polyethyl Glycol-Propyl Glycol (SYSTANE FREE OP) Apply to eye.  psyllium (METAMUCIL) 58.6 % powder Take by mouth. 2 teaspoons nightly     RESTASIS 0.05 % ophthalmic emulsion Place 1 drop into both eyes 2 (two) times daily.     sucralfate (CARAFATE) 1 g tablet TAKE 1 TABLET(1 GRAM) BY MOUTH FOUR TIMES DAILY WITH MEALS AND AT BEDTIME 270 tablet 0   ZINC-VITAMIN C PO Take 1 tablet by mouth daily in the afternoon.     No current facility-administered medications on file prior to visit.     The following portions of the patient's history were reviewed and updated as appropriate: allergies, current medications, past family history, past medical history, past social history, past surgical history and problem list.  ROS Otherwise as in   Objective: BP 118/64   Pulse 81   Wt 219 lb 3.2 oz (99.4 kg)   LMP 12/31/2019    BMI 34.85 kg/m   General appearance: alert, no distress, well developed, well nourished Skin: left inner upper thigh with 1cm roundish somewhat raised papular lesion, numerous scattered macules and solar keratoses of legs HEENT: normocephalic, sclerae anicteric, conjunctiva pink and moist, TMs pearly, nares patent, no discharge or erythema, pharynx normal Oral cavity: MMM, no lesions Neck: supple, no lymphadenopathy, no thyromegaly, no masses Heart: RRR, normal S1, S2, no murmurs Lungs: CTA bilaterally, no wheezes, rhonchi, or rales MSK, bony arthritic changes of right index finger DIP and PIP as well as bilateral small finger DIP and PIPs, no obvious joint swelling of the hands or wrists Pulses: 2+ radial pulses, 2+ pedal pulses, normal cap refill Ext: no edema     Assessment: Encounter Diagnoses  Name Primary?   Anxiety and depression Yes   Medication management    Hyperlipidemia, unspecified hyperlipidemia type    Polyarthralgia    Joint swelling    Actinic keratosis    Skin lesion [L98.9]    Head congestion      Plan: Anxiety and Depression Continue Cymbalta 60mg  BID, xanax daily prn limited use Work on stress reduction where possible  Hyperlipidemia Continue Crestor 5mg  QOD and zetia 10mg  daily Labs at goal 11/2021  Polyarthralgia, joint swelling Rheum screening labs today Finger exam suggests OA, but symptoms including morning stiffness, multiple finger and hand joint arthritis changes, some joint swelling noted Follow up as scheduled with hand specialist  Hand numbness, likely carpal tunnel syndrome Follow up with hand specialist  Head congestion Advised Claritin daily for the next week or 2, if worse in the meantime, call back   Skin lesion Discussed findings, discussed possible treatment.  She wants to do cryotherapy, risks/benefits.  She gives consent.  Applied Verrucafreeze cryotherapy to the left upper thigh lesion.  Discussed wound care.  F/u  prn   Kristi Avila was seen today for medical management of chronic issues.  Diagnoses and all orders for this visit:  Anxiety and depression  Medication management -     CBC with Differential/Platelet -     CMP14+EGFR  Hyperlipidemia, unspecified hyperlipidemia type -     CBC with Differential/Platelet -     CMP14+EGFR  Polyarthralgia -     Sedimentation rate -     CYCLIC CITRUL PEPTIDE ANTIBODY, IGG/IGA  Joint swelling -     Sedimentation rate -     CYCLIC CITRUL PEPTIDE ANTIBODY, IGG/IGA  Actinic keratosis  Skin lesion [L98.9]  Head congestion  Other orders -     dexlansoprazole (DEXILANT) 60 MG capsule; TAKE 1 CAPSULE(60 MG) BY MOUTH DAILY -     DULoxetine (CYMBALTA)  60 MG capsule; Take 1 capsule (60 mg total) by mouth 2 (two) times daily. -     ezetimibe (ZETIA) 10 MG tablet; TAKE 1 TABLET(10 MG) BY MOUTH DAILY -     rosuvastatin (CRESTOR) 5 MG tablet; TAKE 1 TABLET(5 MG) BY MOUTH EVERY OTHER DAY -     valACYclovir (VALTREX) 500 MG tablet; Take 1 tablet (500 mg total) by mouth daily. -     ALPRAZolam (XANAX) 0.25 MG tablet; Take 1 tablet (0.25 mg total) by mouth at bedtime as needed for anxiety. PRN    Follow up: pending labs

## 2022-06-05 LAB — CYCLIC CITRUL PEPTIDE ANTIBODY, IGG/IGA: Cyclic Citrullin Peptide Ab: 6 units (ref 0–19)

## 2022-06-05 LAB — CMP14+EGFR
ALT: 20 IU/L (ref 0–32)
Bilirubin Total: <0.2 mg/dL (ref 0.0–1.2)
Chloride: 104 mmol/L (ref 96–106)
Creatinine, Ser: 0.88 mg/dL (ref 0.57–1.00)
Globulin, Total: 2.1 g/dL (ref 1.5–4.5)
Sodium: 140 mmol/L (ref 134–144)
Total Protein: 6.4 g/dL (ref 6.0–8.5)
eGFR: 73 mL/min/{1.73_m2} (ref >59)

## 2022-06-05 LAB — CBC WITH DIFFERENTIAL/PLATELET
EOS (ABSOLUTE): 0.1 10*3/uL (ref 0.0–0.4)
Eos: 2 %
Hematocrit: 38.4 % (ref 34.0–46.6)
Immature Granulocytes: 0 %
Lymphocytes Absolute: 2.2 10*3/uL (ref 0.7–3.1)
Lymphs: 35 %
MCH: 28 pg (ref 26.6–33.0)
MCHC: 32.3 g/dL (ref 31.5–35.7)
MCV: 87 fL (ref 79–97)
Monocytes: 7 %
Neutrophils Absolute: 3.4 10*3/uL (ref 1.4–7.0)
Neutrophils: 55 %
Platelets: 396 10*3/uL (ref 150–450)
RBC: 4.43 x10E6/uL (ref 3.77–5.28)
WBC: 6.2 10*3/uL (ref 3.4–10.8)

## 2022-06-05 LAB — SEDIMENTATION RATE: Sed Rate: 9 mm/hr (ref 0–40)

## 2022-06-05 NOTE — Progress Notes (Signed)
Results sent through MyChart

## 2022-06-07 NOTE — Telephone Encounter (Signed)
Pt was approved til 01/15/23, called pt & informed & she has to call them for refills when she needs

## 2022-06-13 ENCOUNTER — Other Ambulatory Visit: Payer: Self-pay | Admitting: Medical

## 2022-06-13 DIAGNOSIS — K219 Gastro-esophageal reflux disease without esophagitis: Secondary | ICD-10-CM

## 2022-06-15 DIAGNOSIS — M79641 Pain in right hand: Secondary | ICD-10-CM | POA: Diagnosis not present

## 2022-06-15 DIAGNOSIS — M79642 Pain in left hand: Secondary | ICD-10-CM | POA: Diagnosis not present

## 2022-06-19 DIAGNOSIS — D0472 Carcinoma in situ of skin of left lower limb, including hip: Secondary | ICD-10-CM | POA: Diagnosis not present

## 2022-06-19 DIAGNOSIS — L570 Actinic keratosis: Secondary | ICD-10-CM | POA: Diagnosis not present

## 2022-07-09 ENCOUNTER — Encounter: Payer: Self-pay | Admitting: Family Medicine

## 2022-07-09 ENCOUNTER — Ambulatory Visit (INDEPENDENT_AMBULATORY_CARE_PROVIDER_SITE_OTHER): Payer: Medicare Other | Admitting: Family Medicine

## 2022-07-09 VITALS — BP 128/78 | HR 72 | Ht 66.5 in | Wt 219.0 lb

## 2022-07-09 DIAGNOSIS — M545 Low back pain, unspecified: Secondary | ICD-10-CM

## 2022-07-09 DIAGNOSIS — G8929 Other chronic pain: Secondary | ICD-10-CM | POA: Diagnosis not present

## 2022-07-09 DIAGNOSIS — G629 Polyneuropathy, unspecified: Secondary | ICD-10-CM

## 2022-07-09 MED ORDER — GABAPENTIN 100 MG PO CAPS: ORAL_CAPSULE | ORAL | 0 refills | Status: DC

## 2022-07-09 NOTE — Progress Notes (Signed)
Chief Complaint  Patient presents with   Numbness    Numbness and tingling from waist down to her toes 3 weeks of and on. Last night her back went out, it goes out a lot. She sees neuro for her back and they recommended she come here. She also has been having numbness in arms and hands, saw hand specialist and they have ordered nerve conduction studies and they are being done in 3 weeks due her planned vacation.    She reports having some tingling off/on in her lower body (and arms/hands)--but on 6/20 it got dramatically worse. She reports that she had tingling from the umbilicus to the tip of her toes, like something crawling on her, a "weird feeling". She had rx for gabapentin (originally rx'd for radiculopathy down the side of L hip). She took 100 mg 2 hours apart x 3 doses (300mg  total dose) on 6/20, and when she woke up Friday morning, she felt better. She hasn't had any recurrent numbness down the legs. Still having numbness in the arms. She has had numbness/tingling in her arms/hands x 3 months. She thinks her grip strength is a little worse (R>L), not acutely, no recent change. She saw Dr. Merlyn Lot, suspected CTS on the L, and related to arthritis in her hand on the R.  He ordered NCV, scheduled for after her vacation. She is going on vacation Wednesday for 2 weeks.  She has chronic issues with LBP (known L4-5 issues and spinal stenosis, per pt), periodically flares. She is under the care of Dr. Danielle Dess. She called there, and was advised to see PCP today. She is currently having pain across the lower back/upper buttocks.  It is very achey.  Denies any radiation, and currently denies numbness/tingling in LE's. No foot drop or weakness in the legs.  She previously got back injections from Dr. Ethelene Hal, not recently.    PMH, PSH, SH reviewed  Outpatient Encounter Medications as of 07/09/2022  Medication Sig Note   dexlansoprazole (DEXILANT) 60 MG capsule TAKE 1 CAPSULE(60 MG) BY MOUTH DAILY     DULoxetine (CYMBALTA) 60 MG capsule Take 1 capsule (60 mg total) by mouth 2 (two) times daily.    ezetimibe (ZETIA) 10 MG tablet TAKE 1 TABLET(10 MG) BY MOUTH DAILY    famotidine (PEPCID) 20 MG tablet Take 40 mg by mouth at bedtime.    meloxicam (MOBIC) 15 MG tablet TAKE 1 TABLET(15 MG) BY MOUTH DAILY 07/09/2022: Takes one daily   methocarbamol (ROBAXIN) 500 MG tablet Take 500 mg by mouth 4 (four) times daily. 07/09/2022: Took one at 4am, old rx   MULTIPLE VITAMIN PO Take 1 tablet by mouth daily.    Plecanatide (TRULANCE) 3 MG TABS Take 1 tablet (3 mg total) by mouth daily.    Polyethyl Glycol-Propyl Glycol (SYSTANE FREE OP) Apply to eye.    psyllium (METAMUCIL) 58.6 % powder Take by mouth. 2 teaspoons nightly 07/09/2022: 3 teaspoons daily   RESTASIS 0.05 % ophthalmic emulsion Place 1 drop into both eyes 2 (two) times daily.    rosuvastatin (CRESTOR) 5 MG tablet TAKE 1 TABLET(5 MG) BY MOUTH EVERY OTHER DAY    traMADol (ULTRAM) 50 MG tablet Take 50 mg by mouth every 6 (six) hours as needed. 07/09/2022: Took one at 4am, old rx   valACYclovir (VALTREX) 500 MG tablet Take 1 tablet (500 mg total) by mouth daily.    ZINC-VITAMIN C PO Take 1 tablet by mouth daily in the afternoon.    [DISCONTINUED] famotidine (  PEPCID) 40 MG tablet One tablet at bedtime (Patient taking differently: Take 40 mg by mouth at bedtime. One tablet at bedtime)    ALPRAZolam (XANAX) 0.25 MG tablet Take 1 tablet (0.25 mg total) by mouth at bedtime as needed for anxiety. PRN (Patient not taking: Reported on 07/09/2022) 07/09/2022: As needed   ammonium lactate (AMLACTIN) 12 % lotion Apply 1 Application topically as needed for dry skin. (Patient not taking: Reported on 07/09/2022) 07/09/2022: As needed   gabapentin (NEURONTIN) 100 MG capsule 3 (three) times daily. (Patient not taking: Reported on 07/09/2022) 07/09/2022: Uses prn--took 300mg  on 6/21   sucralfate (CARAFATE) 1 g tablet TAKE 1 TABLET(1 GRAM) BY MOUTH FOUR TIMES DAILY AT BEDTIME WITH  MEALS (Patient not taking: Reported on 07/09/2022) 07/09/2022: As needed   No facility-administered encounter medications on file as of 07/09/2022.   Allergies  Allergen Reactions   Cephalosporins Diarrhea   Codeine Itching   Latex Itching   Pravastatin     Myalgias   Simvastatin     myalgias   Sulfa Antibiotics Diarrhea   ROS: no headaches, dizziness, syncope.  No chest pain, palpitations. Occasionally heart may race a little.  No hearing/vision problems.  No n/v/d. No urinary or fecal incontinence.  See HPI.   PHYSICAL EXAM:  BP 128/78   Pulse 72   Ht 5' 6.5" (1.689 m)   Wt 219 lb (99.3 kg)   LMP 12/31/2019   BMI 34.82 kg/m   Wt Readings from Last 3 Encounters:  07/09/22 219 lb (99.3 kg)  06/04/22 219 lb 3.2 oz (99.4 kg)  02/13/22 213 lb (96.6 kg)   Well-appearing, pleasant, obese female in no acute distress HEENT: conjunctiva and sclera are clear, EOMI. Neck: no lymphadenopathy, thyromegaly or carotid bruit. No spinal tenderness Heart: regular rate and rhythm Lungs: clear bilaterally Back: No spinal tenderness. Mildly tender at L SI joint and surrounding area. No pyriformis spasm Neuro: alert and oriented, cranial nerves grossly intact.  Normal strength, sensation. DTR's symmetric. Negative Phalen, +tinel on the left. Extremities: no edema.  Arthritic changes to her fingers bilaterally   ASSESSMENT/PLAN:  Chronic low back pain, unspecified back pain laterality, unspecified whether sciatica present - flaring some across low back recently. Restart gabapentin. Consider f/u with neurosurgeon or for repeat injection - Plan: gabapentin (NEURONTIN) 100 MG capsule  Neuropathy - in UE may be due to CTS.  LE was extensive and bilateral, r/b 300mg  of gabapentin. Resuming gabapentin for LBP. EMG/NCV is scheduled - Plan: gabapentin (NEURONTIN) 100 MG capsule  Risks/SE of gabapentin reviewed.  I spent 40 minutes dedicated to the care of this patient, including pre-visit  review of records, face to face time, post-visit ordering of testing and documentation.   Your exam is fairly normal today (no indication of any dramatic weakness or change in neurologic status.)  Ultimately, you may want to follow up with neurosurgery regarding your back pain (not necessarily for surgery, but possibly another injection if still painful).  I would re-try the gabapentin. I'm not sure what your directions were for the 100mg . Some people take 1 pill three times daily (and for others, that may be too sedating). Since you had a great response to one dose (of 3 pills) one day last week, let's start with 2 capsules (200 mg) at bedtime.  After a few days, if you are having pain or bothersome tingling, you can either increasing the nighttime dose to 3 capsules, or add 100 mg in the morning (if that doesn't  make you too sleepy).  Hopefully this will get you through the next few weeks.  If you have acutely worsening pain, fever, chills, weakness, loss of bowel or bladder function, these could all indicate something more serious, and need more urgent re-evaluatoin.

## 2022-07-09 NOTE — Patient Instructions (Signed)
Your exam is fairly normal today (no indication of any dramatic weakness or change in neurologic status.)  Ultimately, you may want to follow up with neurosurgery regarding your back pain (not necessarily for surgery, but possibly another injection if still painful).  I would re-try the gabapentin. I'm not sure what your directions were for the 100mg . Some people take 1 pill three times daily (and for others, that may be too sedating). Since you had a great response to one dose (of 3 pills) one day last week, let's start with 2 capsules (200 mg) at bedtime.  After a few days, if you are having pain or bothersome tingling, you can either increasing the nighttime dose to 3 capsules, or add 100 mg in the morning (if that doesn't make you too sleepy).  Hopefully this will get you through the next few weeks.  If you have acutely worsening pain, fever, chills, weakness, loss of bowel or bladder function, these could all indicate something more serious, and need more urgent re-evaluatoin.

## 2022-07-25 ENCOUNTER — Other Ambulatory Visit: Payer: Self-pay | Admitting: Medical

## 2022-07-26 DIAGNOSIS — M4310 Spondylolisthesis, site unspecified: Secondary | ICD-10-CM | POA: Diagnosis not present

## 2022-08-01 DIAGNOSIS — G5603 Carpal tunnel syndrome, bilateral upper limbs: Secondary | ICD-10-CM | POA: Diagnosis not present

## 2022-08-05 ENCOUNTER — Other Ambulatory Visit: Payer: Self-pay | Admitting: Family Medicine

## 2022-08-05 DIAGNOSIS — M545 Low back pain, unspecified: Secondary | ICD-10-CM

## 2022-08-05 DIAGNOSIS — G629 Polyneuropathy, unspecified: Secondary | ICD-10-CM

## 2022-08-13 ENCOUNTER — Telehealth: Payer: Self-pay | Admitting: Medical

## 2022-08-13 NOTE — Telephone Encounter (Signed)
Pt called and she is waiting on her Pt Assistance Trulance,  requested samples, #4 given to hold her

## 2022-08-17 DIAGNOSIS — M5416 Radiculopathy, lumbar region: Secondary | ICD-10-CM | POA: Diagnosis not present

## 2022-08-20 ENCOUNTER — Other Ambulatory Visit: Payer: Self-pay | Admitting: Medical

## 2022-08-20 NOTE — Telephone Encounter (Signed)
Pt was rx this for #90 with 3 refills

## 2022-09-06 ENCOUNTER — Other Ambulatory Visit: Payer: Self-pay | Admitting: Medical

## 2022-09-06 DIAGNOSIS — G629 Polyneuropathy, unspecified: Secondary | ICD-10-CM

## 2022-09-06 DIAGNOSIS — M545 Low back pain, unspecified: Secondary | ICD-10-CM

## 2022-09-06 NOTE — Telephone Encounter (Signed)
Pt has an appt in November 

## 2022-09-11 ENCOUNTER — Telehealth: Payer: Self-pay | Admitting: Medical

## 2022-09-11 DIAGNOSIS — D0472 Carcinoma in situ of skin of left lower limb, including hip: Secondary | ICD-10-CM | POA: Diagnosis not present

## 2022-09-11 DIAGNOSIS — L570 Actinic keratosis: Secondary | ICD-10-CM | POA: Diagnosis not present

## 2022-09-11 NOTE — Telephone Encounter (Signed)
Fax refill request [facility]  Rosuvastatin  Meloxicam  Ezetimibe

## 2022-09-12 ENCOUNTER — Telehealth: Payer: Self-pay

## 2022-09-12 ENCOUNTER — Other Ambulatory Visit: Payer: Self-pay | Admitting: Medical

## 2022-09-12 MED ORDER — EZETIMIBE 10 MG PO TABS: ORAL_TABLET | ORAL | 1 refills | Status: DC

## 2022-09-12 MED ORDER — VALACYCLOVIR HCL 500 MG PO TABS: 500.0000 mg | ORAL_TABLET | Freq: Every day | ORAL | 0 refills | Status: DC

## 2022-09-12 MED ORDER — ROSUVASTATIN CALCIUM 5 MG PO TABS
ORAL_TABLET | ORAL | 1 refills | Status: DC
Start: 2022-09-12 — End: 2023-09-18
  Filled 2023-02-09: qty 80, 80d supply, fill #0
  Filled 2023-02-11: qty 45, 90d supply, fill #0

## 2022-09-12 MED ORDER — DULOXETINE HCL 60 MG PO CPEP: 60.0000 mg | ORAL_CAPSULE | Freq: Two times a day (BID) | ORAL | 0 refills | Status: DC

## 2022-09-12 MED ORDER — MELOXICAM 15 MG PO TABS: 15.0000 mg | ORAL_TABLET | Freq: Every day | ORAL | 0 refills | Status: DC

## 2022-09-12 NOTE — Telephone Encounter (Signed)
Request faxed in for duloxetine EC/DR cap and valacyclovir tab

## 2022-09-12 NOTE — Telephone Encounter (Signed)
Sent!

## 2022-09-18 ENCOUNTER — Ambulatory Visit (INDEPENDENT_AMBULATORY_CARE_PROVIDER_SITE_OTHER): Payer: Medicare Other | Admitting: Medical

## 2022-09-18 VITALS — BP 120/70 | HR 64 | Temp 97.5°F | Wt 220.6 lb

## 2022-09-18 DIAGNOSIS — J0110 Acute frontal sinusitis, unspecified: Secondary | ICD-10-CM | POA: Diagnosis not present

## 2022-09-18 MED ORDER — AMOXICILLIN-POT CLAVULANATE 875-125 MG PO TABS: 1.0000 | ORAL_TABLET | Freq: Two times a day (BID) | ORAL | 0 refills | Status: DC

## 2022-09-18 NOTE — Progress Notes (Signed)
Subjective:  Kristi Avila is a 67 y.o. female who presents for Chief Complaint  Patient presents with   Sinus Problem    Sinus problem x 4 days, drainage, ear pain, shooting pains in head, coughing, no fever, chills or body aches. Just got back from a cruise over a week ago. Tested 3 times for covid and negative     Here for illness, possible sinus infection.  She notes that got back from cruise trip a week ago.  Has done 3 recent covid tests, all negative. Has sinus pressure, swollen glands in front of neck, yellow green productive mucous.  Has had sinus/facial pain, ears with some discomfort.  Headaches daily for past week.  Has fair amount of cough.    No SOB or wheezing.  No fever.  No NVD.   Has some sore throat.     Not using any OTC medication currently for symptoms ,but did try some sudafed few days ago, afrin nasal one day recently.  May want flu shot today  No other aggravating or relieving factors.    No other c/o.  Past Medical History:  Diagnosis Date   Arthritis    Barrett's esophagus with esophagitis 07/23/2012   EGD: CLO test results negative. pathology: Barretss: Repeat EGD 3 years   Blood transfusion without reported diagnosis    remote past   Cancer (HCC)    squamous cell CA- on legs   Depression    Family history of anesthesia complication    mother trouble waking up   Frequency of urination    GERD (gastroesophageal reflux disease)    h/o peptic ulcer   High cholesterol    HSV-2 infection    anal   Nocturia    Obesity    Overactive bladder    Reflux    Urgency of urination    UTI (urinary tract infection)    Current Outpatient Medications on File Prior to Visit  Medication Sig Dispense Refill   ALPRAZolam (XANAX) 0.25 MG tablet Take 1 tablet (0.25 mg total) by mouth at bedtime as needed for anxiety. PRN 20 tablet 0   ammonium lactate (AMLACTIN) 12 % lotion Apply 1 Application topically as needed for dry skin. 400 g 0   dexlansoprazole (DEXILANT)  60 MG capsule TAKE 1 CAPSULE(60 MG) BY MOUTH DAILY 90 capsule 3   DULoxetine (CYMBALTA) 60 MG capsule Take 1 capsule (60 mg total) by mouth 2 (two) times daily. 180 capsule 0   ezetimibe (ZETIA) 10 MG tablet TAKE 1 TABLET(10 MG) BY MOUTH DAILY 90 tablet 1   famotidine (PEPCID) 20 MG tablet Take 40 mg by mouth at bedtime.     gabapentin (NEURONTIN) 100 MG capsule TAKE UP TO 3 CAPSULES BY MOUTH DAILY AS DIRECTED 90 capsule 0   meloxicam (MOBIC) 15 MG tablet Take 1 tablet (15 mg total) by mouth daily. TAKE 1 TABLET(15 MG) BY MOUTH DAILY 90 tablet 0   MULTIPLE VITAMIN PO Take 1 tablet by mouth daily.     Plecanatide (TRULANCE) 3 MG TABS Take 1 tablet (3 mg total) by mouth daily. 12 tablet 0   Polyethyl Glycol-Propyl Glycol (SYSTANE FREE OP) Apply to eye.     psyllium (METAMUCIL) 58.6 % powder Take by mouth. 2 teaspoons nightly     RESTASIS 0.05 % ophthalmic emulsion Place 1 drop into both eyes 2 (two) times daily.     rosuvastatin (CRESTOR) 5 MG tablet TAKE 1 TABLET(5 MG) BY MOUTH EVERY OTHER DAY 90 tablet  1   valACYclovir (VALTREX) 500 MG tablet Take 1 tablet (500 mg total) by mouth daily. 90 tablet 0   ZINC-VITAMIN C PO Take 1 tablet by mouth daily in the afternoon.     No current facility-administered medications on file prior to visit.     The following portions of the patient's history were reviewed and updated as appropriate: allergies, current medications, past family history, past medical history, past social history, past surgical history and problem list.  ROS Otherwise as in subjective above    Objective: BP 120/70   Pulse 64   Temp (!) 97.5 F (36.4 C)   Wt 220 lb 9.6 oz (100.1 kg)   LMP 12/31/2019   BMI 35.07 kg/m   General appearance: alert, no distress, well developed, well nourished HEENT: normocephalic, sclerae anicteric, conjunctiva pink and moist, TMs flat with some air fluid levels, nares patent,no discharge, but +erythema, pharynx normal Oral cavity: MMM, no  lesions Neck: supple, no lymphadenopathy, no thyromegaly, no masses Heart: RRR, normal S1, S2, no murmurs Lungs: CTA bilaterally, no wheezes, rhonchi, or rales    Assessment: Encounter Diagnosis  Name Primary?   Acute non-recurrent frontal sinusitis Yes     Plan: Discussed symptoms and concerns.   Consider 4-5 days of either mucinex or Sudafed. Begin antibiotic below.   Can use delsym for cough.   Continue good water intake.  If worse or not improving later this week, let me know   Gai was seen today for sinus problem.  Diagnoses and all orders for this visit:  Acute non-recurrent frontal sinusitis  Other orders -     amoxicillin-clavulanate (AUGMENTIN) 875-125 MG tablet; Take 1 tablet by mouth 2 (two) times daily.   Follow up: prn

## 2022-09-21 ENCOUNTER — Other Ambulatory Visit: Payer: Self-pay | Admitting: Medical

## 2022-09-21 MED ORDER — AZITHROMYCIN 250 MG PO TABS: ORAL_TABLET | ORAL | 0 refills | Status: DC

## 2022-09-28 ENCOUNTER — Telehealth: Payer: Self-pay | Admitting: Medical

## 2022-09-28 NOTE — Telephone Encounter (Signed)
Fax from [facility]  Duloxetine EC/DR cap  [employer]

## 2022-10-02 ENCOUNTER — Telehealth: Payer: Self-pay

## 2022-10-02 MED ORDER — DULOXETINE HCL 60 MG PO CPEP: 60.0000 mg | ORAL_CAPSULE | Freq: Two times a day (BID) | ORAL | 0 refills | Status: DC

## 2022-10-02 NOTE — Telephone Encounter (Signed)
done

## 2022-10-02 NOTE — Telephone Encounter (Signed)
Fax requesting duloxetine EC. Last appt. 06/04/22. Next appt. 11/27/22. Pt did not pick up fill for rx on 09/12/22, wants rx to go through [facility]

## 2022-10-12 DIAGNOSIS — M79672 Pain in left foot: Secondary | ICD-10-CM | POA: Diagnosis not present

## 2022-10-15 ENCOUNTER — Other Ambulatory Visit: Payer: Self-pay | Admitting: Medical

## 2022-10-15 DIAGNOSIS — G8929 Other chronic pain: Secondary | ICD-10-CM

## 2022-10-15 DIAGNOSIS — G629 Polyneuropathy, unspecified: Secondary | ICD-10-CM

## 2022-10-20 ENCOUNTER — Other Ambulatory Visit: Payer: Self-pay | Admitting: Medical

## 2022-10-25 DIAGNOSIS — M7742 Metatarsalgia, left foot: Secondary | ICD-10-CM | POA: Diagnosis not present

## 2022-10-30 DIAGNOSIS — D485 Neoplasm of uncertain behavior of skin: Secondary | ICD-10-CM | POA: Diagnosis not present

## 2022-10-30 DIAGNOSIS — C44629 Squamous cell carcinoma of skin of left upper limb, including shoulder: Secondary | ICD-10-CM | POA: Diagnosis not present

## 2022-10-30 DIAGNOSIS — L570 Actinic keratosis: Secondary | ICD-10-CM | POA: Diagnosis not present

## 2022-11-07 DIAGNOSIS — M79672 Pain in left foot: Secondary | ICD-10-CM | POA: Diagnosis not present

## 2022-11-12 DIAGNOSIS — M7742 Metatarsalgia, left foot: Secondary | ICD-10-CM | POA: Diagnosis not present

## 2022-11-14 DIAGNOSIS — L905 Scar conditions and fibrosis of skin: Secondary | ICD-10-CM | POA: Diagnosis not present

## 2022-11-14 DIAGNOSIS — L820 Inflamed seborrheic keratosis: Secondary | ICD-10-CM | POA: Diagnosis not present

## 2022-11-14 DIAGNOSIS — L814 Other melanin hyperpigmentation: Secondary | ICD-10-CM | POA: Diagnosis not present

## 2022-11-14 DIAGNOSIS — L821 Other seborrheic keratosis: Secondary | ICD-10-CM | POA: Diagnosis not present

## 2022-11-14 DIAGNOSIS — Z85828 Personal history of other malignant neoplasm of skin: Secondary | ICD-10-CM | POA: Diagnosis not present

## 2022-11-14 DIAGNOSIS — L565 Disseminated superficial actinic porokeratosis (DSAP): Secondary | ICD-10-CM | POA: Diagnosis not present

## 2022-11-14 DIAGNOSIS — L570 Actinic keratosis: Secondary | ICD-10-CM | POA: Diagnosis not present

## 2022-11-15 ENCOUNTER — Other Ambulatory Visit: Payer: Self-pay | Admitting: Medical

## 2022-11-16 ENCOUNTER — Other Ambulatory Visit: Payer: Self-pay | Admitting: Medical

## 2022-11-16 DIAGNOSIS — G629 Polyneuropathy, unspecified: Secondary | ICD-10-CM

## 2022-11-16 DIAGNOSIS — G8929 Other chronic pain: Secondary | ICD-10-CM

## 2022-11-26 ENCOUNTER — Other Ambulatory Visit: Payer: Self-pay | Admitting: Medical

## 2022-11-27 ENCOUNTER — Ambulatory Visit (INDEPENDENT_AMBULATORY_CARE_PROVIDER_SITE_OTHER): Payer: Medicare Other | Admitting: Medical

## 2022-11-27 VITALS — BP 118/76 | HR 79 | Ht 66.0 in | Wt 226.4 lb

## 2022-11-27 DIAGNOSIS — R6889 Other general symptoms and signs: Secondary | ICD-10-CM | POA: Diagnosis not present

## 2022-11-27 DIAGNOSIS — Z1211 Encounter for screening for malignant neoplasm of colon: Secondary | ICD-10-CM

## 2022-11-27 DIAGNOSIS — Z131 Encounter for screening for diabetes mellitus: Secondary | ICD-10-CM

## 2022-11-27 DIAGNOSIS — R296 Repeated falls: Secondary | ICD-10-CM | POA: Insufficient documentation

## 2022-11-27 DIAGNOSIS — Z0000 Encounter for general adult medical examination without abnormal findings: Secondary | ICD-10-CM | POA: Diagnosis not present

## 2022-11-27 DIAGNOSIS — R2689 Other abnormalities of gait and mobility: Secondary | ICD-10-CM | POA: Diagnosis not present

## 2022-11-27 DIAGNOSIS — Z780 Asymptomatic menopausal state: Secondary | ICD-10-CM

## 2022-11-27 DIAGNOSIS — E785 Hyperlipidemia, unspecified: Secondary | ICD-10-CM | POA: Diagnosis not present

## 2022-11-27 DIAGNOSIS — Z974 Presence of external hearing-aid: Secondary | ICD-10-CM

## 2022-11-27 DIAGNOSIS — Z136 Encounter for screening for cardiovascular disorders: Secondary | ICD-10-CM | POA: Diagnosis not present

## 2022-11-27 DIAGNOSIS — F5104 Psychophysiologic insomnia: Secondary | ICD-10-CM

## 2022-11-27 DIAGNOSIS — R413 Other amnesia: Secondary | ICD-10-CM | POA: Diagnosis not present

## 2022-11-27 DIAGNOSIS — H938X3 Other specified disorders of ear, bilateral: Secondary | ICD-10-CM

## 2022-11-27 DIAGNOSIS — Z7185 Encounter for immunization safety counseling: Secondary | ICD-10-CM | POA: Diagnosis not present

## 2022-11-27 DIAGNOSIS — M150 Primary generalized (osteo)arthritis: Secondary | ICD-10-CM | POA: Diagnosis not present

## 2022-11-27 NOTE — Patient Instructions (Signed)
This visit was a preventative care visit, also known as wellness visit or routine physical.   Topics typically include healthy lifestyle, diet, exercise, preventative care, vaccinations, sick and well care, proper use of emergency dept and after hours care, as well as other concerns.     Recommendations: Continue to return yearly for your annual wellness and preventative care visits.  This gives [place] a chance to discuss healthy lifestyle, exercise, vaccinations, review your chart record, and perform screenings where appropriate.  I recommend you see your eye doctor yearly for routine vision care.  I recommend you see your dentist yearly for routine dental care including hygiene visits twice yearly.   Vaccination recommendations were reviewed Immunization History  Administered Date(s) Administered   Influenza Inj Mdck Quad Pf 09/18/2017   Influenza Split 10/03/2021   Influenza Whole 10/28/2012   Influenza,inj,Quad PF,6+ Mos 10/12/2015, 10/10/2016, 09/08/2018   Influenza,inj,quad, With Preservative 10/15/2016   Influenza-Unspecified 11/29/2013, 10/26/2014, 10/15/2019, 10/13/2020, 10/15/2022   PFIZER Comirnaty(Gray Top)Covid-19 Tri-Sucrose Vaccine 05/04/2020   PFIZER(Purple Top)SARS-COV-2 Vaccination 02/23/2019, 03/20/2019, 10/15/2019   PNEUMOCOCCAL CONJUGATE-20 01/10/2021   Pfizer(Comirnaty)Fall Seasonal Vaccine 12 years and older 10/03/2021, 10/08/2022   Rsv, Bivalent, Protein Subunit Rsvpref,pf Verdis Frederickson) 08/30/2021   Tdap 06/04/2011, 10/23/2019, 09/06/2021   Zoster Recombinant(Shingrix) 11/28/2018, 01/28/2019   Zoster, Live 11/29/2013   You are up to date on the following vaccines:   Screening for cancer: Colon cancer screening: Reviewed 2017 colonoscopy report. Repeat in 2027.  Stool hemoccult negative in 2021.  Repeat stool FIT test this year.  Breast cancer screening: You should perform a self breast exam monthly.   We reviewed recommendations for regular mammograms and  breast cancer screening. Mammogram 01/2022 reviewed  Cervical cancer screening: We reviewed recommendations for pap smear screening. Will request 2024 pap from Dr. Jorene Minors office  Skin cancer screening: Check your skin regularly for new changes, growing lesions, or other lesions of concern Come in for evaluation if you have skin lesions of concern.  Lung cancer screening: If you have a greater than 20 pack year history of tobacco use, then you may qualify for lung cancer screening with a chest CT scan.   Please call your insurance company to inquire about coverage for this test.  We currently don't have screenings for other cancers besides breast, cervical, colon, and lung cancers.  If you have a strong family history of cancer or have other cancer screening concerns, please let me know.    Bone health: Get at least 150 minutes of aerobic exercise weekly Get weight bearing exercise at least once weekly Bone density test:  A bone density test is an imaging test that uses a type of X-ray to measure the amount of calcium and other minerals in your bones. The test may be used to diagnose or screen you for a condition that causes weak or thin bones (osteoporosis), predict your risk for a broken bone (fracture), or determine how well your osteoporosis treatment is working. The bone density test is recommended for females 65 and older, or females or males <65 if certain risk factors such as thyroid disease, long term use of steroids such as for asthma or rheumatological issues, vitamin D deficiency, estrogen deficiency, family history of osteoporosis, self or family history of fragility fracture in first degree relative.  Bone density test normal 2022, repeat in 2027   Heart health: Get at least 150 minutes of aerobic exercise weekly Limit alcohol It is important to maintain a healthy blood pressure and healthy cholesterol numbers  Heart disease screening: Screening for heart disease  includes screening for blood pressure, fasting lipids, glucose/diabetes screening, BMI height to weight ratio, reviewed of smoking status, physical activity, and diet.    Goals include blood pressure 120/80 or less, maintaining a healthy lipid/cholesterol profile, preventing diabetes or keeping diabetes numbers under good control, not smoking or using tobacco products, exercising most days per week or at least 150 minutes per week of exercise, and eating healthy variety of fruits and vegetables, healthy oils, and avoiding unhealthy food choices like fried food, fast food, high sugar and high cholesterol foods.    Other tests may possibly include EKG test, CT coronary calcium score, echocardiogram, exercise treadmill stress test.   Consider CT coronary test, $95 cash test   Medical care options: I recommend you continue to seek care here first for routine care.  We try really hard to have available appointments Monday through Friday daytime hours for sick visits, acute visits, and physicals.  Urgent care should be used for after hours and weekends for significant issues that cannot wait till the next day.  The emergency department should be used for significant potentially life-threatening emergencies.  The emergency department is expensive, can often have long wait times for less significant concerns, so try to utilize primary care, urgent care, or telemedicine when possible to avoid unnecessary trips to the emergency department.  Virtual visits and telemedicine have been introduced since the pandemic started in 2020, and can be convenient ways to receive medical care.  We offer virtual appointments as well to assist you in a variety of options to seek medical care.   Advanced Directives: I recommend you consider completing a Health Care Power of Attorney and Living Will.   These documents respect your wishes and help alleviate burdens on your loved ones if you were to become terminally ill or be in a  position to need those documents enforced.    You can complete Advanced Directives yourself, have them notarized, then have copies made for our office, for you and for anybody you feel should have them in safe keeping.  Or, you can have an attorney prepare these documents.   If you haven't updated your Last Will and Testament in a while, it may be worthwhile having an attorney prepare these documents together and save on some costs.       Significant issues: Memory concern - return for labs. We can consider neurology consult  Falls, balance issues - referral to physical therapy  Exercise intolerance - EKG ok today.  Consider other evaluation such as CT heart test or other  Uses hearing aids    Medicare Attestation A preventative services visit was completed today.  During the course of the visit the patient was educated and counseled about appropriate screening and preventive services.  A health risk assessment was established with the patient that included a review of current medications, allergies, social history, family history, medical and preventative health history, biometrics, and preventative screenings to identify potential safety concerns or impairments.  A personalized plan was printed today for the patient's records and use.   Personalized health advice and education was given today to reduce health risks and promote self management and wellness.  Information regarding end of life planning was discussed today.  Kristian Covey, PA-C   11/27/2022

## 2022-11-27 NOTE — Progress Notes (Signed)
Subjective:    Kristi Avila is a 67 y.o. female who presents for Preventative Services visit and chronic medical problems/med check visit.    Primary Care Provider Tysinger, Kermit Balo, PA-C here for primary care  Current Health Care Team: Dentist, Dr. Teola Bradley GI- Dr. Nelson Chimes GYN- Dr. Billy Coast Derm-Dr. Karlyn Agee Ortho-Dr. Merlyn Lot, Dr. Despina Hick Dr. Rob Bunting, GI Neurosurg-Dr. [facility] doctor, Dr. Rushie Nyhan Eye  Medical Services you may have received from other than [facility] providers in the past year (date may be approximate) Dr. Merlyn Lot, neurology, ortho  Exercise Current exercise habits: The patient does not participate in regular exercise at present. Some walking 30 minutes 2-3 time per week, active around the house.  Nutrition/Diet Current diet:  Weight Watcher diet  Depression Screen    11/27/2022    1:29 PM  Depression screen PHQ 2/9  Decreased Interest 0  Down, Depressed, Hopeless 0  PHQ - 2 Score 0    Activities of Daily Living Screen/Functional Status Survey Is the patient deaf or have difficulty hearing?: Yes (wears B/L hearing aides) Does the patient have difficulty seeing, even when wearing glasses/contacts?: No Does the patient have difficulty concentrating, remembering, or making decisions?: Yes (memory issues, age  related) Does the patient have difficulty walking or climbing stairs?: No Does the patient have difficulty dressing or bathing?: No Does the patient have difficulty doing errands alone such as visiting a doctor's office or shopping?: No  Can patient draw a clock face showing 3:15 oclock, yes  Fall Risk Screen    11/27/2022    1:29 PM 06/04/2022    3:40 PM 11/23/2021   12:10 PM 11/15/2020    8:31 AM 09/08/2020   12:47 PM  Fall Risk   Falls in the past year? 0 0 0 0 0  Number falls in past yr: 0 0 0 0 0  Injury with Fall? 0 0 0 0 0  Risk for fall due to : No Fall Risks No Fall Risks No Fall Risks No Fall Risks No Fall Risks  Follow up Falls  evaluation completed Falls evaluation completed Falls evaluation completed Falls evaluation completed Falls evaluation completed    Gait Assessment: Normal gait observed yes  Advanced directives Does patient have a Health Care Power of Attorney? No Does patient have a Living Will? No  Past Medical History:  Diagnosis Date   Arthritis    Barrett's esophagus with esophagitis 07/23/2012   EGD: CLO test results negative. pathology: Barretss: Repeat EGD 3 years   Blood transfusion without reported diagnosis    remote past   Cancer (HCC)    squamous cell CA- on legs   Depression    Family history of anesthesia complication    mother trouble waking up   Frequency of urination    GERD (gastroesophageal reflux disease)    h/o peptic ulcer   High cholesterol    HSV-2 infection    anal   Nocturia    Obesity    Overactive bladder    Reflux    Urgency of urination    UTI (urinary tract infection)     Past Surgical History:  Procedure Laterality Date   ARTHROSCOPY KNEE W/ DRILLING     X4 ON RT KNEE   CARPAL TUNNEL RELEASE Right 2000   COLONOSCOPY  2017   repeat 2027   colonscopy  2007, 2017   ESOPHAGOGASTRODUODENOSCOPY  2007   Dr. Noe Gens, [facility]   foot fibroma surgery  all before 1985  x 3   HEMORRHOID SURGERY  04/2006   HIP FRACTURE SURGERY Right 2018   KNEE ARTHROSCOPY     X2 LEFT KNEE   LIPOMA EXCISION  11/2020   SINUS EXPLORATION  1995   TOE AMPUTATION  1959   TOTAL HIP ARTHROPLASTY Left 2022   TOTAL KNEE ARTHROPLASTY Right 09/22/2012   Procedure: RIGHT TOTAL KNEE ARTHROPLASTY;  Surgeon: Loanne Drilling, MD;  Location: [facility];  Service: Orthopedics;  Laterality: Right;   TOTAL KNEE ARTHROPLASTY Left 09/28/2013   Procedure: LEFT TOTAL KNEE ARTHROPLASTY, CORTISONE INJECTION RIGHT HIP;  Surgeon: Loanne Drilling, MD;  Location: [facility];  Service: Orthopedics;  Laterality: Left;   UPPER GASTROINTESTINAL ENDOSCOPY      Social History   Socioeconomic History    Marital status: Divorced    Spouse name: Not on file   Number of children: 0   Years of education: Not on file   Highest education level: Bachelor's degree (e.g., BA, AB, BS)  Occupational History   Occupation: Engineer, manufacturing systems [employer]  Tobacco Use   Smoking status: Never   Smokeless tobacco: Never  Vaping Use   Vaping status: Never Used  Substance and Sexual Activity   Alcohol use: Yes    Alcohol/week: 2.0 standard drinks of alcohol    Types: 2 Cans of beer per week    Comment: 2 drinks a week   Drug use: No   Sexual activity: Not Currently    Birth control/protection: Post-menopausal  Other Topics Concern   Not on file  Social History Narrative   Boyfriend lives with her.   Retired.  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  11/2022.     Social Determinants of Health   Financial Resource Strain: High Risk (11/27/2022)   Overall Financial Resource Strain (CARDIA)    Difficulty of Paying Living Expenses: Very hard  Food Insecurity: No Food Insecurity (11/27/2022)   Hunger Vital Sign    Worried About Running Out of Food in the Last Year: Never true    Ran Out of Food in the Last Year: Never true  Transportation Needs: No Transportation Needs (11/27/2022)   PRAPARE - Administrator, Civil Service (Medical): No    Lack of Transportation (Non-Medical): No  Physical Activity: Insufficiently Active (11/27/2022)   Exercise Vital Sign    Days of Exercise per Week: 2 days    Minutes of Exercise per Session: 30 min  Stress: Stress Concern Present (11/27/2022)   [employer] of Occupational Health - Occupational Stress Questionnaire    Feeling of Stress : Very much  Social Connections: Socially Integrated (11/27/2022)   Social Connection and Isolation Panel [NHANES]    Frequency of Communication with Friends and Family: More than three times a week    Frequency of  Social Gatherings with Friends and Family: Once a week    Attends Religious Services: 1 to 4 times per year    Active Member of [employer] or Organizations: Yes    Attends Banker Meetings: 1 to 4 times per year    Marital Status: Living with partner  Intimate Partner Violence: Not on file    Family History  Problem Relation Age of Onset   Arthritis Mother    Pulmonary fibrosis Father        pulmonary fibrosis   Pulmonary disease Father    Cancer Sister  pancreatic   Diabetes Brother    Colon cancer Neg Hx    Esophageal cancer Neg Hx    Stomach cancer Neg Hx    Rectal cancer Neg Hx      Current Outpatient Medications:    dexlansoprazole (DEXILANT) 60 MG capsule, TAKE 1 CAPSULE(60 MG) BY MOUTH DAILY, Disp: 90 capsule, Rfl: 3   DULoxetine (CYMBALTA) 60 MG capsule, Take 1 capsule (60 mg total) by mouth 2 (two) times daily., Disp: 180 capsule, Rfl: 0   ezetimibe (ZETIA) 10 MG tablet, TAKE 1 TABLET(10 MG) BY MOUTH DAILY, Disp: 90 tablet, Rfl: 0   famotidine (PEPCID) 20 MG tablet, Take 40 mg by mouth at bedtime., Disp: , Rfl:    gabapentin (NEURONTIN) 100 MG capsule, TAKE UP TO 3 CAPSULES BY MOUTH DAILY AS DIRECTED, Disp: 90 capsule, Rfl: 0   meloxicam (MOBIC) 15 MG tablet, TAKE 1 TABLET(15 MG) BY MOUTH DAILY, Disp: 90 tablet, Rfl: 0   MULTIPLE VITAMIN PO, Take 1 tablet by mouth daily., Disp: , Rfl:    Plecanatide (TRULANCE) 3 MG TABS, Take 1 tablet (3 mg total) by mouth daily., Disp: 12 tablet, Rfl: 0   Polyethyl Glycol-Propyl Glycol (SYSTANE FREE OP), Apply to eye., Disp: , Rfl:    psyllium (METAMUCIL) 58.6 % powder, Take by mouth. 2 teaspoons nightly, Disp: , Rfl:    RESTASIS 0.05 % ophthalmic emulsion, Place 1 drop into both eyes 2 (two) times daily., Disp: , Rfl:    rosuvastatin (CRESTOR) 5 MG tablet, TAKE 1 TABLET(5 MG) BY MOUTH EVERY OTHER DAY, Disp: 90 tablet, Rfl: 1   valACYclovir (VALTREX) 500 MG tablet, Take 1 tablet (500 mg total) by mouth daily., Disp: 90  tablet, Rfl: 0   ZINC-VITAMIN C PO, Take 1 tablet by mouth daily in the afternoon., Disp: , Rfl:    ALPRAZolam (XANAX) 0.25 MG tablet, Take 1 tablet (0.25 mg total) by mouth at bedtime as needed for anxiety. PRN (Patient not taking: Reported on 11/27/2022), Disp: 20 tablet, Rfl: 0   ammonium lactate (AMLACTIN) 12 % lotion, Apply 1 Application topically as needed for dry skin. (Patient not taking: Reported on 11/27/2022), Disp: 400 g, Rfl: 0  Allergies  Allergen Reactions   Cephalosporins Diarrhea   Codeine Itching   Latex Itching   Pravastatin     Myalgias   Simvastatin     myalgias   Sulfa Antibiotics Diarrhea    History reviewed: allergies, current medications, past family history, past medical history, past social history, past surgical history and problem list  Chronic issues discussed: Hyperlipidemia-compliant with Crestor 5 mg every other day along with Zetia 10 mg daily  GERD-uses Dexilant regularly and Pepcid  Uses Trulance daily for constipation    Acute issues discussed: Concerns about memory. Sometimes forgetful.  No getting lost.  Concerns about balance.  Falls or comes close to falling often.  Objective:    Biometrics BP 118/76   Pulse 79   Ht 5\' 6"  (1.676 m)   Wt 226 lb 6.4 oz (102.7 kg)   LMP 12/31/2019   BMI 36.54 kg/m   Wt Readings from Last 3 Encounters:  11/27/22 226 lb 6.4 oz (102.7 kg)  09/18/22 220 lb 9.6 oz (100.1 kg)  07/09/22 219 lb (99.3 kg)    Cognitive Testing  Alert? Yes  Normal Appearance?Yes  Oriented to person? Yes  Place? Yes   Time? Yes  Recall of three objects?  Yes  Can perform simple calculations? Yes  Displays appropriate judgment?Yes  Can read the correct time from a watch face?Yes  General appearance: alert, no distress, WD/WN, white female Nutritional Status: Inadequate calore intake? no Loss of muscle mass? no Loss of fat beneath skin? no Localized or general edema? no Diminished functional status?  no  HEENT: normocephalic, sclerae anicteric, hearing aids present, TMs pearly, nares patent, no discharge or erythema, pharynx normal Oral cavity: MMM, no lesions Neck: supple, no lymphadenopathy, no thyromegaly, no masses, no bruits Heart: RRR, normal S1, S2, no murmurs Lungs: CTA bilaterally, no wheezes, rhonchi, or rales Abdomen: +bs, soft, non tender, non distended, no masses, no hepatomegaly, no splenomegaly Musculoskeletal: nontender, no swelling, no obvious deformity Extremities: no edema, no cyanosis, no clubbing Pulses: 2+ symmetric, upper and lower extremities, normal cap refill Neurological: alert, oriented x 3, CN2-12 intact, strength normal upper extremities and lower extremities, sensation normal throughout, DTRs 2+ throughout, no cerebellar signs, gait normal Psychiatric: normal affect, behavior normal, pleasant  Breast/GU-deferred to GYN   Assessment:   Encounter Diagnoses  Name Primary?   Encounter for health maintenance examination in adult Yes   Welcome to Medicare preventive visit    Falls    Balance problem    Screening for heart disease    Exercise intolerance    Sensation of fullness in both ears    Vaccine counseling    Screening for diabetes mellitus    Post-menopausal    Primary osteoarthritis involving multiple joints    Hyperlipidemia, unspecified hyperlipidemia type    Chronic insomnia    Does use hearing aid    Memory change    Screening for colon cancer      Plan:   This visit was a preventative care visit, also known as wellness visit or routine physical.   Topics typically include healthy lifestyle, diet, exercise, preventative care, vaccinations, sick and well care, proper use of emergency dept and after hours care, as well as other concerns.     Recommendations: Continue to return yearly for your annual wellness and preventative care visits.  This gives [place] a chance to discuss healthy lifestyle, exercise, vaccinations, review your chart  record, and perform screenings where appropriate.  I recommend you see your eye doctor yearly for routine vision care.  I recommend you see your dentist yearly for routine dental care including hygiene visits twice yearly.   Vaccination recommendations were reviewed Immunization History  Administered Date(s) Administered   Influenza Inj Mdck Quad Pf 09/18/2017   Influenza Split 10/03/2021   Influenza Whole 10/28/2012   Influenza,inj,Quad PF,6+ Mos 10/12/2015, 10/10/2016, 09/08/2018   Influenza,inj,quad, With Preservative 10/15/2016   Influenza-Unspecified 11/29/2013, 10/26/2014, 10/15/2019, 10/13/2020, 10/15/2022   PFIZER Comirnaty(Gray Top)Covid-19 Tri-Sucrose Vaccine 05/04/2020   PFIZER(Purple Top)SARS-COV-2 Vaccination 02/23/2019, 03/20/2019, 10/15/2019   PNEUMOCOCCAL CONJUGATE-20 01/10/2021   Pfizer(Comirnaty)Fall Seasonal Vaccine 12 years and older 10/03/2021, 10/08/2022   Rsv, Bivalent, Protein Subunit Rsvpref,pf Verdis Frederickson) 08/30/2021   Tdap 06/04/2011, 10/23/2019, 09/06/2021   Zoster Recombinant(Shingrix) 11/28/2018, 01/28/2019   Zoster, Live 11/29/2013   You are up to date on the following vaccines:   Screening for cancer: Colon cancer screening: Reviewed 2017 colonoscopy report. Repeat in 2027.  Stool hemoccult negative in 2021.  Repeat stool FIT test.  Breast cancer screening: You should perform a self breast exam monthly.   We reviewed recommendations for regular mammograms and breast cancer screening. Mammogram 01/2022 reviewed  Cervical cancer screening: We reviewed recommendations for pap smear screening. Will request 2024 pap from Dr. Jorene Minors office  Skin cancer screening: Check your skin regularly  for new changes, growing lesions, or other lesions of concern Come in for evaluation if you have skin lesions of concern.  Lung cancer screening: If you have a greater than 20 pack year history of tobacco use, then you may qualify for lung cancer screening with a  chest CT scan.   Please call your insurance company to inquire about coverage for this test.  We currently don't have screenings for other cancers besides breast, cervical, colon, and lung cancers.  If you have a strong family history of cancer or have other cancer screening concerns, please let me know.    Bone health: Get at least 150 minutes of aerobic exercise weekly Get weight bearing exercise at least once weekly Bone density test:  A bone density test is an imaging test that uses a type of X-ray to measure the amount of calcium and other minerals in your bones. The test may be used to diagnose or screen you for a condition that causes weak or thin bones (osteoporosis), predict your risk for a broken bone (fracture), or determine how well your osteoporosis treatment is working. The bone density test is recommended for females 65 and older, or females or males <65 if certain risk factors such as thyroid disease, long term use of steroids such as for asthma or rheumatological issues, vitamin D deficiency, estrogen deficiency, family history of osteoporosis, self or family history of fragility fracture in first degree relative.  Bone density test normal 2022, repeat in 2027   Heart health: Get at least 150 minutes of aerobic exercise weekly Limit alcohol It is important to maintain a healthy blood pressure and healthy cholesterol numbers  Heart disease screening: Screening for heart disease includes screening for blood pressure, fasting lipids, glucose/diabetes screening, BMI height to weight ratio, reviewed of smoking status, physical activity, and diet.    Goals include blood pressure 120/80 or less, maintaining a healthy lipid/cholesterol profile, preventing diabetes or keeping diabetes numbers under good control, not smoking or using tobacco products, exercising most days per week or at least 150 minutes per week of exercise, and eating healthy variety of fruits and vegetables, healthy  oils, and avoiding unhealthy food choices like fried food, fast food, high sugar and high cholesterol foods.    Other tests may possibly include EKG test, CT coronary calcium score, echocardiogram, exercise treadmill stress test.   Consider CT coronary test or referral to cardiology   Medical care options: I recommend you continue to seek care here first for routine care.  We try really hard to have available appointments Monday through Friday daytime hours for sick visits, acute visits, and physicals.  Urgent care should be used for after hours and weekends for significant issues that cannot wait till the next day.  The emergency department should be used for significant potentially life-threatening emergencies.  The emergency department is expensive, can often have long wait times for less significant concerns, so try to utilize primary care, urgent care, or telemedicine when possible to avoid unnecessary trips to the emergency department.  Virtual visits and telemedicine have been introduced since the pandemic started in 2020, and can be convenient ways to receive medical care.  We offer virtual appointments as well to assist you in a variety of options to seek medical care.   Advanced Directives: I recommend you consider completing a Health Care Power of Attorney and Living Will.   These documents respect your wishes and help alleviate burdens on your loved ones if you were to  become terminally ill or be in a position to need those documents enforced.    You can complete Advanced Directives yourself, have them notarized, then have copies made for our office, for you and for anybody you feel should have them in safe keeping.  Or, you can have an attorney prepare these documents.   If you haven't updated your Last Will and Testament in a while, it may be worthwhile having an attorney prepare these documents together and save on some costs.       Significant issues: Memory concern - return for  labs. We can consider neurology consult  Falls, balance issues - referral to physical therapy  Exercise intolerance - EKG ok today.  Consider other evaluation such as CT heart test or other  Uses hearing aids  Continue your other medicines as usual  Loreen was seen today for medicare wellness.  Diagnoses and all orders for this visit:  Encounter for health maintenance examination in adult -     Hemoglobin A1c; Future -     Lipid panel; Future -     Vitamin B12; Future -     TSH; Future -     RPR; Future -     Comprehensive metabolic panel; Future -     HIV Antibody (routine testing w rflx); Future  Welcome to Medicare preventive visit -     EKG 12-Lead  Falls -     Ambulatory referral to Physical Therapy  Balance problem -     Ambulatory referral to Physical Therapy  Screening for heart disease -     EKG 12-Lead  Exercise intolerance  Sensation of fullness in both ears  Vaccine counseling  Screening for diabetes mellitus  Post-menopausal  Primary osteoarthritis involving multiple joints  Hyperlipidemia, unspecified hyperlipidemia type -     Lipid panel; Future  Chronic insomnia  Does use hearing aid  Memory change -     Vitamin B12; Future -     TSH; Future -     RPR; Future -     Comprehensive metabolic panel; Future -     HIV Antibody (routine testing w rflx); Future  Screening for colon cancer -     Fecal occult blood, imunochemical([facility])     Medicare Attestation A preventative services visit was completed today.  During the course of the visit the patient was educated and counseled about appropriate screening and preventive services.  A health risk assessment was established with the patient that included a review of current medications, allergies, social history, family history, medical and preventative health history, biometrics, and preventative screenings to identify potential safety concerns or impairments.  A personalized  plan was printed today for the patient's records and use.   Personalized health advice and education was given today to reduce health risks and promote self management and wellness.  Information regarding end of life planning was discussed today.  Kristian Covey, PA-C   11/27/2022

## 2022-11-28 DIAGNOSIS — Z1211 Encounter for screening for malignant neoplasm of colon: Secondary | ICD-10-CM | POA: Diagnosis not present

## 2022-11-30 ENCOUNTER — Other Ambulatory Visit: Payer: Medicare Other

## 2022-11-30 DIAGNOSIS — R413 Other amnesia: Secondary | ICD-10-CM

## 2022-11-30 DIAGNOSIS — Z131 Encounter for screening for diabetes mellitus: Secondary | ICD-10-CM | POA: Diagnosis not present

## 2022-11-30 DIAGNOSIS — E785 Hyperlipidemia, unspecified: Secondary | ICD-10-CM | POA: Diagnosis not present

## 2022-11-30 DIAGNOSIS — Z0000 Encounter for general adult medical examination without abnormal findings: Secondary | ICD-10-CM | POA: Diagnosis not present

## 2022-11-30 DIAGNOSIS — Z79899 Other long term (current) drug therapy: Secondary | ICD-10-CM | POA: Diagnosis not present

## 2022-12-01 LAB — COMPREHENSIVE METABOLIC PANEL
ALT: 24 [IU]/L (ref 0–32)
AST: 21 IU/L (ref 0–40)
Albumin: 4.3 g/dL (ref 3.9–4.9)
Alkaline Phosphatase: 77 [IU]/L (ref 44–121)
BUN/Creatinine Ratio: 11 — ABNORMAL LOW (ref 12–28)
BUN: 11 mg/dL (ref 8–27)
Bilirubin Total: 0.3 mg/dL (ref 0.0–1.2)
CO2: 22 mmol/L (ref 20–29)
Calcium: 9.5 mg/dL (ref 8.7–10.3)
Chloride: 106 mmol/L (ref 96–106)
Creatinine, Ser: 0.99 mg/dL (ref 0.57–1.00)
Globulin, Total: 2.4 g/dL (ref 1.5–4.5)
Glucose: 98 mg/dL (ref 70–99)
Potassium: 4.5 mmol/L (ref 3.5–5.2)
Sodium: 142 mmol/L (ref 134–144)
Total Protein: 6.7 g/dL (ref 6.0–8.5)
eGFR: 63 mL/min/{1.73_m2} (ref >59)

## 2022-12-01 LAB — LIPID PANEL
Chol/HDL Ratio: 2.3 ratio (ref 0.0–4.4)
Cholesterol, Total: 149 mg/dL (ref 100–199)
HDL: 65 mg/dL (ref >39)
LDL Chol Calc (NIH): 67 mg/dL (ref 0–99)
Triglycerides: 94 mg/dL (ref 0–149)
VLDL Cholesterol Cal: 17 mg/dL (ref 5–40)

## 2022-12-01 LAB — HEMOGLOBIN A1C
Est. average glucose Bld gHb Est-mCnc: 114 mg/dL
Hgb A1c MFr Bld: 5.6 % (ref 4.8–5.6)

## 2022-12-01 LAB — TSH: TSH: 2.09 u[IU]/mL (ref 0.450–4.500)

## 2022-12-01 LAB — RPR: RPR Ser Ql: NONREACTIVE

## 2022-12-01 LAB — VITAMIN B12: Vitamin B-12: 1275 pg/mL — ABNORMAL HIGH (ref 232–1245)

## 2022-12-01 LAB — HIV ANTIBODY (ROUTINE TESTING W REFLEX): HIV Screen 4th Generation wRfx: NONREACTIVE

## 2022-12-03 ENCOUNTER — Other Ambulatory Visit: Payer: Self-pay | Admitting: Medical

## 2022-12-03 MED ORDER — DULOXETINE HCL 60 MG PO CPEP: 60.0000 mg | ORAL_CAPSULE | Freq: Two times a day (BID) | ORAL | 2 refills | Status: DC

## 2022-12-03 MED ORDER — VALACYCLOVIR HCL 500 MG PO TABS: 500.0000 mg | ORAL_TABLET | Freq: Every day | ORAL | 2 refills | Status: DC

## 2022-12-03 NOTE — Progress Notes (Signed)
Results sent through MyChart

## 2022-12-03 NOTE — Telephone Encounter (Signed)
PAP have been sent in

## 2022-12-04 ENCOUNTER — Other Ambulatory Visit: Payer: Self-pay | Admitting: Medical

## 2022-12-05 LAB — FECAL OCCULT BLOOD, IMMUNOCHEMICAL: Fecal Occult Bld: NEGATIVE

## 2022-12-06 NOTE — Progress Notes (Signed)
Results sent through MyChart

## 2022-12-07 ENCOUNTER — Encounter: Payer: Self-pay | Admitting: Internal Medicine

## 2022-12-17 ENCOUNTER — Telehealth: Payer: Self-pay | Admitting: Medical

## 2022-12-17 NOTE — Telephone Encounter (Signed)
PAP RENEWAL DEXILANT HELP AT HAND 2025 approved til 12/06/23

## 2022-12-18 DIAGNOSIS — L821 Other seborrheic keratosis: Secondary | ICD-10-CM | POA: Diagnosis not present

## 2022-12-18 DIAGNOSIS — Z85828 Personal history of other malignant neoplasm of skin: Secondary | ICD-10-CM | POA: Diagnosis not present

## 2022-12-18 DIAGNOSIS — L905 Scar conditions and fibrosis of skin: Secondary | ICD-10-CM | POA: Diagnosis not present

## 2022-12-18 DIAGNOSIS — L570 Actinic keratosis: Secondary | ICD-10-CM | POA: Diagnosis not present

## 2022-12-26 ENCOUNTER — Other Ambulatory Visit: Payer: Self-pay | Admitting: Medical

## 2023-01-23 DIAGNOSIS — G5603 Carpal tunnel syndrome, bilateral upper limbs: Secondary | ICD-10-CM | POA: Diagnosis not present

## 2023-01-23 DIAGNOSIS — M180 Bilateral primary osteoarthritis of first carpometacarpal joints: Secondary | ICD-10-CM | POA: Diagnosis not present

## 2023-01-23 NOTE — Progress Notes (Signed)
 Kristi Avila MRN: [id number] DOB: [birth year] (age: 68 y.o.)   Return Visit PCP:  Ronal Landry Fireman, PA-C Chief Complaint:  Chief Complaint  Patient presents with  . Left Hand - Numbness    Left hand numbness, s/p injection, patient wants another injection  . Right Hand - Numbness    Right hand numbness, s/p injection, patient wants another injection   Last Visit: 08/01/2022  History of Present Illness: Kristi Avila is being seen in follow-up regarding bilateral carpal tunnel syndrome.  She states the injections were helpful for a couple of months.  The symptoms have recurred.  She notes numbness and tingling in the fingers.  She has difficulty gripping things.  She has nocturnal symptoms.  She notes pain at the base of the thumbs at the [facility] joints.  He has noted a stiffness in the index fingers describes that it feels like it will trigger.  This is intermittent however.  Allergies:  Allergies  Allergen Reactions  . Cephalosporins Other (See Comments) and Diarrhea  . Codeine Other (See Comments) and Itching  . Latex Other (See Comments) and Itching  . Pravastatin  Other (See Comments)    Myalgias  . Simvastatin Other (See Comments)    myalgias  . Sulfa (Sulfonamide Antibiotics) Other (See Comments) and Diarrhea  . Latex, Natural Rubber Anxiety, Dermatitis and Itching    Past Medical History:  Past Medical History:  Diagnosis Date  . Arthritis   . GERD (gastroesophageal reflux disease)   . Hearing loss   . Hyperlipidemia   . Skin cancer     Past Surgical History:  Past Surgical History:  Procedure Laterality Date  . DILATION AND CURETTAGE OF UTERUS     Procedure: DILATION AND CURETTAGE OF UTERUS  . FOOT SURGERY     Procedure: FOOT SURGERY  . KNEE SURGERY     Procedure: KNEE SURGERY; 2 Times  . NEUROPLASTY / TRANSPOSITION MEDIAN NERVE AT CARPAL TUNNEL     Procedure: NEUROPLASTY / TRANSPOSITION MEDIAN NERVE AT CARPAL TUNNEL  . NOSE SURGERY     Procedure: NOSE SURGERY   . WISDOM TOOTH EXTRACTION     Procedure: WISDOM TOOTH EXTRACTION    Medications:  Current Outpatient Medications on File Prior to Visit  Medication Sig Dispense Refill  . cycloSPORINE (Restasis) 0.05 % ophthalmic emulsion Administer 1 drop into affected eye(s) 2 (two) times a day.    . dexlansoprazole  (Dexilant ) 60 mg DR capsule Dexilant  60 mg capsule, delayed release    . DULoxetine  (CYMBALTA ) 60 mg capsule Take 120 mg by mouth.    . DULoxetine  (CYMBALTA ) 60 mg capsule Take 1 capsule by mouth 2 (two) times a day.    . estradioL (ESTRACE) 0.01 % (0.1 mg/gram) vaginal cream     . ezetimibe  (ZETIA ) 10 mg tablet Take 10 mg by mouth.    . famotidine  (PEPCID ) 20 mg tablet famotidine  20 mg tablet    . fluticasone  propionate (FLONASE ) 50 mcg/spray nasal spray PLACE 1 SPRAY IN BOTH NOSTRILS DAILY    . gabapentin  (NEURONTIN ) 100 mg capsule     . meloxicam  (MOBIC ) 15 mg tablet Take 15 mg by mouth Once Daily.    . multivitamin (THERAGRAN) tab tablet Take  by mouth.    . plecanatide  (Trulance ) 3 mg tab Trulance  3 mg tablet TAKE 1 TABLET BY MOUTH DAILY    . valACYclovir  (VALTREX ) 500 mg tablet     . [DISCONTINUED] ALPRAZolam  (XANAX ) 0.25 mg tablet TAKE 1 TABLET BY MOUTH TWICE  DAILY AS NEEDED FOR ANXIETY    . [DISCONTINUED] cyclobenzaprine  (FLEXERIL ) 10 mg tablet Take 10 mg by mouth.    . [DISCONTINUED] dexlansoprazole  (DEXILANT ) 60 mg DR capsule Take 1 capsule by mouth Once Daily.    . [DISCONTINUED] diazePAM (VALIUM) 5 mg tablet Take 90 minutes prior to your procedure.    . [DISCONTINUED] diphenhydrAMINE  (BENADRYL ) 25 mg capsule Benadryl  25 mg capsule    . [DISCONTINUED] esomeprazole  (NexIUM ) 40 mg DR capsule     . [DISCONTINUED] fluconazole  (DIFLUCAN ) 150 mg tablet TAKE ONE TABLET BY MOUTH D1,4,7 AS NEEDED    . [DISCONTINUED] gabapentin  (NEURONTIN ) 100 mg capsule gabapentin  100 mg capsule 1-2 tablets po daily    . [DISCONTINUED] LORazepam (ATIVAN) 1 mg tablet Take 90 minutes prior to your  procedure    . [DISCONTINUED] loteprednol etabonate (Lotemax SM) 0.38 % drpg     . [DISCONTINUED] meloxicam  (MOBIC ) 15 mg tablet Take 15 mg by mouth.    . [DISCONTINUED] methocarbamoL  (ROBAXIN ) 500 mg tablet     . [DISCONTINUED] methylPREDNISolone  (MEDROL  DOSEPAK) 4 mg 6 day dose pack Take As Directed On Package 21 tablet 0  . [DISCONTINUED] ondansetron  (ZOFRAN ) 4 mg tablet     . [DISCONTINUED] plecanatide  (Trulance ) 3 mg tab Take 1 tablet by mouth Once Daily.    . [DISCONTINUED] tamsulosin  (FLOMAX ) 0.4 mg cap      No current facility-administered medications on file prior to visit.    Family History:  Family History  Problem Relation Name Age of Onset  . Heart disease Mother    . Arthritis Mother    . Carpal tunnel syndrome Mother    . Diabetes Brother      Social History:    Vitals: There were no vitals filed for this visit.  Physical Exam:  Alert and oriented x 3. Well developed, well nourished. Regular gait.  Bilateral upper extremities: Intact to light touch sensation and capillary refill in the fingertips.  There is good soft tissue turgor in the fingertips.  She can flex and extend the IP joint of the thumb and can cross her fingers.   No wounds. No swelling, erythema, or ecchymosis. No rashes or lesions.  No signs or symptoms of dystrophy.   Compartments are soft. She is tender to palpation at the [facility] joint of the thumb. Grind test is positive, reproducing her symptoms.  I do not feel a flexor tendon nodules of the index fingers.  There is no demonstrable triggering.  Special Investigations- Review of Diagnostic Tests:   Radiologic Studies: No radiographs taken or reviewed today.  Assessment:    ICD-10-CM   1. Bilateral carpal tunnel syndrome  G56.03     2. Primary osteoarthritis of both first carpometacarpal joints  M18.0 Amb DME      Plan: I discussed with Kristi Avila the nature of carpal tunnel syndrome.  We discussed treatment options including acceptance,  day and/or nighttime splinting, injection of the carpal tunnel, and carpal tunnel release.  At this time she would like to proceed with right carpal tunnel release.  She has had previous right carpal tunnel release.  We discussed that the incision will be larger for a repeat release.  We also discussed possible fat pad transfer.   We discussed that nocturnal symptoms tend to improve relatively quickly after surgery.  Numbness and tingling tends to improve though may not resolve entirely.  Muscular atrophy does not change.  The risks, benefits, and alternatives of surgery were discussed including the  risks of blood loss, infection, damage to nerve/vessel/tendon/ligament/bone, failure of surgery, need for additional surgery, complications of wounds healing, recurrence of carpal tunnel syndrome, and damage to motor branch.   She voiced understanding of the risks and elected to proceed.  We will have this arranged at her convenience.  She agrees with the plan of care.  We also discussed the nature of CMC osteoarthritis. We discussed nonoperative treatment with acceptance versus anti-inflammatories on a p.r.n. basis, along with hard and soft CMC splinting. We also discussed the possibility of injection of the [facility] joint if these modalities are ineffective. Surgical options include trapeziectomy with ligament reconstruction.  We will provide her a Comfort Cool CMC splint.  She will use anti-inflammatories with food on a p.r.n. basis.  She may also try an over the counter topical anti-inflammatory or CBD oil mixed with Shea butter as needed.  She agrees with the plan of care.  Follow up: No follow-ups on file.  Orders Placed This Encounter  Procedures  . Amb DME

## 2023-02-08 ENCOUNTER — Telehealth: Payer: Self-pay | Admitting: Medical

## 2023-02-08 ENCOUNTER — Other Ambulatory Visit: Payer: Self-pay | Admitting: Orthopedic Surgery

## 2023-02-08 ENCOUNTER — Other Ambulatory Visit (HOSPITAL_COMMUNITY): Payer: Self-pay

## 2023-02-08 ENCOUNTER — Telehealth: Payer: Self-pay

## 2023-02-08 MED ORDER — VALACYCLOVIR HCL 500 MG PO TABS
500.0000 mg | ORAL_TABLET | Freq: Every day | ORAL | 2 refills | Status: DC
  Filled 2023-02-08: qty 90, 90d supply, fill #0
  Filled 2023-05-06 – 2023-05-07 (×3): qty 90, 90d supply, fill #1
  Filled 2023-08-07: qty 90, 90d supply, fill #2

## 2023-02-08 NOTE — Telephone Encounter (Signed)
[facility] called to transfer Valtrex from [facility] [employer]

## 2023-02-08 NOTE — Telephone Encounter (Signed)
Following up on pt PAP [employer]) spoke with pt explain she needed to submitted proof of income, explain she was denied from social security, Tessa Lerner was asking to fax that imf. to them, pt has already done that on 02/07/23,will follow up with company in a few days.

## 2023-02-09 ENCOUNTER — Other Ambulatory Visit (HOSPITAL_COMMUNITY): Payer: Self-pay

## 2023-02-09 MED FILL — Duloxetine HCl Enteric Coated Pellets Cap 60 MG (Base Eq): ORAL | 90 days supply | Qty: 180 | Fill #0 | Status: AC

## 2023-02-11 ENCOUNTER — Other Ambulatory Visit: Payer: Self-pay

## 2023-02-11 ENCOUNTER — Other Ambulatory Visit (HOSPITAL_COMMUNITY): Payer: Self-pay

## 2023-02-12 NOTE — Telephone Encounter (Signed)
Following up on pt PAP [employer] pt ask to call company to follow up on her application, pt explain she has already submitted paperwork that was missing on application,call [employer] they said they have received her paperwork but now there missing pt ins card ,call pt to ask if there is a new card,pt will fax in her new Ins card and I will follow up in a few days.

## 2023-02-13 ENCOUNTER — Telehealth: Payer: Self-pay | Admitting: Medical

## 2023-02-13 NOTE — Telephone Encounter (Signed)
Pt brought by [employer] denial for 2025  [facility] t# [phone]

## 2023-02-14 ENCOUNTER — Other Ambulatory Visit (HOSPITAL_COMMUNITY): Payer: Self-pay

## 2023-02-15 ENCOUNTER — Other Ambulatory Visit (HOSPITAL_COMMUNITY): Payer: Self-pay

## 2023-02-18 ENCOUNTER — Institutional Professional Consult (permissible substitution): Payer: Medicare Other | Admitting: Medical

## 2023-02-19 DIAGNOSIS — Z1231 Encounter for screening mammogram for malignant neoplasm of breast: Secondary | ICD-10-CM | POA: Diagnosis not present

## 2023-02-19 LAB — HM MAMMOGRAPHY

## 2023-02-20 ENCOUNTER — Encounter: Payer: Self-pay | Admitting: Medical

## 2023-02-21 NOTE — Telephone Encounter (Signed)
 Contacted [employer] to follow up on application. They are now requesting an appeal letter requesting that her application be re-opened. Submitted to company today, pending decision.

## 2023-02-28 NOTE — Telephone Encounter (Signed)
Contacted [employer] to follow up on status of appeal. Automated system still says denied. Spoke with customer service representative and they state appeal is still pending, to allow up to 30 days from date of submission for appeal to be processed. If no update by 03/25/23, will follow up.

## 2023-03-02 ENCOUNTER — Other Ambulatory Visit: Payer: Self-pay | Admitting: Medical

## 2023-03-02 ENCOUNTER — Other Ambulatory Visit (HOSPITAL_COMMUNITY): Payer: Self-pay

## 2023-03-04 ENCOUNTER — Other Ambulatory Visit: Payer: Self-pay

## 2023-03-04 ENCOUNTER — Other Ambulatory Visit (HOSPITAL_COMMUNITY): Payer: Self-pay

## 2023-03-04 MED ORDER — MELOXICAM 15 MG PO TABS
ORAL_TABLET | ORAL | 0 refills | Status: DC
  Filled 2023-03-04: qty 90, 90d supply, fill #0

## 2023-03-05 ENCOUNTER — Other Ambulatory Visit: Payer: Self-pay

## 2023-03-05 ENCOUNTER — Encounter (HOSPITAL_BASED_OUTPATIENT_CLINIC_OR_DEPARTMENT_OTHER): Payer: Self-pay | Admitting: Orthopedic Surgery

## 2023-03-08 DIAGNOSIS — R200 Anesthesia of skin: Secondary | ICD-10-CM | POA: Diagnosis not present

## 2023-03-08 DIAGNOSIS — M542 Cervicalgia: Secondary | ICD-10-CM | POA: Diagnosis not present

## 2023-03-08 DIAGNOSIS — M5412 Radiculopathy, cervical region: Secondary | ICD-10-CM | POA: Diagnosis not present

## 2023-03-08 DIAGNOSIS — R202 Paresthesia of skin: Secondary | ICD-10-CM | POA: Diagnosis not present

## 2023-03-09 DIAGNOSIS — E785 Hyperlipidemia, unspecified: Secondary | ICD-10-CM | POA: Diagnosis not present

## 2023-03-09 DIAGNOSIS — K219 Gastro-esophageal reflux disease without esophagitis: Secondary | ICD-10-CM | POA: Diagnosis not present

## 2023-03-09 DIAGNOSIS — E669 Obesity, unspecified: Secondary | ICD-10-CM | POA: Diagnosis not present

## 2023-03-09 DIAGNOSIS — K5900 Constipation, unspecified: Secondary | ICD-10-CM | POA: Diagnosis not present

## 2023-03-09 DIAGNOSIS — H04123 Dry eye syndrome of bilateral lacrimal glands: Secondary | ICD-10-CM | POA: Diagnosis not present

## 2023-03-09 DIAGNOSIS — R32 Unspecified urinary incontinence: Secondary | ICD-10-CM | POA: Diagnosis not present

## 2023-03-09 DIAGNOSIS — G8929 Other chronic pain: Secondary | ICD-10-CM | POA: Diagnosis not present

## 2023-03-09 DIAGNOSIS — G629 Polyneuropathy, unspecified: Secondary | ICD-10-CM | POA: Diagnosis not present

## 2023-03-09 DIAGNOSIS — M1990 Unspecified osteoarthritis, unspecified site: Secondary | ICD-10-CM | POA: Diagnosis not present

## 2023-03-09 DIAGNOSIS — F419 Anxiety disorder, unspecified: Secondary | ICD-10-CM | POA: Diagnosis not present

## 2023-03-09 DIAGNOSIS — Z89422 Acquired absence of other left toe(s): Secondary | ICD-10-CM | POA: Diagnosis not present

## 2023-03-09 DIAGNOSIS — F3342 Major depressive disorder, recurrent, in full remission: Secondary | ICD-10-CM | POA: Diagnosis not present

## 2023-03-12 ENCOUNTER — Other Ambulatory Visit: Payer: Self-pay

## 2023-03-12 ENCOUNTER — Ambulatory Visit (HOSPITAL_BASED_OUTPATIENT_CLINIC_OR_DEPARTMENT_OTHER)
Admission: RE | Admit: 2023-03-12 | Discharge: 2023-03-12 | Disposition: A | Discharge status: Home / Self Care | Payer: HMO | Attending: Orthopedic Surgery | Admitting: Orthopedic Surgery

## 2023-03-12 ENCOUNTER — Encounter (HOSPITAL_BASED_OUTPATIENT_CLINIC_OR_DEPARTMENT_OTHER): Payer: Self-pay | Admitting: Orthopedic Surgery

## 2023-03-12 ENCOUNTER — Ambulatory Visit (HOSPITAL_BASED_OUTPATIENT_CLINIC_OR_DEPARTMENT_OTHER): Payer: HMO | Admitting: Anesthesiology

## 2023-03-12 ENCOUNTER — Encounter (HOSPITAL_BASED_OUTPATIENT_CLINIC_OR_DEPARTMENT_OTHER)
Admission: RE | Disposition: A | Discharge status: Home / Self Care | Payer: Self-pay | Source: Home / Self Care | Attending: Orthopedic Surgery

## 2023-03-12 DIAGNOSIS — G5601 Carpal tunnel syndrome, right upper limb: Secondary | ICD-10-CM | POA: Insufficient documentation

## 2023-03-12 DIAGNOSIS — F32A Depression, unspecified: Secondary | ICD-10-CM | POA: Diagnosis not present

## 2023-03-12 DIAGNOSIS — Z01818 Encounter for other preprocedural examination: Secondary | ICD-10-CM

## 2023-03-12 DIAGNOSIS — M1811 Unilateral primary osteoarthritis of first carpometacarpal joint, right hand: Secondary | ICD-10-CM | POA: Diagnosis not present

## 2023-03-12 DIAGNOSIS — M1812 Unilateral primary osteoarthritis of first carpometacarpal joint, left hand: Secondary | ICD-10-CM | POA: Diagnosis not present

## 2023-03-12 DIAGNOSIS — M180 Bilateral primary osteoarthritis of first carpometacarpal joints: Secondary | ICD-10-CM

## 2023-03-12 DIAGNOSIS — K219 Gastro-esophageal reflux disease without esophagitis: Secondary | ICD-10-CM | POA: Diagnosis not present

## 2023-03-12 DIAGNOSIS — F419 Anxiety disorder, unspecified: Secondary | ICD-10-CM | POA: Insufficient documentation

## 2023-03-12 HISTORY — PX: KENALOG INJECTION: SHX5298

## 2023-03-12 HISTORY — PX: CARPAL TUNNEL RELEASE: SHX101

## 2023-03-12 SURGERY — CARPAL TUNNEL RELEASE
Anesthesia: General | Site: Wrist | Laterality: Right

## 2023-03-12 MED ORDER — TRIAMCINOLONE ACETONIDE 40 MG/ML IJ SUSP
INTRAMUSCULAR | Status: AC
  Filled 2023-03-12: qty 5

## 2023-03-12 MED ORDER — GABAPENTIN 300 MG PO CAPS
300.0000 mg | ORAL_CAPSULE | Freq: Once | ORAL | Status: AC
  Administered 2023-03-12: 300 mg via ORAL

## 2023-03-12 MED ORDER — ACETAMINOPHEN 500 MG PO TABS
ORAL_TABLET | ORAL | Status: AC
  Filled 2023-03-12: qty 2

## 2023-03-12 MED ORDER — OXYCODONE HCL 5 MG/5ML PO SOLN: 5.0000 mg | Freq: Once | ORAL | Status: DC | PRN

## 2023-03-12 MED ORDER — SODIUM CHLORIDE 0.9 % IV SOLN: 12.5000 mg | INTRAVENOUS | Status: DC | PRN

## 2023-03-12 MED ORDER — VANCOMYCIN HCL IN DEXTROSE 1-5 GM/200ML-% IV SOLN
1000.0000 mg | INTRAVENOUS | Status: AC
  Administered 2023-03-12: 1000 mg via INTRAVENOUS

## 2023-03-12 MED ORDER — DEXAMETHASONE SODIUM PHOSPHATE 10 MG/ML IJ SOLN
INTRAMUSCULAR | Status: DC | PRN
  Administered 2023-03-12: 5 mg via INTRAVENOUS

## 2023-03-12 MED ORDER — EPHEDRINE SULFATE (PRESSORS) 50 MG/ML IJ SOLN
INTRAMUSCULAR | Status: DC | PRN
Start: 2023-03-12 — End: 2023-03-12
  Administered 2023-03-12: 5 mg via INTRAVENOUS

## 2023-03-12 MED ORDER — GABAPENTIN 300 MG PO CAPS
ORAL_CAPSULE | ORAL | Status: AC
  Filled 2023-03-12: qty 1

## 2023-03-12 MED ORDER — PROPOFOL 10 MG/ML IV BOLUS
INTRAVENOUS | Status: AC
  Filled 2023-03-12: qty 20

## 2023-03-12 MED ORDER — 0.9 % SODIUM CHLORIDE (POUR BTL) OPTIME
TOPICAL | Status: DC | PRN
  Administered 2023-03-12: 1000 mL

## 2023-03-12 MED ORDER — OXYCODONE HCL 5 MG PO TABS: 5.0000 mg | ORAL_TABLET | Freq: Once | ORAL | Status: DC | PRN

## 2023-03-12 MED ORDER — HYDROCODONE-ACETAMINOPHEN 5-325 MG PO TABS: ORAL_TABLET | ORAL | 0 refills | Status: DC

## 2023-03-12 MED ORDER — MIDAZOLAM HCL 2 MG/2ML IJ SOLN
INTRAMUSCULAR | Status: AC
  Filled 2023-03-12: qty 2

## 2023-03-12 MED ORDER — ONDANSETRON HCL 4 MG/2ML IJ SOLN
INTRAMUSCULAR | Status: DC | PRN
  Administered 2023-03-12: 4 mg via INTRAVENOUS

## 2023-03-12 MED ORDER — AMISULPRIDE (ANTIEMETIC) 5 MG/2ML IV SOLN: 10.0000 mg | Freq: Once | INTRAVENOUS | Status: DC | PRN

## 2023-03-12 MED ORDER — FENTANYL CITRATE (PF) 100 MCG/2ML IJ SOLN: 25.0000 ug | INTRAMUSCULAR | Status: DC | PRN

## 2023-03-12 MED ORDER — PROPOFOL 10 MG/ML IV BOLUS
INTRAVENOUS | Status: DC | PRN
  Administered 2023-03-12: 50 mg via INTRAVENOUS
  Administered 2023-03-12: 200 mg via INTRAVENOUS

## 2023-03-12 MED ORDER — FENTANYL CITRATE (PF) 100 MCG/2ML IJ SOLN
INTRAMUSCULAR | Status: AC
Start: 2023-03-12 — End: ?
  Filled 2023-03-12: qty 2

## 2023-03-12 MED ORDER — LIDOCAINE HCL (CARDIAC) PF 100 MG/5ML IV SOSY
PREFILLED_SYRINGE | INTRAVENOUS | Status: DC | PRN
  Administered 2023-03-12: 50 mg via INTRAVENOUS

## 2023-03-12 MED ORDER — TRIAMCINOLONE ACETONIDE 40 MG/ML IJ SUSP
INTRAMUSCULAR | Status: DC | PRN
  Administered 2023-03-12: 24 mL

## 2023-03-12 MED ORDER — VANCOMYCIN HCL IN DEXTROSE 1-5 GM/200ML-% IV SOLN
INTRAVENOUS | Status: AC
  Filled 2023-03-12: qty 200

## 2023-03-12 MED ORDER — LIDOCAINE HCL (PF) 1 % IJ SOLN
INTRAMUSCULAR | Status: AC
  Filled 2023-03-12: qty 30

## 2023-03-12 MED ORDER — LIDOCAINE 2% (20 MG/ML) 5 ML SYRINGE
INTRAMUSCULAR | Status: AC
  Filled 2023-03-12: qty 5

## 2023-03-12 MED ORDER — LEVOFLOXACIN IN D5W 500 MG/100ML IV SOLN: 500.0000 mg | INTRAVENOUS | Status: DC

## 2023-03-12 MED ORDER — FENTANYL CITRATE (PF) 100 MCG/2ML IJ SOLN
INTRAMUSCULAR | Status: DC | PRN
  Administered 2023-03-12: 100 ug via INTRAVENOUS

## 2023-03-12 MED ORDER — MIDAZOLAM HCL 5 MG/5ML IJ SOLN
INTRAMUSCULAR | Status: DC | PRN
  Administered 2023-03-12: 2 mg via INTRAVENOUS

## 2023-03-12 MED ORDER — ONDANSETRON HCL 4 MG/2ML IJ SOLN
INTRAMUSCULAR | Status: AC
  Filled 2023-03-12: qty 2

## 2023-03-12 MED ORDER — LACTATED RINGERS IV SOLN: INTRAVENOUS | Status: DC

## 2023-03-12 MED ORDER — MEPERIDINE HCL 25 MG/ML IJ SOLN
6.2500 mg | INTRAMUSCULAR | Status: DC | PRN
Start: 2023-03-12 — End: 2023-03-12

## 2023-03-12 MED ORDER — LEVOFLOXACIN IN D5W 500 MG/100ML IV SOLN
INTRAVENOUS | Status: AC
  Filled 2023-03-12: qty 100

## 2023-03-12 MED ORDER — HYDROMORPHONE HCL 1 MG/ML IJ SOLN: 0.2500 mg | INTRAMUSCULAR | Status: DC | PRN

## 2023-03-12 MED ORDER — ACETAMINOPHEN 500 MG PO TABS
1000.0000 mg | ORAL_TABLET | Freq: Once | ORAL | Status: AC
  Administered 2023-03-12: 1000 mg via ORAL

## 2023-03-12 MED ORDER — VANCOMYCIN HCL 1000 MG IV SOLR
INTRAVENOUS | Status: DC | PRN
  Administered 2023-03-12: 1000 mg via INTRAVENOUS

## 2023-03-12 MED ORDER — BUPIVACAINE HCL (PF) 0.25 % IJ SOLN
INTRAMUSCULAR | Status: DC | PRN
  Administered 2023-03-12: 9 mL

## 2023-03-12 MED ORDER — DROPERIDOL 2.5 MG/ML IJ SOLN: 0.6250 mg | Freq: Once | INTRAMUSCULAR | Status: DC | PRN

## 2023-03-12 SURGICAL SUPPLY — 32 items
BLADE SURG 15 STRL LF DISP TIS (BLADE) ×6 IMPLANT
BNDG ELASTIC 3INX 5YD STR LF (GAUZE/BANDAGES/DRESSINGS) ×2 IMPLANT
BNDG ELASTIC 4INX 5YD STR LF (GAUZE/BANDAGES/DRESSINGS) ×2 IMPLANT
BNDG ESMARK 4X9 LF (GAUZE/BANDAGES/DRESSINGS) ×1 IMPLANT
BNDG GAUZE DERMACEA FLUFF 4 (GAUZE/BANDAGES/DRESSINGS) ×3 IMPLANT
CHLORAPREP W/TINT 26 (MISCELLANEOUS) ×3 IMPLANT
CORD BIPOLAR FORCEPS 12FT (ELECTRODE) ×3 IMPLANT
COVER BACK TABLE 60X90IN (DRAPES) ×3 IMPLANT
COVER MAYO STAND STRL (DRAPES) ×3 IMPLANT
CUFF TOURN SGL QUICK 18X4 (TOURNIQUET CUFF) ×3 IMPLANT
DRAPE EXTREMITY T 121X128X90 (DISPOSABLE) ×3 IMPLANT
DRAPE SURG 17X23 STRL (DRAPES) ×3 IMPLANT
GAUZE PAD ABD 8X10 STRL (GAUZE/BANDAGES/DRESSINGS) ×3 IMPLANT
GAUZE SPONGE 4X4 12PLY STRL (GAUZE/BANDAGES/DRESSINGS) ×3 IMPLANT
GAUZE XEROFORM 1X8 LF (GAUZE/BANDAGES/DRESSINGS) ×3 IMPLANT
GLOVE BIO SURGEON STRL SZ7.5 (GLOVE) ×3 IMPLANT
GLOVE BIOGEL PI IND STRL 8 (GLOVE) ×3 IMPLANT
GOWN STRL REUS W/ TWL LRG LVL3 (GOWN DISPOSABLE) ×3 IMPLANT
GOWN STRL REUS W/TWL XL LVL3 (GOWN DISPOSABLE) ×3 IMPLANT
NDL HYPO 25X1 1.5 SAFETY (NEEDLE) ×2 IMPLANT
NEEDLE HYPO 25X1 1.5 SAFETY (NEEDLE) ×6 IMPLANT
NS IRRIG 1000ML POUR BTL (IV SOLUTION) ×3 IMPLANT
PACK BASIN DAY SURGERY FS (CUSTOM PROCEDURE TRAY) ×3 IMPLANT
PADDING CAST ABS COTTON 4X4 ST (CAST SUPPLIES) ×2 IMPLANT
STOCKINETTE 4X48 STRL (DRAPES) ×3 IMPLANT
SUT ETHILON 4 0 PS 2 18 (SUTURE) ×4 IMPLANT
SYR 10ML LL (SYRINGE) ×1 IMPLANT
SYR 3ML 18GX1 1/2 (SYRINGE) ×2 IMPLANT
SYR BULB EAR ULCER 3OZ GRN STR (SYRINGE) ×3 IMPLANT
SYR CONTROL 10ML LL (SYRINGE) ×2 IMPLANT
TOWEL GREEN STERILE FF (TOWEL DISPOSABLE) ×6 IMPLANT
UNDERPAD 30X36 HEAVY ABSORB (UNDERPADS AND DIAPERS) ×3 IMPLANT

## 2023-03-12 NOTE — Op Note (Signed)
 I assisted Surgeons and Role:    * Betha Loa, MD - Primary    Cindee Salt, MD - Assisting on the Procedure(s): RIGHT RECURRENT CARPAL TUNNEL RELEASE KENALOG INJECTION on 03/12/2023.  I provided assistance on this case as follows: Set up, approach, identification of the median nerve, retraction for neurolysis of median nerve, verification of the motor branch, closure of the wound application of the dressing. Traction for injection of the carpometacarpal joint of the thumb,  Electronically signed by: Cindee Salt, MD Date: 03/12/2023 Time: 2:20 PM

## 2023-03-12 NOTE — Anesthesia Procedure Notes (Signed)
 Procedure Name: LMA Insertion Date/Time: 03/12/2023 1:33 PM  Performed by: Ronnette Hila, CRNAPre-anesthesia Checklist: Patient identified, Emergency Drugs available, Suction available and Patient being monitored Patient Re-evaluated:Patient Re-evaluated prior to induction Oxygen Delivery Method: Circle System Utilized Preoxygenation: Pre-oxygenation with 100% oxygen Induction Type: IV induction Ventilation: Mask ventilation without difficulty LMA: LMA inserted LMA Size: 4.0 Number of attempts: 1 Airway Equipment and Method: bite block Placement Confirmation: positive ETCO2 Tube secured with: Tape Dental Injury: Teeth and Oropharynx as per pre-operative assessment

## 2023-03-12 NOTE — Anesthesia Preprocedure Evaluation (Addendum)
 Anesthesia Evaluation  Patient identified by MRN, date of birth, ID band Patient awake    Reviewed: Allergy & Precautions, H&P , NPO status , Patient's Chart, lab work & pertinent test results, Unable to perform ROS - Chart review only  History of Anesthesia Complications (+) PONV, Family history of anesthesia reaction and history of anesthetic complications (family history of mother having delayed emergence)  Airway Mallampati: II  TM Distance: >3 FB Neck ROM: Full    Dental no notable dental hx.    Pulmonary neg pulmonary ROS, neg shortness of breath   Pulmonary exam normal breath sounds clear to auscultation       Cardiovascular Exercise Tolerance: Good negative cardio ROS Normal cardiovascular exam Rhythm:Regular Rate:Normal  Exercises regularly using a stationary bike without chest pain or SOB   Neuro/Psych  PSYCHIATRIC DISORDERS Anxiety Depression    negative neurological ROS     GI/Hepatic negative GI ROS, Neg liver ROS,GERD  Medicated and Controlled,,  Endo/Other  negative endocrine ROS    Renal/GU negative Renal ROS Bladder dysfunction      Musculoskeletal  (+) Arthritis , Osteoarthritis,    Abdominal  (+) + obese  Peds negative pediatric ROS (+)  Hematology negative hematology ROS (+)   Anesthesia Other Findings   Reproductive/Obstetrics negative OB ROS                             Anesthesia Physical Anesthesia Plan  ASA: 2  Anesthesia Plan: General   Post-op Pain Management: Tylenol PO (pre-op)* and Gabapentin PO (pre-op)*   Induction: Intravenous  PONV Risk Score and Plan: 4 or greater and Ondansetron, Treatment may vary due to age or medical condition, Dexamethasone, Midazolam and Droperidol  Airway Management Planned: LMA  Additional Equipment:   Intra-op Plan:   Post-operative Plan: Extubation in OR  Informed Consent: I have reviewed the patients History  and Physical, chart, labs and discussed the procedure including the risks, benefits and alternatives for the proposed anesthesia with the patient or authorized representative who has indicated his/her understanding and acceptance.     Dental advisory given  Plan Discussed with: CRNA  Anesthesia Plan Comments:         Anesthesia Quick Evaluation

## 2023-03-12 NOTE — Anesthesia Postprocedure Evaluation (Signed)
 Anesthesia Post Note  Patient: Kristi Avila  Procedure(s) Performed: RIGHT RECURRENT CARPAL TUNNEL RELEASE (Right: Wrist) KENALOG INJECTION (Bilateral: Thumb)     Patient location during evaluation: PACU Anesthesia Type: General Level of consciousness: awake and alert Pain management: pain level controlled Vital Signs Assessment: post-procedure vital signs reviewed and stable Respiratory status: spontaneous breathing, nonlabored ventilation and respiratory function stable Cardiovascular status: blood pressure returned to baseline and stable Postop Assessment: no apparent nausea or vomiting Anesthetic complications: no   No notable events documented.  Last Vitals:  Vitals:   03/12/23 1455 03/12/23 1504  BP: (!) 140/78 (!) 151/82  Pulse: 64 65  Resp: 15 16  Temp:  (!) 36.2 C  SpO2: 92% 94%    Last Pain:  Vitals:   03/12/23 1504  TempSrc:   PainSc: 0-No pain                 Lowella Curb

## 2023-03-12 NOTE — Transfer of Care (Signed)
 Immediate Anesthesia Transfer of Care Note  Patient: Kristi Avila  Procedure(s) Performed: RIGHT RECURRENT CARPAL TUNNEL RELEASE (Right: Wrist) KENALOG INJECTION (Bilateral: Thumb)  Patient Location: PACU  Anesthesia Type:General  Level of Consciousness: awake, alert , oriented, drowsy, and patient cooperative  Airway & Oxygen Therapy: Patient Spontanous Breathing and Patient connected to face mask oxygen  Post-op Assessment: Report given to RN and Post -op Vital signs reviewed and stable  Post vital signs: Reviewed and stable  Last Vitals:  Vitals Value Taken Time  BP    Temp    Pulse    Resp    SpO2      Last Pain:  Vitals:   03/12/23 1157  TempSrc: Oral  PainSc: 0-No pain      Patients Stated Pain Goal: 5 (03/12/23 1157)  Complications: No notable events documented.

## 2023-03-12 NOTE — Op Note (Signed)
 NAME: Kristi Avila MEDICAL RECORD NO: [id number] DATE OF BIRTH: [birth year] FACILITY: [facility] LOCATION: [facility] PHYSICIAN: Tami Ribas, MD   OPERATIVE REPORT   DATE OF PROCEDURE: 03/12/23    PREOPERATIVE DIAGNOSIS: 1.  Right recurrent carpal tunnel syndrome 2.  Bilateral thumb CMC osteoarthritis   POSTOPERATIVE DIAGNOSIS: 1.  Right recurrent carpal tunnel syndrome 2.  Bilateral thumb CMC osteoarthritis   PROCEDURE: 1.  Right repeat carpal tunnel release 2.  Injection bilateral thumb CMC joints, 12 mg triamcinolone, 0.3 mL 1% lidocaine   SURGEON:  Betha Loa, M.D.   ASSISTANT: Cindee Salt, MD   ANESTHESIA:  General   INTRAVENOUS FLUIDS:  Per anesthesia flow sheet.   ESTIMATED BLOOD LOSS:  Minimal.   COMPLICATIONS:  None.   SPECIMENS:  none   TOURNIQUET TIME:    Total Tourniquet Time Documented: Upper Arm (Right) - 24 minutes Total: Upper Arm (Right) - 24 minutes    DISPOSITION:  Stable to PACU.   INDICATIONS: 68 year old female with right recurrent carpal tunnel syndrome.  Positive nerve conduction studies.  She wishes to proceed with right repeat carpal tunnel release.  She is also been experiencing bilateral thumb CMC pain from osteoarthritis.  She wishes to have bilateral thumb CMC injections.  Risks, benefits and alternatives of surgery were discussed including the risks of blood loss, infection, damage to nerves, vessels, tendons, ligaments, bone for surgery, need for additional surgery, complications with wound healing, continued pain, stiffness, , recurrence.  She voiced understanding of these risks and elected to proceed.  OPERATIVE COURSE:  After being identified preoperatively by myself,  the patient and I agreed on the procedure and site of the procedure.  The surgical site was marked.  Surgical consent had been signed. Preoperative IV antibiotic prophylaxis was given. She was transferred to the operating room and placed on the  operating table in supine position with the Right upper extremity on an arm board.  General anesthesia was induced by the anesthesiologist.  Right upper extremity was prepped and draped in normal sterile orthopedic fashion.  A surgical pause was performed between the surgeons, anesthesia, and operating room staff and all were in agreement as to the patient, procedure, and site of procedure.  Tourniquet at the proximal aspect of the extremity was inflated to 250 mmHg after exsanguination of the arm with an Esmarch bandage.  Incision was made over the transverse carpal ligament and into the distal aspect of the forearm.  The median nerve was identified proximally underneath the volar antebrachial fascia.  The fascia was sharply released from proximal to distal under direct visualization while protecting the nerve.  The transverse carpal ligament which had reformed was sharply released with the knife while protecting the nerve.  Care was taken to ensure complete decompression.  There was some adhesions surrounding the nerve.  These were released.  Motor branch was identified and was intact.  No remaining compression was noted.  The wound was copiously irrigated with sterile saline and closed with 4-0 nylon in a horizontal mattress fashion.  He was injected with quarter percent plain Marcaine to aid in postoperative analgesia.  The right thumb CMC joint was injected with a solution of 0.3 mL of triamcinolone and 0.3 mL of plain lidocaine.  The wound was dressed with sterile Xeroform 4 x 4's and ABD and wrapped with Kerlix and Ace bandage.  The tourniquet was deflated at 24 minutes.  Fingertips were pink with brisk capillary refill after deflation of tourniquet.  The left thumb CMC joint was then injected with 0.3 mL of triamcinolone and 0.3 mL of plain lidocaine after prepping the skin with alcohol.  The operative  drapes were broken down.  The patient was awoken from anesthesia safely.  She was transferred back to the  stretcher and taken to PACU in stable condition.  I will see her back in the office in 1 week for postoperative followup.  I will give her a prescription for Norco 5/325 1-2 tabs PO q6 hours prn pain, dispense # 15.   Betha Loa, MD Electronically signed, 03/12/23

## 2023-03-12 NOTE — Discharge Instructions (Addendum)
 [facility] Instructions Hand Surgery  Wound Care: Keep your hand elevated above the level of your heart.  Do not allow it to dangle by your side.  Keep the dressing dry and do not remove it unless your doctor advises you to do so.  He will usually change it at the time of your post-op visit.  Moving your fingers is advised to stimulate circulation but will depend on the site of your surgery.  If you have a splint applied, your doctor will advise you regarding movement.  Activity: Do not drive or operate machinery today.  Rest today and then you may return to your normal activity and work as indicated by your physician.  Diet:  Drink liquids today or eat a light diet.  You may resume a regular diet tomorrow.    General expectations: Pain for two to three days. Fingers may become slightly swollen.  Call your doctor if any of the following occur: Severe pain not relieved by pain medication. Elevated temperature. Dressing soaked with blood. Inability to move fingers. White or bluish color to fingers.   No Tylenol until 6pm.

## 2023-03-12 NOTE — H&P (Signed)
 Kristi Avila is an 68 y.o. female.   Chief Complaint: right recurrent carpal tunnel syndrome HPI: 68 y.o. yo female with numbness and tingling right hand.  Nocturnal symptoms. Positive nerve conduction studies. She wishes to have repeat right carpal tunnel release. She has also been experiencing pain at the cmc joints of bilateral thumbs and wishes to have bilateral cmc injections as well.  Allergies:  Allergies  Allergen Reactions   Cephalosporins Diarrhea   Codeine Itching   Latex Itching   Pravastatin     Myalgias   Simvastatin     myalgias   Sulfa Antibiotics Diarrhea    Past Medical History:  Diagnosis Date   Arthritis    Barrett's esophagus with esophagitis 07/23/2012   EGD: CLO test results negative. pathology: Barretss: Repeat EGD 3 years   Blood transfusion without reported diagnosis    remote past   Cancer (HCC)    squamous cell CA- on legs   Depression    Family history of anesthesia complication    mother trouble waking up   Frequency of urination    GERD (gastroesophageal reflux disease)    h/o peptic ulcer   High cholesterol    HSV-2 infection    anal   Nocturia    Obesity    Overactive bladder    Reflux    Urgency of urination    UTI (urinary tract infection)     Past Surgical History:  Procedure Laterality Date   ARTHROSCOPY KNEE W/ DRILLING     X4 ON RT KNEE   CARPAL TUNNEL RELEASE Right 2000   COLONOSCOPY  2017   repeat 2027   colonscopy  2007, 2017   ESOPHAGOGASTRODUODENOSCOPY  2007   Dr. Noe Gens, [facility]   foot fibroma surgery  all before 1985   x 3   HEMORRHOID SURGERY  04/2006   HIP FRACTURE SURGERY Right 2018   KNEE ARTHROSCOPY     X2 LEFT KNEE   LIPOMA EXCISION  11/2020   SINUS EXPLORATION  1995   TOE AMPUTATION  1959   TOTAL HIP ARTHROPLASTY Left 2022   TOTAL KNEE ARTHROPLASTY Right 09/22/2012   Procedure: RIGHT TOTAL KNEE ARTHROPLASTY;  Surgeon: Loanne Drilling, MD;  Location: [facility];  Service: Orthopedics;  Laterality:  Right;   TOTAL KNEE ARTHROPLASTY Left 09/28/2013   Procedure: LEFT TOTAL KNEE ARTHROPLASTY, CORTISONE INJECTION RIGHT HIP;  Surgeon: Loanne Drilling, MD;  Location: [facility];  Service: Orthopedics;  Laterality: Left;   UPPER GASTROINTESTINAL ENDOSCOPY      Family History: Family History  Problem Relation Age of Onset   Arthritis Mother    Pulmonary fibrosis Father        pulmonary fibrosis   Pulmonary disease Father    Cancer Sister        pancreatic   Diabetes Brother    Colon cancer Neg Hx    Esophageal cancer Neg Hx    Stomach cancer Neg Hx    Rectal cancer Neg Hx     Social History:   reports that she has never smoked. She has never used smokeless tobacco. She reports current alcohol use of about 2.0 standard drinks of alcohol per week. She reports that she does not use drugs.  Medications: Medications Prior to Admission  Medication Sig Dispense Refill   dexlansoprazole (DEXILANT) 60 MG capsule TAKE 1 CAPSULE(60 MG) BY MOUTH DAILY 90 capsule 3   DULoxetine (CYMBALTA) 60 MG capsule TAKE 1 CAPSULE BY MOUTH TWICE  DAILY  180 capsule 3   ezetimibe (ZETIA) 10 MG tablet TAKE 1 TABLET BY MOUTH DAILY 100 tablet 1   famotidine (PEPCID) 20 MG tablet Take 40 mg by mouth at bedtime.     gabapentin (NEURONTIN) 100 MG capsule TAKE UP TO 3 CAPSULES BY MOUTH DAILY AS DIRECTED (Patient taking differently: 300 mg at bedtime. TAKE UP TO 3 CAPSULES BY MOUTH DAILY AS DIRECTED) 90 capsule 0   meloxicam (MOBIC) 15 MG tablet TAKE 1 TABLET(15 MG) BY MOUTH DAILY 90 tablet 0   MULTIPLE VITAMIN PO Take 1 tablet by mouth daily.     Polyethyl Glycol-Propyl Glycol (SYSTANE FREE OP) Apply to eye.     psyllium (METAMUCIL) 58.6 % powder Take by mouth. 2 teaspoons nightly     RESTASIS 0.05 % ophthalmic emulsion Place 1 drop into both eyes 2 (two) times daily.     rosuvastatin (CRESTOR) 5 MG tablet TAKE 1 TABLET(5 MG) BY MOUTH EVERY OTHER DAY 90 tablet 1   solifenacin (VESICARE) 5 MG tablet Take 5 mg by mouth  daily.     valACYclovir (VALTREX) 500 MG tablet Take 1 tablet (500 mg total) by mouth daily. 90 tablet 2   ZINC-VITAMIN C PO Take 1 tablet by mouth daily in the afternoon.     valACYclovir (VALTREX) 500 MG tablet Take 1 tablet (500 mg total) by mouth daily. 90 tablet 2    No results found for this or any previous visit (from the past 48 hours).  No results found.    Height 5\' 6"  (1.676 m), weight 99.8 kg, last menstrual period 12/31/2019.  General appearance: alert, cooperative, and appears stated age Head: Normocephalic, without obvious abnormality, atraumatic Neck: supple, symmetrical, trachea midline Extremities: Intact sensation and capillary refill all digits.  +epl/fpl/io.  No wounds.  Skin: Skin color, texture, turgor normal. No rashes or lesions Neurologic: Grossly normal Incision/Wound: none  Assessment/Plan Right recurrent carpal tunnel syndrome and cmc arthritis.  Non operative and operative treatment options have been discussed with the patient and patient wishes to proceed with operative treatment. Risks, benefits, and alternatives of surgery have been discussed and the patient agrees with the plan of care.   Betha Loa 03/12/2023, 11:40 AM

## 2023-03-13 ENCOUNTER — Encounter (HOSPITAL_BASED_OUTPATIENT_CLINIC_OR_DEPARTMENT_OTHER): Payer: Self-pay | Admitting: Orthopedic Surgery

## 2023-03-14 DIAGNOSIS — Z1331 Encounter for screening for depression: Secondary | ICD-10-CM | POA: Diagnosis not present

## 2023-03-14 DIAGNOSIS — Z124 Encounter for screening for malignant neoplasm of cervix: Secondary | ICD-10-CM | POA: Diagnosis not present

## 2023-03-14 DIAGNOSIS — L292 Pruritus vulvae: Secondary | ICD-10-CM | POA: Diagnosis not present

## 2023-03-14 DIAGNOSIS — Z01411 Encounter for gynecological examination (general) (routine) with abnormal findings: Secondary | ICD-10-CM | POA: Diagnosis not present

## 2023-03-14 DIAGNOSIS — Z01419 Encounter for gynecological examination (general) (routine) without abnormal findings: Secondary | ICD-10-CM | POA: Diagnosis not present

## 2023-03-14 DIAGNOSIS — Z779 Other contact with and (suspected) exposures hazardous to health: Secondary | ICD-10-CM | POA: Diagnosis not present

## 2023-03-14 DIAGNOSIS — N952 Postmenopausal atrophic vaginitis: Secondary | ICD-10-CM | POA: Diagnosis not present

## 2023-03-27 ENCOUNTER — Telehealth: Payer: Self-pay | Admitting: Internal Medicine

## 2023-03-27 ENCOUNTER — Other Ambulatory Visit (HOSPITAL_COMMUNITY): Payer: Self-pay

## 2023-03-27 NOTE — Telephone Encounter (Unsigned)
 Copied from CRM [id number]. Topic: Clinical - Medication Question >> Mar 27, 2023  1:24 PM DeAngela L wrote: Reason for CRM: [employer] follow up call to ask about stomach concerns or pain with the patient after taking Trulong medication call back number is [phone] and the ref# is [id number]

## 2023-03-28 NOTE — Telephone Encounter (Signed)
 I spoke to patient about this and she is not sure what this message is about. She states that she is not having any concerns with [employer]. She did get an approval this year for patient assistance. Not sure if that's who was calling to follow-up. Message isn't clear.

## 2023-03-28 NOTE — Telephone Encounter (Signed)
 [employer] approved for 2025. Patient aware.  Sherrill Raring, PharmD Clinical Pharmacist [phone]

## 2023-04-01 ENCOUNTER — Other Ambulatory Visit: Payer: Self-pay

## 2023-04-01 ENCOUNTER — Other Ambulatory Visit: Payer: Self-pay | Admitting: Medical

## 2023-04-01 ENCOUNTER — Other Ambulatory Visit (HOSPITAL_COMMUNITY): Payer: Self-pay

## 2023-04-01 ENCOUNTER — Telehealth: Payer: Self-pay | Admitting: Internal Medicine

## 2023-04-01 DIAGNOSIS — D225 Melanocytic nevi of trunk: Secondary | ICD-10-CM | POA: Diagnosis not present

## 2023-04-01 DIAGNOSIS — Z85828 Personal history of other malignant neoplasm of skin: Secondary | ICD-10-CM | POA: Diagnosis not present

## 2023-04-01 DIAGNOSIS — L570 Actinic keratosis: Secondary | ICD-10-CM | POA: Diagnosis not present

## 2023-04-01 DIAGNOSIS — L820 Inflamed seborrheic keratosis: Secondary | ICD-10-CM | POA: Diagnosis not present

## 2023-04-01 DIAGNOSIS — L821 Other seborrheic keratosis: Secondary | ICD-10-CM | POA: Diagnosis not present

## 2023-04-01 MED ORDER — SOLIFENACIN SUCCINATE 5 MG PO TABS
5.0000 mg | ORAL_TABLET | Freq: Every day | ORAL | 0 refills | Status: DC
  Filled 2023-04-01: qty 90, 90d supply, fill #0

## 2023-04-01 NOTE — Telephone Encounter (Signed)
 10mg  she wants sent in not 5mg   Copied from CRM [id number]. Topic: Clinical - Prescription Issue >> Apr 01, 2023  8:27 AM Kristi Avila wrote: Reason for CRM: Patient is calling in regards to her VESICARE prescription - she takes 10 mg daily now - Can we re-send an updated prescription to the [facility] pharmacy? (The pharmacy has the prescription for the 5 mg currently).

## 2023-04-02 ENCOUNTER — Other Ambulatory Visit (HOSPITAL_COMMUNITY): Payer: Self-pay

## 2023-04-02 ENCOUNTER — Other Ambulatory Visit: Payer: Self-pay | Admitting: Medical

## 2023-04-02 ENCOUNTER — Other Ambulatory Visit: Payer: Self-pay

## 2023-04-02 MED ORDER — SOLIFENACIN SUCCINATE 10 MG PO TABS
10.0000 mg | ORAL_TABLET | Freq: Every day | ORAL | 0 refills | Status: DC
  Filled 2023-04-02: qty 90, 90d supply, fill #0

## 2023-04-03 ENCOUNTER — Ambulatory Visit: Attending: Medical

## 2023-04-03 ENCOUNTER — Encounter: Payer: Self-pay | Admitting: Medical

## 2023-04-03 ENCOUNTER — Ambulatory Visit (INDEPENDENT_AMBULATORY_CARE_PROVIDER_SITE_OTHER): Payer: Self-pay | Admitting: Medical

## 2023-04-03 VITALS — BP 122/80 | HR 62 | Wt 224.8 lb

## 2023-04-03 DIAGNOSIS — Z974 Presence of external hearing-aid: Secondary | ICD-10-CM | POA: Diagnosis not present

## 2023-04-03 DIAGNOSIS — H9193 Unspecified hearing loss, bilateral: Secondary | ICD-10-CM

## 2023-04-03 DIAGNOSIS — R002 Palpitations: Secondary | ICD-10-CM

## 2023-04-03 DIAGNOSIS — M4802 Spinal stenosis, cervical region: Secondary | ICD-10-CM | POA: Diagnosis not present

## 2023-04-03 DIAGNOSIS — R200 Anesthesia of skin: Secondary | ICD-10-CM | POA: Diagnosis not present

## 2023-04-03 DIAGNOSIS — M4319 Spondylolisthesis, multiple sites in spine: Secondary | ICD-10-CM | POA: Diagnosis not present

## 2023-04-03 DIAGNOSIS — M5412 Radiculopathy, cervical region: Secondary | ICD-10-CM | POA: Diagnosis not present

## 2023-04-03 DIAGNOSIS — M47813 Spondylosis without myelopathy or radiculopathy, cervicothoracic region: Secondary | ICD-10-CM | POA: Diagnosis not present

## 2023-04-03 DIAGNOSIS — R2689 Other abnormalities of gait and mobility: Secondary | ICD-10-CM

## 2023-04-03 NOTE — Progress Notes (Unsigned)
 EP to read.

## 2023-04-03 NOTE — Progress Notes (Signed)
 Subjective:  Kristi Avila is a 68 y.o. female who presents for Chief Complaint  Patient presents with   Consult    Neurosurgeron recommends a referral to cardiology due to recent heart racing x 3-5 years and possible TIAs. Having brain and neck CT scan today. Having neck and back pain- getting neck injections but hold left side is going numb neck down to foot. Having numbness on both hands, just had done right hand done with carpal tunnel     Here for concerns about numbness.  She has been seeing her neurosurgeon recently.  She has history of left arm and left leg numbness.  She also has history of both arm numbness.  She had carpal tunnel surgery on the right by Dr. Merlyn Lot within the past few months and they are talking about doing the other side as well on the left.  She had nerve conduction studies.  She is going today for a CT head and neck per neurosurgery.  They are concerned about a possible bulging disc in her neck.  The neurosurgeon was concerned that it some of her other numbness may not be all related to her neck.  That was the idea thrown out about TIA or doing some other evaluation.  She also notes that she has had palpitations a couple times a day.  She does not have necessarily chest pain or shortness of breath but the palpitations come fairly regularly and this has been going on for several months.  She thought she might need a referral to cardiology  No loss of consciousness.  No new slurred speech, no new visual change, no confusion, no right-sided concern currently, just left-sided arm and leg numbness that is worse at night in her sleep.  She is on gabapentin per neurosurgery which helps.  She does not feel weakness in her arms or legs.  She does have a history of unsteadiness on her feet.  She saw physical therapy prior referral not that long ago within the past few months but did not like the therapy experience.  She will not go back there.  No other aggravating or relieving  factors.    No other c/o.  Past Medical History:  Diagnosis Date   Arthritis    Barrett's esophagus with esophagitis 07/23/2012   EGD: CLO test results negative. pathology: Barretss: Repeat EGD 3 years   Blood transfusion without reported diagnosis    remote past   Cancer (HCC)    squamous cell CA- on legs   Depression    Family history of anesthesia complication    mother trouble waking up   Frequency of urination    GERD (gastroesophageal reflux disease)    h/o peptic ulcer   High cholesterol    HSV-2 infection    anal   Nocturia    Obesity    Overactive bladder    Reflux    Urgency of urination    UTI (urinary tract infection)    Current Outpatient Medications on File Prior to Visit  Medication Sig Dispense Refill   dexlansoprazole (DEXILANT) 60 MG capsule TAKE 1 CAPSULE(60 MG) BY MOUTH DAILY 90 capsule 3   DULoxetine (CYMBALTA) 60 MG capsule TAKE 1 CAPSULE BY MOUTH TWICE  DAILY 180 capsule 3   ezetimibe (ZETIA) 10 MG tablet TAKE 1 TABLET BY MOUTH DAILY 100 tablet 1   famotidine (PEPCID) 20 MG tablet Take 40 mg by mouth at bedtime.     gabapentin (NEURONTIN) 100 MG capsule TAKE UP  TO 3 CAPSULES BY MOUTH DAILY AS DIRECTED (Patient taking differently: 300 mg at bedtime. TAKE UP TO 3 CAPSULES BY MOUTH DAILY AS DIRECTED) 90 capsule 0   meloxicam (MOBIC) 15 MG tablet TAKE 1 TABLET(15 MG) BY MOUTH DAILY 90 tablet 0   MULTIPLE VITAMIN PO Take 1 tablet by mouth daily.     Polyethyl Glycol-Propyl Glycol (SYSTANE FREE OP) Apply to eye.     psyllium (METAMUCIL) 58.6 % powder Take by mouth. 2 teaspoons nightly     RESTASIS 0.05 % ophthalmic emulsion Place 1 drop into both eyes 2 (two) times daily.     rosuvastatin (CRESTOR) 5 MG tablet TAKE 1 TABLET(5 MG) BY MOUTH EVERY OTHER DAY 90 tablet 1   solifenacin (VESICARE) 10 MG tablet Take 1 tablet (10 mg total) by mouth daily. 90 tablet 0   valACYclovir (VALTREX) 500 MG tablet Take 1 tablet (500 mg total) by mouth daily. 90 tablet 2    ZINC-VITAMIN C PO Take 1 tablet by mouth daily in the afternoon.     No current facility-administered medications on file prior to visit.   Family History  Problem Relation Age of Onset   Heart disease Mother    Stroke Mother    Arthritis Mother    Pulmonary fibrosis Father        pulmonary fibrosis   Pulmonary disease Father    Cancer Sister        pancreatic   Diabetes Brother    Colon cancer Neg Hx    Esophageal cancer Neg Hx    Stomach cancer Neg Hx    Rectal cancer Neg Hx      The following portions of the patient's history were reviewed and updated as appropriate: allergies, current medications, past family history, past medical history, past social history, past surgical history and problem list.  ROS Otherwise as in subjective above    Objective: BP 122/80   Pulse 62   Wt 224 lb 12.8 oz (102 kg)   LMP 12/31/2019   BMI 36.28 kg/m   General appearance: alert, no distress, well developed, well nourished Neck: supple, no lymphadenopathy, no thyromegaly, no masses Heart: RRR, normal S1, S2, no murmurs Lungs: CTA bilaterally, no wheezes, rhonchi, or rales Pulses: 2+ radial pulses, 2+ pedal pulses, normal cap refill Ext: no edema Neuro: Some dullness to sensation of her left lateral lower leg but otherwise, CN II through XII intact, nonfocal exam    Assessment: Encounter Diagnoses  Name Primary?   Left sided numbness Yes   Palpitation    Balance problem    Bilateral hearing loss, unspecified hearing loss type    Wears hearing aid    Left arm numbness    Left leg numbness      Plan: We discussed her numbness concerns.  She has not had any other specific concerning TIA or stroke symptoms such as slurred speech, new onset visual or hearing changes but she has some chronic balance concerns and chronic hearing loss.  She goes for CT head and neck today per neurosurgery.  She is seeing neurosurgery for possible bulging disc in the neck  She attended  physical therapy for our referral not that long ago for balance issues but she had a bad experience at the therapy office.  She does not want to go back there.  Palpitations concerns ongoing for months-referral for long-term monitoring Zio patch  We discussed the possibility of sleep apnea.  We discussed possible doing a sleep study.  She wants to wait on that till after her scans today and think about it over the next several days.  Hearing loss-wears hearing aids  I reviewed the large panel labs she had done back in November 2024.  No additional labs at this time  We discussed that she may need other blood flow imaging such as carotid ultrasound or MRA head and neck going forward  Michala was seen today for consult.  Diagnoses and all orders for this visit:  Left sided numbness -     LONG TERM MONITOR (3-14 DAYS); Future  Palpitation -     LONG TERM MONITOR (3-14 DAYS); Future  Balance problem -     LONG TERM MONITOR (3-14 DAYS); Future  Bilateral hearing loss, unspecified hearing loss type  Wears hearing aid  Left arm numbness  Left leg numbness    Follow up: Pending eval

## 2023-04-11 DIAGNOSIS — M5412 Radiculopathy, cervical region: Secondary | ICD-10-CM | POA: Diagnosis not present

## 2023-04-11 DIAGNOSIS — M5416 Radiculopathy, lumbar region: Secondary | ICD-10-CM | POA: Diagnosis not present

## 2023-04-11 DIAGNOSIS — Z6836 Body mass index (BMI) 36.0-36.9, adult: Secondary | ICD-10-CM | POA: Diagnosis not present

## 2023-04-23 NOTE — Telephone Encounter (Signed)
 Scheduled patient for tomorrow, virtual.

## 2023-04-24 ENCOUNTER — Telehealth (INDEPENDENT_AMBULATORY_CARE_PROVIDER_SITE_OTHER): Admitting: Medical

## 2023-04-24 VITALS — Wt 220.0 lb

## 2023-04-24 DIAGNOSIS — U071 COVID-19: Secondary | ICD-10-CM

## 2023-04-24 MED ORDER — MOLNUPIRAVIR EUA 200MG CAPSULE: 4.0000 | ORAL_CAPSULE | Freq: Two times a day (BID) | ORAL | 0 refills | Status: AC

## 2023-04-24 NOTE — Progress Notes (Signed)
 Subjective:     Patient ID: Kristi Avila, female   DOB: November 09, 1955, 68 y.o.   MRN: [id number]  This visit type was conducted due to national recommendations for restrictions regarding the COVID-19 Pandemic (e.g. social distancing) in an effort to limit this patient's exposure and mitigate transmission in our community.  Due to their co-morbid illnesses, this patient is at least at moderate risk for complications without adequate follow up.  This format is felt to be most appropriate for this patient at this time.    Documentation for virtual audio and video telecommunications through [place] encounter:  The patient was located at home. The provider was located in the office. The patient did consent to this visit and is aware of possible charges through their insurance for this visit.  The other persons participating in this telemedicine service were none. Time spent on call was 20 minutes and in review of previous records 20 minutes total.  This virtual service is not related to other E/M service within previous 7 days.   HPI Chief Complaint  Patient presents with   Covid Positive    Positive Covid- tested positive yesterday. Symptoms start yesterday. Tired, achy,feet hurt, runny nose, no smell, no taste, throat hurts   Virtual consult for covid.  Tested positive yesterday.   She notes symptoms include fatigue, body aches, runny nose, sore throat, decrease senses of smell and taste.  Achiness is real bad, even bottom of feet.  No fever.  No SOB or wheezing.  No NVD.  Using sudafed and mucinex for symptoms.  She found out she had some sick contacts last week at the beach.  No other aggravating or relieving factors. No other complaint.   Past Medical History:  Diagnosis Date   Arthritis    Barrett's esophagus with esophagitis 07/23/2012   EGD: CLO test results negative. pathology: Barretss: Repeat EGD 3 years   Blood transfusion without reported diagnosis    remote past   Cancer  (HCC)    squamous cell CA- on legs   Depression    Family history of anesthesia complication    mother trouble waking up   Frequency of urination    GERD (gastroesophageal reflux disease)    h/o peptic ulcer   High cholesterol    HSV-2 infection    anal   Nocturia    Obesity    Overactive bladder    Reflux    Urgency of urination    UTI (urinary tract infection)    Current Outpatient Medications on File Prior to Visit  Medication Sig Dispense Refill   dexlansoprazole (DEXILANT) 60 MG capsule TAKE 1 CAPSULE(60 MG) BY MOUTH DAILY 90 capsule 3   DULoxetine (CYMBALTA) 60 MG capsule TAKE 1 CAPSULE BY MOUTH TWICE  DAILY 180 capsule 3   ezetimibe (ZETIA) 10 MG tablet TAKE 1 TABLET BY MOUTH DAILY 100 tablet 1   famotidine (PEPCID) 20 MG tablet Take 40 mg by mouth at bedtime.     gabapentin (NEURONTIN) 100 MG capsule TAKE UP TO 3 CAPSULES BY MOUTH DAILY AS DIRECTED (Patient taking differently: 300 mg at bedtime. TAKE UP TO 3 CAPSULES BY MOUTH DAILY AS DIRECTED) 90 capsule 0   meloxicam (MOBIC) 15 MG tablet TAKE 1 TABLET(15 MG) BY MOUTH DAILY 90 tablet 0   MULTIPLE VITAMIN PO Take 1 tablet by mouth daily.     psyllium (METAMUCIL) 58.6 % powder Take by mouth. 2 teaspoons nightly     RESTASIS 0.05 % ophthalmic emulsion Place  1 drop into both eyes 2 (two) times daily.     rosuvastatin (CRESTOR) 5 MG tablet TAKE 1 TABLET(5 MG) BY MOUTH EVERY OTHER DAY 90 tablet 1   solifenacin (VESICARE) 10 MG tablet Take 1 tablet (10 mg total) by mouth daily. 90 tablet 0   valACYclovir (VALTREX) 500 MG tablet Take 1 tablet (500 mg total) by mouth daily. 90 tablet 2   ZINC-VITAMIN C PO Take 1 tablet by mouth daily in the afternoon.     Polyethyl Glycol-Propyl Glycol (SYSTANE FREE OP) Apply to eye.     No current facility-administered medications on file prior to visit.    Review of Systems As in subjective    Objective:   Physical Exam Due to coronavirus pandemic stay at home measures, patient visit was  virtual and they were not examined in person.   Wt 220 lb (99.8 kg)   LMP 12/31/2019   BMI 35.51 kg/m   Gen: wd, wn nad Somewhat tired appearing but answers questions appropriately, No labored breathing or wheezing      Assessment:     Encounter Diagnosis  Name Primary?   COVID-19 virus infection Yes       Plan:     Covid infection, covid illness general recommendations:  I recommend you rest, hydrate well with water and clear fluids throughout the day.  If you feel dry in the mouth, tongue or feel that you are urinating as much as usual, then increase hydration.  You urine should be like yellow to clear, not dark yellow or darker.     Pain, body aches, or fever: You can use Tylenol /Acetaminophen 325mg  over the counter for pain or fever, every 4-6 hours    Cough: You can use over the counter Delsym for cough.  And or you can use Tessalon/Benzonatate prescription cough drops up to 3 times daily for worse cough not improved on Delsym and Mucinex DM.   Cough and congestion: Alternatively you can use over the counter Mucinex DM or Coricidin HBP for cough and congestion    Drainage and congestion: You can use over the counter antihistamine such as zyrtec, allegra, or benadryl as directed on the label   Nausea: You can use over the counter Emetrol for nausea.     Antiviral medication: Begin medication Molnupiravir medication to help reduce your risk of hospitalization or severe illness.  If medicaiton is not available, too expensive or not covered by insurance, then call us back.   Other supportive measures: I recommend extra vitamins to help your body fight the illness.   Consider over the counter vitamin pack such as EmergenC Immune Plus which contains extra vitamin C, vitamin D and zinc.     In the next few days, if you are having trouble breathing, if you are very weak, have high fever 103 or higher consistently despite Tylenol, or uncontrollable nausea and  vomiting, then call or go to the emergency department.    If you have other questions or have other symptoms or questions you are concerned about then please make a virtual visit   Covid symptoms such as fatigue and cough can linger over 2 weeks, even after the initial fever, aches, chills, and other initial symptoms.   Self Quarantine: The CDC, Centers for Disease Control has recommended a self quarantine of 5 days from the start of your illness until you are symptom-free including at least 24 hours of no symptoms including no fever, no shortness of breath, and no  body aches and chills, by day 5 before returning to work or general contact with the public.  What does self quarantine mean: avoiding contact with people as much as possible.   Particularly in your house, isolate your self from others in a separate room, wear a mask when possible in the room, particularly if coughing a lot.   Have others bring food, water, medications, etc., to your door, but avoid direct contact with your household contacts during this time to avoid spreading the infection to them.   If you have a separate bathroom and living quarters during the next 2 weeks away from others, that would be preferable.    If you can't completely isolate, then wear a mask, wash hands frequently with soap and water for at least 15 seconds, minimize close contact with others, and have a friend or family member check regularly from a distance to make sure you are not getting seriously worse.     You should not be going out in public, should not be going to stores, to work or other public places until all your symptoms have resolved and at least 5 days + 24 hours of no symptoms at all have transpired.   Ideally you should avoid contact with others for a full 5 days if possible.  One of the goals is to limit spread to high risk people; people that are older and elderly, people with multiple health issues like diabetes, heart disease, lung disease,  and anybody that has weakened immune systems such as people with cancer or on immunosuppressive therapy.      Arthi was seen today for covid positive.  Diagnoses and all orders for this visit:  COVID-19 virus infection  Other orders -     molnupiravir EUA (LAGEVRIO) 200 mg CAPS capsule; Take 4 capsules (800 mg total) by mouth 2 (two) times daily for 5 days.    F/u prn

## 2023-05-06 ENCOUNTER — Other Ambulatory Visit (HOSPITAL_COMMUNITY): Payer: Self-pay

## 2023-05-07 ENCOUNTER — Other Ambulatory Visit: Payer: Self-pay

## 2023-05-07 ENCOUNTER — Encounter (HOSPITAL_COMMUNITY): Payer: Self-pay

## 2023-05-07 ENCOUNTER — Other Ambulatory Visit (HOSPITAL_COMMUNITY): Payer: Self-pay

## 2023-05-07 DIAGNOSIS — R2689 Other abnormalities of gait and mobility: Secondary | ICD-10-CM | POA: Diagnosis not present

## 2023-05-07 DIAGNOSIS — R002 Palpitations: Secondary | ICD-10-CM | POA: Diagnosis not present

## 2023-05-09 DIAGNOSIS — R002 Palpitations: Secondary | ICD-10-CM | POA: Diagnosis not present

## 2023-05-09 DIAGNOSIS — R2689 Other abnormalities of gait and mobility: Secondary | ICD-10-CM

## 2023-05-09 DIAGNOSIS — R200 Anesthesia of skin: Secondary | ICD-10-CM | POA: Diagnosis not present

## 2023-05-17 ENCOUNTER — Ambulatory Visit: Admitting: Medical

## 2023-05-20 ENCOUNTER — Telehealth: Admitting: Medical

## 2023-05-29 ENCOUNTER — Telehealth: Admitting: Medical

## 2023-06-03 DIAGNOSIS — G5602 Carpal tunnel syndrome, left upper limb: Secondary | ICD-10-CM | POA: Diagnosis not present

## 2023-06-05 DIAGNOSIS — L821 Other seborrheic keratosis: Secondary | ICD-10-CM | POA: Diagnosis not present

## 2023-06-05 DIAGNOSIS — D485 Neoplasm of uncertain behavior of skin: Secondary | ICD-10-CM | POA: Diagnosis not present

## 2023-06-05 DIAGNOSIS — Z85828 Personal history of other malignant neoplasm of skin: Secondary | ICD-10-CM | POA: Diagnosis not present

## 2023-06-05 DIAGNOSIS — D225 Melanocytic nevi of trunk: Secondary | ICD-10-CM | POA: Diagnosis not present

## 2023-06-05 DIAGNOSIS — L570 Actinic keratosis: Secondary | ICD-10-CM | POA: Diagnosis not present

## 2023-06-06 DIAGNOSIS — G629 Polyneuropathy, unspecified: Secondary | ICD-10-CM | POA: Diagnosis not present

## 2023-06-06 DIAGNOSIS — M5416 Radiculopathy, lumbar region: Secondary | ICD-10-CM | POA: Diagnosis not present

## 2023-06-07 ENCOUNTER — Other Ambulatory Visit: Payer: Self-pay | Admitting: Orthopedic Surgery

## 2023-06-07 DIAGNOSIS — M1811 Unilateral primary osteoarthritis of first carpometacarpal joint, right hand: Secondary | ICD-10-CM | POA: Diagnosis not present

## 2023-06-07 DIAGNOSIS — G5602 Carpal tunnel syndrome, left upper limb: Secondary | ICD-10-CM | POA: Diagnosis not present

## 2023-06-07 DIAGNOSIS — G5601 Carpal tunnel syndrome, right upper limb: Secondary | ICD-10-CM | POA: Diagnosis not present

## 2023-06-12 ENCOUNTER — Other Ambulatory Visit (HOSPITAL_COMMUNITY): Payer: Self-pay

## 2023-06-12 ENCOUNTER — Other Ambulatory Visit: Payer: Self-pay | Admitting: Medical

## 2023-06-13 ENCOUNTER — Other Ambulatory Visit: Payer: Self-pay

## 2023-06-13 ENCOUNTER — Other Ambulatory Visit (HOSPITAL_COMMUNITY): Payer: Self-pay

## 2023-06-13 MED ORDER — MELOXICAM 15 MG PO TABS
15.0000 mg | ORAL_TABLET | Freq: Every day | ORAL | 0 refills | Status: DC
  Filled 2023-06-13: qty 90, 90d supply, fill #0

## 2023-06-13 MED FILL — Ezetimibe Tab 10 MG: ORAL | 100 days supply | Qty: 100 | Fill #0 | Status: AC

## 2023-06-20 ENCOUNTER — Telehealth: Payer: Self-pay | Admitting: Neurology

## 2023-06-20 DIAGNOSIS — G6289 Other specified polyneuropathies: Secondary | ICD-10-CM | POA: Diagnosis not present

## 2023-06-20 DIAGNOSIS — R202 Paresthesia of skin: Secondary | ICD-10-CM | POA: Diagnosis not present

## 2023-06-20 NOTE — Telephone Encounter (Signed)
 Inquiry about making an appointment

## 2023-06-21 ENCOUNTER — Other Ambulatory Visit: Payer: Self-pay

## 2023-06-21 ENCOUNTER — Encounter (HOSPITAL_BASED_OUTPATIENT_CLINIC_OR_DEPARTMENT_OTHER): Payer: Self-pay | Admitting: Orthopedic Surgery

## 2023-06-25 ENCOUNTER — Ambulatory Visit (HOSPITAL_BASED_OUTPATIENT_CLINIC_OR_DEPARTMENT_OTHER): Admitting: Anesthesiology

## 2023-06-25 ENCOUNTER — Ambulatory Visit (HOSPITAL_BASED_OUTPATIENT_CLINIC_OR_DEPARTMENT_OTHER)
Admission: RE | Admit: 2023-06-25 | Discharge: 2023-06-25 | Disposition: A | Discharge status: Home / Self Care | Attending: Orthopedic Surgery | Admitting: Orthopedic Surgery

## 2023-06-25 ENCOUNTER — Encounter (HOSPITAL_BASED_OUTPATIENT_CLINIC_OR_DEPARTMENT_OTHER)
Admission: RE | Disposition: A | Discharge status: Home / Self Care | Payer: Self-pay | Source: Home / Self Care | Attending: Orthopedic Surgery

## 2023-06-25 ENCOUNTER — Encounter (HOSPITAL_BASED_OUTPATIENT_CLINIC_OR_DEPARTMENT_OTHER): Payer: Self-pay | Admitting: Orthopedic Surgery

## 2023-06-25 ENCOUNTER — Other Ambulatory Visit: Payer: Self-pay

## 2023-06-25 DIAGNOSIS — G5602 Carpal tunnel syndrome, left upper limb: Secondary | ICD-10-CM | POA: Insufficient documentation

## 2023-06-25 DIAGNOSIS — M1990 Unspecified osteoarthritis, unspecified site: Secondary | ICD-10-CM | POA: Insufficient documentation

## 2023-06-25 DIAGNOSIS — Z6836 Body mass index (BMI) 36.0-36.9, adult: Secondary | ICD-10-CM | POA: Diagnosis not present

## 2023-06-25 DIAGNOSIS — E669 Obesity, unspecified: Secondary | ICD-10-CM | POA: Diagnosis not present

## 2023-06-25 HISTORY — PX: CARPAL TUNNEL RELEASE: SHX101

## 2023-06-25 SURGERY — CARPAL TUNNEL RELEASE
Anesthesia: General | Site: Wrist | Laterality: Left

## 2023-06-25 MED ORDER — DEXAMETHASONE SODIUM PHOSPHATE 10 MG/ML IJ SOLN
INTRAMUSCULAR | Status: AC
  Filled 2023-06-25: qty 1

## 2023-06-25 MED ORDER — FENTANYL CITRATE (PF) 100 MCG/2ML IJ SOLN: 25.0000 ug | INTRAMUSCULAR | Status: DC | PRN

## 2023-06-25 MED ORDER — 0.9 % SODIUM CHLORIDE (POUR BTL) OPTIME
TOPICAL | Status: DC | PRN
  Administered 2023-06-25: 1000 mL

## 2023-06-25 MED ORDER — OXYCODONE HCL 5 MG PO TABS: 5.0000 mg | ORAL_TABLET | Freq: Once | ORAL | Status: DC | PRN

## 2023-06-25 MED ORDER — HYDROCODONE-ACETAMINOPHEN 5-325 MG PO TABS: 1.0000 | ORAL_TABLET | Freq: Four times a day (QID) | ORAL | 0 refills | Status: DC | PRN

## 2023-06-25 MED ORDER — ONDANSETRON HCL 4 MG/2ML IJ SOLN
INTRAMUSCULAR | Status: AC
  Filled 2023-06-25: qty 2

## 2023-06-25 MED ORDER — LACTATED RINGERS IV SOLN: INTRAVENOUS | Status: DC

## 2023-06-25 MED ORDER — PROPOFOL 10 MG/ML IV BOLUS
INTRAVENOUS | Status: DC | PRN
  Administered 2023-06-25: 200 mg via INTRAVENOUS
  Administered 2023-06-25: 100 mg via INTRAVENOUS

## 2023-06-25 MED ORDER — BUPIVACAINE HCL (PF) 0.25 % IJ SOLN
INTRAMUSCULAR | Status: DC | PRN
  Administered 2023-06-25: 9 mL

## 2023-06-25 MED ORDER — MIDAZOLAM HCL 5 MG/5ML IJ SOLN
INTRAMUSCULAR | Status: DC | PRN
  Administered 2023-06-25: 2 mg via INTRAVENOUS

## 2023-06-25 MED ORDER — LIDOCAINE HCL (CARDIAC) PF 100 MG/5ML IV SOSY
PREFILLED_SYRINGE | INTRAVENOUS | Status: DC | PRN
  Administered 2023-06-25: 60 mg via INTRAVENOUS

## 2023-06-25 MED ORDER — MIDAZOLAM HCL 2 MG/2ML IJ SOLN
INTRAMUSCULAR | Status: AC
Start: 2023-06-25 — End: ?
  Filled 2023-06-25: qty 2

## 2023-06-25 MED ORDER — VANCOMYCIN HCL IN DEXTROSE 1-5 GM/200ML-% IV SOLN
1000.0000 mg | INTRAVENOUS | Status: AC
  Administered 2023-06-25: 1000 mg via INTRAVENOUS

## 2023-06-25 MED ORDER — ONDANSETRON HCL 4 MG/2ML IJ SOLN
INTRAMUSCULAR | Status: DC | PRN
  Administered 2023-06-25: 4 mg via INTRAVENOUS

## 2023-06-25 MED ORDER — ONDANSETRON HCL 4 MG/2ML IJ SOLN: 4.0000 mg | Freq: Four times a day (QID) | INTRAMUSCULAR | Status: DC | PRN

## 2023-06-25 MED ORDER — LIDOCAINE 2% (20 MG/ML) 5 ML SYRINGE
INTRAMUSCULAR | Status: DC | PRN
  Administered 2023-06-25: 60 mg via INTRAVENOUS

## 2023-06-25 MED ORDER — FENTANYL CITRATE (PF) 100 MCG/2ML IJ SOLN
INTRAMUSCULAR | Status: AC
Start: 2023-06-25 — End: ?
  Filled 2023-06-25: qty 2

## 2023-06-25 MED ORDER — PROPOFOL 10 MG/ML IV BOLUS
INTRAVENOUS | Status: AC
  Filled 2023-06-25: qty 20

## 2023-06-25 MED ORDER — FENTANYL CITRATE (PF) 100 MCG/2ML IJ SOLN
INTRAMUSCULAR | Status: DC | PRN
  Administered 2023-06-25: 25 ug via INTRAVENOUS
  Administered 2023-06-25: 50 ug via INTRAVENOUS
  Administered 2023-06-25: 25 ug via INTRAVENOUS

## 2023-06-25 MED ORDER — DEXAMETHASONE SODIUM PHOSPHATE 4 MG/ML IJ SOLN
INTRAMUSCULAR | Status: DC | PRN
  Administered 2023-06-25: 5 mg via INTRAVENOUS

## 2023-06-25 MED ORDER — OXYCODONE HCL 5 MG/5ML PO SOLN: 5.0000 mg | Freq: Once | ORAL | Status: DC | PRN

## 2023-06-25 MED ORDER — LEVOFLOXACIN IN D5W 500 MG/100ML IV SOLN
500.0000 mg | INTRAVENOUS | Status: AC
  Administered 2023-06-25: 500 mg via INTRAVENOUS

## 2023-06-25 MED ORDER — VANCOMYCIN HCL IN DEXTROSE 1-5 GM/200ML-% IV SOLN
INTRAVENOUS | Status: AC
  Filled 2023-06-25: qty 200

## 2023-06-25 MED ORDER — LEVOFLOXACIN IN D5W 500 MG/100ML IV SOLN
INTRAVENOUS | Status: AC
  Filled 2023-06-25: qty 100

## 2023-06-25 SURGICAL SUPPLY — 29 items
BLADE SURG 15 STRL LF DISP TIS (BLADE) ×2 IMPLANT
BNDG ELASTIC 3INX 5YD STR LF (GAUZE/BANDAGES/DRESSINGS) ×1 IMPLANT
BNDG ESMARK 4X9 LF (GAUZE/BANDAGES/DRESSINGS) IMPLANT
BNDG GAUZE DERMACEA FLUFF 4 (GAUZE/BANDAGES/DRESSINGS) ×1 IMPLANT
CHLORAPREP W/TINT 26 (MISCELLANEOUS) ×1 IMPLANT
CORD BIPOLAR FORCEPS 12FT (ELECTRODE) ×1 IMPLANT
COVER BACK TABLE 60X90IN (DRAPES) ×1 IMPLANT
COVER MAYO STAND STRL (DRAPES) ×1 IMPLANT
CUFF TOURN SGL QUICK 18X4 (TOURNIQUET CUFF) ×1 IMPLANT
DRAPE EXTREMITY T 121X128X90 (DISPOSABLE) ×1 IMPLANT
DRAPE SURG 17X23 STRL (DRAPES) ×1 IMPLANT
GAUZE PAD ABD 8X10 STRL (GAUZE/BANDAGES/DRESSINGS) ×1 IMPLANT
GAUZE SPONGE 4X4 12PLY STRL (GAUZE/BANDAGES/DRESSINGS) ×1 IMPLANT
GAUZE XEROFORM 1X8 LF (GAUZE/BANDAGES/DRESSINGS) ×1 IMPLANT
GLOVE BIO SURGEON STRL SZ7.5 (GLOVE) ×1 IMPLANT
GLOVE BIOGEL PI IND STRL 8 (GLOVE) ×1 IMPLANT
GOWN STRL REUS W/ TWL LRG LVL3 (GOWN DISPOSABLE) ×1 IMPLANT
GOWN STRL REUS W/TWL XL LVL3 (GOWN DISPOSABLE) ×1 IMPLANT
NDL HYPO 25X1 1.5 SAFETY (NEEDLE) ×1 IMPLANT
NEEDLE HYPO 25X1 1.5 SAFETY (NEEDLE) ×1 IMPLANT
NS IRRIG 1000ML POUR BTL (IV SOLUTION) ×1 IMPLANT
PACK BASIN DAY SURGERY FS (CUSTOM PROCEDURE TRAY) ×1 IMPLANT
PADDING CAST ABS COTTON 4X4 ST (CAST SUPPLIES) ×1 IMPLANT
STOCKINETTE 4X48 STRL (DRAPES) ×1 IMPLANT
SUT ETHILON 4 0 PS 2 18 (SUTURE) ×1 IMPLANT
SYR BULB EAR ULCER 3OZ GRN STR (SYRINGE) ×1 IMPLANT
SYR CONTROL 10ML LL (SYRINGE) ×1 IMPLANT
TOWEL GREEN STERILE FF (TOWEL DISPOSABLE) ×2 IMPLANT
UNDERPAD 30X36 HEAVY ABSORB (UNDERPADS AND DIAPERS) ×1 IMPLANT

## 2023-06-25 NOTE — Discharge Instructions (Addendum)

## 2023-06-25 NOTE — Anesthesia Preprocedure Evaluation (Signed)
 Anesthesia Evaluation  Patient identified by MRN, date of birth, ID band Patient awake    Reviewed: Allergy & Precautions, H&P , NPO status , Patient's Chart, lab work & pertinent test results  Airway Mallampati: II   Neck ROM: full    Dental   Pulmonary neg pulmonary ROS   breath sounds clear to auscultation       Cardiovascular negative cardio ROS  Rhythm:regular Rate:Normal     Neuro/Psych  PSYCHIATRIC DISORDERS Anxiety Depression       GI/Hepatic ,GERD  ,,  Endo/Other    Renal/GU      Musculoskeletal  (+) Arthritis ,    Abdominal   Peds  Hematology   Anesthesia Other Findings   Reproductive/Obstetrics                             Anesthesia Physical Anesthesia Plan  ASA: 2  Anesthesia Plan: General   Post-op Pain Management:    Induction: Intravenous  PONV Risk Score and Plan: 3 and Ondansetron , Dexamethasone  and Treatment may vary due to age or medical condition  Airway Management Planned: LMA  Additional Equipment:   Intra-op Plan:   Post-operative Plan: Extubation in OR  Informed Consent: I have reviewed the patients History and Physical, chart, labs and discussed the procedure including the risks, benefits and alternatives for the proposed anesthesia with the patient or authorized representative who has indicated his/her understanding and acceptance.     Dental advisory given  Plan Discussed with: CRNA, Anesthesiologist and Surgeon  Anesthesia Plan Comments:        Anesthesia Quick Evaluation

## 2023-06-25 NOTE — H&P (Signed)
 Kristi Avila is an 68 y.o. female.   Chief Complaint: carpal tunnel syndrome HPI: 68 y.o. yo female with numbness and tingling left hand.  Positive nerve conduction studies. She wishes to have left carpal tunnel release.   Allergies:  Allergies  Allergen Reactions   Cephalosporins Diarrhea   Codeine Itching   Latex Itching   Pravastatin      Myalgias   Simvastatin     myalgias   Sulfa Antibiotics Diarrhea    Past Medical History:  Diagnosis Date   Arthritis    Barrett's esophagus with esophagitis 07/23/2012   EGD: CLO test results negative. pathology: Barretss: Repeat EGD 3 years   Blood transfusion without reported diagnosis    remote past   Cancer (HCC)    squamous cell CA- on legs   Depression    Family history of anesthesia complication    mother trouble waking up   Frequency of urination    GERD (gastroesophageal reflux disease)    h/o peptic ulcer   High cholesterol    HSV-2 infection    anal   Nocturia    Obesity    Overactive bladder    Reflux    Urgency of urination    UTI (urinary tract infection)     Past Surgical History:  Procedure Laterality Date   ARTHROSCOPY KNEE W/ DRILLING     X4 ON RT KNEE   CARPAL TUNNEL RELEASE Right 2000   CARPAL TUNNEL RELEASE Right 03/12/2023   Procedure: RIGHT RECURRENT CARPAL TUNNEL RELEASE;  Surgeon: Brunilda Capra, MD;  Location: [facility];  Service: Orthopedics;  Laterality: Right;  60 MIN   COLONOSCOPY  2017   repeat 2027   colonscopy  2007, 2017   ESOPHAGOGASTRODUODENOSCOPY  2007   Dr. Onesimo Bijou, [facility]   foot fibroma surgery  all before 1985   x 3   HEMORRHOID SURGERY  04/2006   HIP FRACTURE SURGERY Right 2018   KENALOG  INJECTION Bilateral 03/12/2023   Procedure: KENALOG  INJECTION;  Surgeon: Brunilda Capra, MD;  Location: [facility];  Service: Orthopedics;  Laterality: Bilateral;   KNEE ARTHROSCOPY     X2 LEFT KNEE   LIPOMA EXCISION  11/2020   SINUS EXPLORATION  1995   TOE  AMPUTATION  1959   TOTAL HIP ARTHROPLASTY Left 2022   TOTAL KNEE ARTHROPLASTY Right 09/22/2012   Procedure: RIGHT TOTAL KNEE ARTHROPLASTY;  Surgeon: Aurther Blue, MD;  Location: [facility];  Service: Orthopedics;  Laterality: Right;   TOTAL KNEE ARTHROPLASTY Left 09/28/2013   Procedure: LEFT TOTAL KNEE ARTHROPLASTY, CORTISONE INJECTION RIGHT HIP;  Surgeon: Aurther Blue, MD;  Location: [facility];  Service: Orthopedics;  Laterality: Left;   UPPER GASTROINTESTINAL ENDOSCOPY      Family History: Family History  Problem Relation Age of Onset   Heart disease Mother    Stroke Mother    Arthritis Mother    Pulmonary fibrosis Father        pulmonary fibrosis   Pulmonary disease Father    Cancer Sister        pancreatic   Diabetes Brother    Colon cancer Neg Hx    Esophageal cancer Neg Hx    Stomach cancer Neg Hx    Rectal cancer Neg Hx     Social History:   reports that she has never smoked. She has never used smokeless tobacco. She reports current alcohol use of about 2.0 standard drinks of alcohol per week. She reports that  she does not use drugs.  Medications: Medications Prior to Admission  Medication Sig Dispense Refill   dexlansoprazole  (DEXILANT ) 60 MG capsule TAKE 1 CAPSULE(60 MG) BY MOUTH DAILY 90 capsule 3   DULoxetine  (CYMBALTA ) 60 MG capsule TAKE 1 CAPSULE BY MOUTH TWICE  DAILY 180 capsule 3   ezetimibe  (ZETIA ) 10 MG tablet Take 1 tablet (10 mg total) by mouth daily. 100 tablet 1   famotidine  (PEPCID ) 20 MG tablet Take 40 mg by mouth at bedtime.     gabapentin  (NEURONTIN ) 100 MG capsule TAKE UP TO 3 CAPSULES BY MOUTH DAILY AS DIRECTED (Patient taking differently: 300 mg at bedtime. TAKE UP TO 3 CAPSULES BY MOUTH DAILY AS DIRECTED) 90 capsule 0   meloxicam  (MOBIC ) 15 MG tablet Take 1 tablet (15 mg total) by mouth daily. 90 tablet 0   MULTIPLE VITAMIN PO Take 1 tablet by mouth daily.     Polyethyl Glycol-Propyl Glycol (SYSTANE FREE OP) Apply to eye.     psyllium (METAMUCIL)  58.6 % powder Take by mouth. 2 teaspoons nightly     RESTASIS 0.05 % ophthalmic emulsion Place 1 drop into both eyes 2 (two) times daily.     rosuvastatin  (CRESTOR ) 5 MG tablet TAKE 1 TABLET(5 MG) BY MOUTH EVERY OTHER DAY 90 tablet 1   solifenacin  (VESICARE ) 10 MG tablet Take 1 tablet (10 mg total) by mouth daily. 90 tablet 0   valACYclovir  (VALTREX ) 500 MG tablet Take 1 tablet (500 mg total) by mouth daily. 90 tablet 2   ZINC-VITAMIN C PO Take 1 tablet by mouth daily in the afternoon.      No results found for this or any previous visit (from the past 48 hours).  No results found.    Blood pressure 127/76, pulse 73, temperature 97.9 F (36.6 C), temperature source Temporal, resp. rate 18, height 5\' 6"  (1.676 m), weight 101.2 kg, last menstrual period 12/31/2019, SpO2 96%.  General appearance: alert, cooperative, and appears stated age Head: Normocephalic, without obvious abnormality, atraumatic Neck: supple, symmetrical, trachea midline Extremities: Intact sensation and capillary refill all digits.  +epl/fpl/io.  No wounds.  Skin: Skin color, texture, turgor normal. No rashes or lesions Neurologic: Grossly normal Incision/Wound: none  Assessment/Plan Left carpal tunnel syndrome.  Non operative and operative treatment options have been discussed with the patient and patient wishes to proceed with operative treatment. Risks, benefits and alternatives of surgery were discussed including risks of blood loss, infection, damage to nerves/vessels/tendons/ligament/bone, failure of surgery, need for additional surgery, complication with wound healing, stiffness, damage to motor branch, recurrence.  She voiced understanding of these risks and elected to proceed.    Kristi Avila 06/25/2023, 12:42 PM

## 2023-06-25 NOTE — Transfer of Care (Signed)
 Immediate Anesthesia Transfer of Care Note  Patient: Kristi Avila  Procedure(s) Performed: Procedure(s) (LRB): CARPAL TUNNEL RELEASE (Left)  Patient Location: PACU  Anesthesia Type: GA  Level of Consciousness: awake, sedated, patient cooperative and responds to stimulation, sleepy stable   Airway & Oxygen Therapy: Patient Spontanous Breathing and Patient connected to [place] oxygen  Post-op Assessment: Report given to PACU RN, Post -op Vital signs reviewed and stable and Patient sleepy   Post vital signs: Reviewed and stable  Complications: No apparent anesthesia complications

## 2023-06-25 NOTE — Anesthesia Procedure Notes (Signed)
 Procedure Name: LMA Insertion Date/Time: 06/25/2023 12:54 PM  Performed by: Lucky Sable, CRNAPre-anesthesia Checklist: Patient identified, Emergency Drugs available, Suction available, Patient being monitored and Timeout performed Patient Re-evaluated:Patient Re-evaluated prior to induction Oxygen Delivery Method: Circle system utilized Preoxygenation: Pre-oxygenation with 100% oxygen Induction Type: IV induction Ventilation: Mask ventilation without difficulty LMA: LMA inserted LMA Size: 4.0 Number of attempts: 1 Airway Equipment and Method: Bite block Placement Confirmation: positive ETCO2, breath sounds checked- equal and bilateral and CO2 detector Tube secured with: Tape Dental Injury: Teeth and Oropharynx as per pre-operative assessment

## 2023-06-25 NOTE — Op Note (Signed)
 06/25/2023 [name]                              OPERATIVE REPORT   PREOPERATIVE DIAGNOSIS:  Left carpal tunnel syndrome  POSTOPERATIVE DIAGNOSIS:  Left carpal tunnel syndrome  PROCEDURE:  Left carpal tunnel release  SURGEON:  Brunilda Capra, MD  ASSISTANT:  none.  ANESTHESIA: General  IV FLUIDS:  Per anesthesia flow sheet  ESTIMATED BLOOD LOSS:  Minimal  COMPLICATIONS:  None  SPECIMENS:  None  TOURNIQUET TIME:    Total Tourniquet Time Documented: Upper Arm (Left) - 10 minutes Total: Upper Arm (Left) - 10 minutes   DISPOSITION:  Stable to PACU  LOCATION: [facility]  INDICATIONS:  68 y.o. yo female with numbness and tingling left hand.  Positive nerve conduction studies. She wishes to proceed with left carpal tunnel release.  Risks, benefits and alternatives of surgery were discussed including the risk of blood loss; infection; damage to nerves, vessels, tendons, ligaments, bone; failure of surgery; need for additional surgery; complications with wound healing; continued pain; recurrence of carpal tunnel syndrome; and damage to motor branch. She voiced understanding of these risks and elected to proceed.   OPERATIVE COURSE:  After being identified preoperatively by myself, the patient and I agreed upon the procedure and site of procedure.  The surgical site was marked.  Surgical consent had been signed.  She was given IV Ancef as preoperative antibiotic prophylaxis.  She was transferred to the operating room and placed on the operating room table in supine position with the Left upper extremity on an armboard.  General anesthesia was induced by the anesthesiologist.  Left upper extremity was prepped and draped in normal sterile orthopaedic fashion.  A surgical pause was performed between the surgeons, anesthesia, and operating room staff, and all were in agreement as to the patient, procedure, and site of procedure.  Tourniquet at the proximal aspect  of the extremity was inflated to 250 mmHg after exsanguination of the arm with an Esmarch bandage  Incision was made over the transverse carpal ligament and carried into the subcutaneous tissues by spreading technique.  Bipolar electrocautery was used to obtain hemostasis.  The palmar fascia was sharply incised.  The transverse carpal ligament was identified.  The fascia distal to the ligament was opened.  Retractor was placed and the flexor tendons were identified.  The flexor tendon to the ring finger was identified and retracted radially.  The transverse carpal ligament was then incised from distal to proximal under direct visualization.  Scissors were used to split the distal aspect of the volar antebrachial fascia.  A finger was placed into the wound to ensure complete decompression, which was the case.  The nerve was examined.  It was flattened and hyperemic.  The motor branch was identified and was intact.  The wound was copiously irrigated with sterile saline.  It was then closed with 4-0 nylon in a horizontal mattress fashion.  It was injected with 0.25% plain Marcaine  to aid in postoperative analgesia.  It was dressed with sterile Xeroform, 4x4s, an ABD, and wrapped with Kerlix and an Ace bandage.  Tourniquet was deflated at 10 minutes.  Fingertips were pink with brisk capillary refill after deflation of the tourniquet.  Operative drapes were broken down.  The patient was awoken from anesthesia safely.  She was transferred back to stretcher and taken to the PACU in stable condition.  I will  see her back in the office in 1 week for postoperative followup.  I will give her a prescription for Norco 5/325 1 tab PO q6 hours prn pain, dispense #15.    Kristi Zirkle, MD Electronically signed, 06/25/23

## 2023-06-26 ENCOUNTER — Encounter (HOSPITAL_BASED_OUTPATIENT_CLINIC_OR_DEPARTMENT_OTHER): Payer: Self-pay | Admitting: Orthopedic Surgery

## 2023-06-26 NOTE — Anesthesia Postprocedure Evaluation (Signed)
 Anesthesia Post Note  Patient: Kristi Avila  Procedure(s) Performed: CARPAL TUNNEL RELEASE (Left: Wrist)     Patient location during evaluation: PACU Anesthesia Type: General Level of consciousness: awake and alert Pain management: pain level controlled Vital Signs Assessment: post-procedure vital signs reviewed and stable Respiratory status: spontaneous breathing, nonlabored ventilation, respiratory function stable and patient connected to nasal cannula oxygen Cardiovascular status: blood pressure returned to baseline and stable Postop Assessment: no apparent nausea or vomiting Anesthetic complications: no   No notable events documented.  Last Vitals:  Vitals:   06/25/23 1345 06/25/23 1427  BP: 123/68 (!) 137/56  Pulse: 65 67  Resp: 12 15  Temp:  36.6 C  SpO2: 95% 95%    Last Pain:  Vitals:   06/25/23 1427  TempSrc: Temporal  PainSc:                  Zacharias Ridling S

## 2023-06-27 DIAGNOSIS — M7062 Trochanteric bursitis, left hip: Secondary | ICD-10-CM | POA: Diagnosis not present

## 2023-06-27 DIAGNOSIS — Z96643 Presence of artificial hip joint, bilateral: Secondary | ICD-10-CM | POA: Diagnosis not present

## 2023-06-27 DIAGNOSIS — Z96653 Presence of artificial knee joint, bilateral: Secondary | ICD-10-CM | POA: Diagnosis not present

## 2023-07-03 ENCOUNTER — Other Ambulatory Visit: Payer: Self-pay | Admitting: Medical

## 2023-07-04 ENCOUNTER — Other Ambulatory Visit (HOSPITAL_COMMUNITY): Payer: Self-pay

## 2023-07-04 ENCOUNTER — Other Ambulatory Visit: Payer: Self-pay

## 2023-07-04 MED ORDER — SOLIFENACIN SUCCINATE 10 MG PO TABS
10.0000 mg | ORAL_TABLET | Freq: Every day | ORAL | 0 refills | Status: DC
  Filled 2023-07-04: qty 90, 90d supply, fill #0

## 2023-07-08 ENCOUNTER — Encounter: Payer: Self-pay | Admitting: Medical

## 2023-07-08 ENCOUNTER — Ambulatory Visit: Admitting: Medical

## 2023-07-08 VITALS — BP 112/76 | HR 81 | Ht 66.0 in | Wt 228.4 lb

## 2023-07-08 DIAGNOSIS — F419 Anxiety disorder, unspecified: Secondary | ICD-10-CM | POA: Diagnosis not present

## 2023-07-08 DIAGNOSIS — F32A Depression, unspecified: Secondary | ICD-10-CM

## 2023-07-08 DIAGNOSIS — Z136 Encounter for screening for cardiovascular disorders: Secondary | ICD-10-CM | POA: Diagnosis not present

## 2023-07-08 DIAGNOSIS — R0789 Other chest pain: Secondary | ICD-10-CM | POA: Diagnosis not present

## 2023-07-08 DIAGNOSIS — R002 Palpitations: Secondary | ICD-10-CM

## 2023-07-08 DIAGNOSIS — R2689 Other abnormalities of gait and mobility: Secondary | ICD-10-CM | POA: Diagnosis not present

## 2023-07-08 DIAGNOSIS — R0609 Other forms of dyspnea: Secondary | ICD-10-CM

## 2023-07-08 MED ORDER — PROPRANOLOL HCL 20 MG PO TABS: 20.0000 mg | ORAL_TABLET | Freq: Two times a day (BID) | ORAL | 0 refills | Status: DC

## 2023-07-08 NOTE — Progress Notes (Signed)
 Subjective:  Kristi Avila is a 68 y.o. female who presents for Chief Complaint  Patient presents with   other    Follow up on heart health-still having issues with racing heart in the middle     Here for follow-up.  I saw her back in March for several concerns including palpitations.  She is still getting the palpitations that are brief.  She gets a few every day.  She has not been back since March because she has been helping her mother.  Has been taking care of mother.  Her mother fell and broke a hip.  Her boyfriend sees Dr. Lavona.  She was curious about seeing Dr. Lavona in consult as well.  In addition to the palpitations she does get a little winded with activity or some chest discomfort.  She does not exercise regularly but when she is active she can experience some chest pressure.  Last visit we referred her to physical therapy for unsteadiness on her feet and balance.  She went once but got irritated with them as they were really working with multiple clients at the same time and they left her by herself longer than she was expecting  No other aggravating or relieving factors.    No other c/o.  Past Medical History:  Diagnosis Date   Arthritis    Barrett's esophagus with esophagitis 07/23/2012   EGD: CLO test results negative. pathology: Barretss: Repeat EGD 3 years   Blood transfusion without reported diagnosis    remote past   Cancer (HCC)    squamous cell CA- on legs   Depression    Family history of anesthesia complication    mother trouble waking up   Frequency of urination    GERD (gastroesophageal reflux disease)    h/o peptic ulcer   High cholesterol    HSV-2 infection    anal   Nocturia    Obesity    Overactive bladder    Reflux    Urgency of urination    UTI (urinary tract infection)    Current Outpatient Medications on File Prior to Visit  Medication Sig Dispense Refill   dexlansoprazole  (DEXILANT ) 60 MG capsule TAKE 1 CAPSULE(60 MG) BY MOUTH  DAILY 90 capsule 3   DULoxetine  (CYMBALTA ) 60 MG capsule TAKE 1 CAPSULE BY MOUTH TWICE  DAILY 180 capsule 3   ezetimibe  (ZETIA ) 10 MG tablet Take 1 tablet (10 mg total) by mouth daily. 100 tablet 1   famotidine  (PEPCID ) 20 MG tablet Take 40 mg by mouth at bedtime.     meloxicam  (MOBIC ) 15 MG tablet Take 1 tablet (15 mg total) by mouth daily. 90 tablet 0   MULTIPLE VITAMIN PO Take 1 tablet by mouth daily.     Polyethyl Glycol-Propyl Glycol (SYSTANE FREE OP) Apply to eye.     pregabalin (LYRICA) 100 MG capsule Take 100 mg by mouth in the morning, at noon, and at bedtime.     psyllium (METAMUCIL) 58.6 % powder Take by mouth. 2 teaspoons nightly     RESTASIS 0.05 % ophthalmic emulsion Place 1 drop into both eyes 2 (two) times daily.     rosuvastatin  (CRESTOR ) 5 MG tablet TAKE 1 TABLET(5 MG) BY MOUTH EVERY OTHER DAY 90 tablet 1   solifenacin  (VESICARE ) 10 MG tablet Take 1 tablet (10 mg total) by mouth daily. 90 tablet 0   valACYclovir  (VALTREX ) 500 MG tablet Take 1 tablet (500 mg total) by mouth daily. 90 tablet 2   ZINC-VITAMIN C  PO Take 1 tablet by mouth daily in the afternoon.     No current facility-administered medications on file prior to visit.   Family History  Problem Relation Age of Onset   Heart disease Mother    Stroke Mother    Arthritis Mother    Pulmonary fibrosis Father        pulmonary fibrosis   Pulmonary disease Father    Cancer Sister        pancreatic   Diabetes Brother    Colon cancer Neg Hx    Esophageal cancer Neg Hx    Stomach cancer Neg Hx    Rectal cancer Neg Hx      The following portions of the patient's history were reviewed and updated as appropriate: allergies, current medications, past family history, past medical history, past social history, past surgical history and problem list.  ROS Otherwise as in subjective above    Objective: BP 112/76 (Cuff Size: Large)   Pulse 81   Ht 5' 6 (1.676 m)   Wt 228 lb 6.4 oz (103.6 kg)   LMP 12/31/2019    SpO2 98%   BMI 36.86 kg/m   BP Readings from Last 3 Encounters:  07/08/23 112/76  06/25/23 (!) 137/56  04/03/23 122/80    General appearance: alert, no distress, well developed, well nourished Heart: RRR, normal S1, S2, no murmurs Lungs: CTA bilaterally, no wheezes, rhonchi, or rales Pulses: 2+ radial pulses, 2+ pedal pulses, normal cap refill Ext: no edema    Assessment: Encounter Diagnoses  Name Primary?   DOE (dyspnea on exertion) Yes   Palpitation    Screening for heart disease    Anxiety and depression    Balance problem    Chest pressure       Plan: We reviewed over her ZIO monitor that was done back in April 2025.  There was a few runs of SVT that were brief.  She still is having some palpitations.  Will begin a trial of propranolol low-dose.  I gave her the option of using 1/2 tablet or a whole tablet twice daily.  Avoid or limit caffeine and alcohol.  Reduce stress where possible  Given that she does get some chest discomfort and dyspnea on exertion we will refer to cardiology for consult  Anxiety-continue Cymbalta  60 mg twice daily.  Advise counseling  Balance concerns-she wants to hold off of physical therapy at this time  Hyperlipidemia-continue Zetia  10 mg daily, continue Crestor  5 mg every other day.   Kristi Avila was seen today for other.  Diagnoses and all orders for this visit:  DOE (dyspnea on exertion) -     Ambulatory referral to Cardiology  Palpitation -     Ambulatory referral to Cardiology  Screening for heart disease -     Ambulatory referral to Cardiology  Anxiety and depression  Balance problem  Chest pressure  Other orders -     propranolol (INDERAL) 20 MG tablet; Take 1 tablet (20 mg total) by mouth 2 (two) times daily.    Follow up: Pending cardiology consult

## 2023-07-29 DIAGNOSIS — Z85828 Personal history of other malignant neoplasm of skin: Secondary | ICD-10-CM | POA: Diagnosis not present

## 2023-07-29 DIAGNOSIS — L570 Actinic keratosis: Secondary | ICD-10-CM | POA: Diagnosis not present

## 2023-07-29 DIAGNOSIS — L821 Other seborrheic keratosis: Secondary | ICD-10-CM | POA: Diagnosis not present

## 2023-07-29 DIAGNOSIS — L820 Inflamed seborrheic keratosis: Secondary | ICD-10-CM | POA: Diagnosis not present

## 2023-07-30 ENCOUNTER — Other Ambulatory Visit: Payer: Self-pay | Admitting: Medical

## 2023-08-07 ENCOUNTER — Other Ambulatory Visit: Payer: Self-pay | Admitting: Medical

## 2023-08-07 ENCOUNTER — Other Ambulatory Visit (HOSPITAL_COMMUNITY): Payer: Self-pay

## 2023-08-07 ENCOUNTER — Encounter: Payer: Self-pay | Admitting: Neurology

## 2023-08-07 ENCOUNTER — Ambulatory Visit: Admitting: Neurology

## 2023-08-07 VITALS — BP 117/75 | HR 72 | Ht 66.0 in | Wt 227.4 lb

## 2023-08-07 DIAGNOSIS — R202 Paresthesia of skin: Secondary | ICD-10-CM

## 2023-08-07 DIAGNOSIS — M153 Secondary multiple arthritis: Secondary | ICD-10-CM

## 2023-08-07 DIAGNOSIS — R682 Dry mouth, unspecified: Secondary | ICD-10-CM | POA: Diagnosis not present

## 2023-08-07 DIAGNOSIS — M5416 Radiculopathy, lumbar region: Secondary | ICD-10-CM | POA: Diagnosis not present

## 2023-08-07 DIAGNOSIS — H04123 Dry eye syndrome of bilateral lacrimal glands: Secondary | ICD-10-CM

## 2023-08-07 DIAGNOSIS — M5412 Radiculopathy, cervical region: Secondary | ICD-10-CM

## 2023-08-07 DIAGNOSIS — Z9889 Other specified postprocedural states: Secondary | ICD-10-CM | POA: Diagnosis not present

## 2023-08-07 MED ORDER — VALACYCLOVIR HCL 500 MG PO TABS
500.0000 mg | ORAL_TABLET | Freq: Every day | ORAL | 1 refills | Status: DC
  Filled 2023-08-07 – 2023-10-19 (×2): qty 90, 90d supply, fill #0

## 2023-08-07 NOTE — Patient Instructions (Signed)
 We will check blood work today and call you with the test results. We will do an EMG and nerve conduction velocity test, which is an electrical nerve and muscle test, which we will schedule. We will call you with the results. Talk to your primary care about seeing a rheumatologist for your concern about Sjogren's syndrome.  Continue with management of your pain and tingling through neurosurgery as planned/scheduled.

## 2023-08-07 NOTE — Progress Notes (Signed)
 Subjective:    Patient ID: Kristi Avila is a 68 y.o. female.  HPI    Interim history:   Kristi Avila is a 68 year old female with an underlying complex medical history of arthritis with status post bilateral hip replacements, chronic neck and chronic low back pain, overactive bladder, obesity, hyperlipidemia, reflux disease, carpal tunnel syndrome, Barrett's esophagus, squamous cell cancer, and paresthesias, who presents for evaluation of her paresthesias.  The patient is accompanied by her significant other and is referred by her neurosurgery NP, Gerard Beck. I reviewed the office visit note from 06/20/2023.    She reports chronic tingling affecting the left arm and left leg.  She has no complete numbness, no actual weakness, no recent falls, no foot drop, is status post right carpal tunnel surgery in January 2025 and left carpal tunnel surgery in June 2025, had surgery under Dr. Murrell.  She reports having side effects from gabapentin  and is currently on Lyrica.  She does not have much in the way of symptoms on the right side.  About 3 years ago her eye doctor encouraged her to get checked out for Sjogren's syndrome, she has not seen rheumatology for this concern or address it with her PCP.  She has chronic cervical and lumbar radiculopathy and was found to have median neuropathy for which she will have carpal tunnel release.  I had evaluated her for balance problems a few years ago.  She had a brain MRI with and without contrast on 12/14/2020 and I reviewed the results:  IMPRESSION:    Normal MRI brain (with and without).    She has been on gabapentin .  She has also received neck and back injections under Dr. Bonner. She had an EMG and nerve conduction velocity test through neurosurgery and I reviewed the report from 06/06/2023, study was conducted by Dr. Oneil Queen. Clinical impression: Abnormal study evidence of chronic lumbar radiculopathy affecting the left L4 and L5 nerve roots no  active denervation noted.  Evidence of demyelinating and axonal sensorimotor peripheral neuropathy in the left lower limb.  Recommend neurology evaluation for further treatment.  No evidence of peripheral motor entrapment neuropathy in the left lower limb.  She had blood work through her primary care provider on 11/30/2022 and I reviewed results in the electronic chart.  Hemoglobin A1c was 5.6, HIV nonreactive, CMP showed benign findings with normal AST and normal ALT, RPR nonreactive, TSH normal at 2.09, vitamin B12 was mildly elevated at 1275, lipid panel benign with total cholesterol of 149, LDL 67, triglycerides 94.    Previously:   11/30/2020: 68 year old right-handed woman with an underlying medical history of arthritis, bilateral hip replacements under Dr. Hiram, chronic neck and low back pain, status post injections into the lower back and also upper back with steroids under Dr. Bonner, reflux disease, Barrett's esophagus, depression, hyperlipidemia, overactive bladder, and obesity, who reports an approximately 1 year issue with feeling off balance at times.  Symptoms come and go, she denies any vertigo symptoms or lightheadedness or feeling of fainting, she reports this feeling more secure, stumbling even having had no recent falls thankfully.  She has had multiple joint surgeries including bilateral knee replacement surgeries and bilateral hip replacement surgeries.  She endorses chronic neck pain and low back pain.  She has had intermittent headaches for which she takes Tylenol .  She does not have a history of migraines.  She has intermittently noticed tingling in her upper extremities and lower extremities, no sustained numbness.  She has  not had any sudden onset of one-sided weakness or numbness or tingling or droopy face or slurring of speech.  She happens well with water , she limits her caffeine to soda, approximately 1 or two 8 ounce servings per day on average.  She drinks alcohol on the  weekends, typically Friday nights and Saturday nights, up to 3 drinks each day.  She is a non-smoker.  She has had some forgetfulness.  She reports a family history of dementia affecting both paternal grandparents.   I reviewed your office note from 06/15/2020.  She had recent blood work through your office and I was able to review the results: Lipid panel showed elevated total cholesterol at 243, triglycerides 131, HDL 73, LDL 147, A1c was 5.6, TSH 8.06, CMP showed glucose 90, BUN 13, creatinine 0.98, alk phos 107, AST 26, ALT 27.  Vitamin B12 was elevated above 2000.  She had a CT of the abdomen and pelvis with contrast on 12/31/2019 which showed:  IMPRESSION: 1. Cholelithiasis with no CT findings to suggest acute cholecystitis or choledocholithiasis. 2. Uterine fibroid.   Of note, she is on Xanax  as needed, 0.25 mg strength half a pill to 1 pill as needed for anxiety, reports that she takes it maybe 3 times in a month.  She does not currently take any risperidone.  She is on Cymbalta  60 mg strength 1 pill twice daily.  She was on amitriptyline for her bladder hyperactivity but due to mouth dryness she was switched to Detrol .   She had a brain MRI with and without contrast due to left-sided hearing loss on 05/08/2017 and I reviewed the results: IMPRESSION: Negative exam.   She had a brain MRI with and without contrast on 11/30/2004 for indication of dizziness and vertigo and I reviewed the results: Impression: Negative MRI of the brain.  Scattered ethmoid air cell disease with probable previous nasal surgery.    Her Past Medical History Is Significant For: Past Medical History:  Diagnosis Date   Arthritis    Barrett's esophagus with esophagitis 07/23/2012   EGD: CLO test results negative. pathology: Barretss: Repeat EGD 3 years   Blood transfusion without reported diagnosis    remote past   Cancer (HCC)    squamous cell CA- on legs   Depression    Family history of anesthesia complication     mother trouble waking up   Frequency of urination    GERD (gastroesophageal reflux disease)    h/o peptic ulcer   High cholesterol    HSV-2 infection    anal   Nocturia    Obesity    Overactive bladder    Reflux    Urgency of urination    UTI (urinary tract infection)     Her Past Surgical History Is Significant For: Past Surgical History:  Procedure Laterality Date   ARTHROSCOPY KNEE W/ DRILLING     X4 ON RT KNEE   CARPAL TUNNEL RELEASE Right 2000   CARPAL TUNNEL RELEASE Right 03/12/2023   Procedure: RIGHT RECURRENT CARPAL TUNNEL RELEASE;  Surgeon: Murrell Drivers, MD;  Location: [facility];  Service: Orthopedics;  Laterality: Right;  60 MIN   CARPAL TUNNEL RELEASE Left 06/25/2023   Procedure: CARPAL TUNNEL RELEASE;  Surgeon: Murrell Drivers, MD;  Location: [facility];  Service: Orthopedics;  Laterality: Left;   COLONOSCOPY  2017   repeat 2027   colonscopy  2007, 2017   ESOPHAGOGASTRODUODENOSCOPY  2007   Dr. Zulema, [facility]  foot fibroma surgery  all before 1985   x 3   HEMORRHOID SURGERY  04/2006   HIP FRACTURE SURGERY Right 2018   KENALOG  INJECTION Bilateral 03/12/2023   Procedure: KENALOG  INJECTION;  Surgeon: Murrell Drivers, MD;  Location:  SURGERY CENTER;  Service: Orthopedics;  Laterality: Bilateral;   KNEE ARTHROSCOPY     X2 LEFT KNEE   LIPOMA EXCISION  11/2020   SINUS EXPLORATION  1995   TOE AMPUTATION  1959   TOTAL HIP ARTHROPLASTY Left 2022   TOTAL KNEE ARTHROPLASTY Right 09/22/2012   Procedure: RIGHT TOTAL KNEE ARTHROPLASTY;  Surgeon: Dempsey LULLA Moan, MD;  Location: [facility];  Service: Orthopedics;  Laterality: Right;   TOTAL KNEE ARTHROPLASTY Left 09/28/2013   Procedure: LEFT TOTAL KNEE ARTHROPLASTY, CORTISONE INJECTION RIGHT HIP;  Surgeon: Dempsey Moan LULLA, MD;  Location: [facility];  Service: Orthopedics;  Laterality: Left;   UPPER GASTROINTESTINAL ENDOSCOPY      Her Family History Is Significant For: Family History   Problem Relation Age of Onset   Heart disease Mother    Stroke Mother    Arthritis Mother    Pulmonary fibrosis Father        pulmonary fibrosis   Pulmonary disease Father    Cancer Sister        pancreatic   Diabetes Brother    Colon cancer Neg Hx    Esophageal cancer Neg Hx    Stomach cancer Neg Hx    Rectal cancer Neg Hx     Her Social History Is Significant For: Social History   Socioeconomic History   Marital status: Divorced    Spouse name: Not on file   Number of children: 0   Years of education: Not on file   Highest education level: Bachelor's degree (e.g., BA, AB, BS)  Occupational History   Occupation: Engineer, manufacturing systems [employer]  Tobacco Use   Smoking status: Never   Smokeless tobacco: Never  Vaping Use   Vaping status: Never Used  Substance and Sexual Activity   Alcohol use: Yes    Alcohol/week: 2.0 standard drinks of alcohol    Types: 2 Cans of beer per week    Comment: 2 drinks a week   Drug use: No   Sexual activity: Yes    Birth control/protection: Post-menopausal  Other Topics Concern   Not on file  Social History Narrative   Boyfriend lives with her.   Retired.  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  11/2022.     Social Drivers of Health   Financial Resource Strain: Medium Risk (03/31/2023)   Overall Financial Resource Strain (CARDIA)    Difficulty of Paying Living Expenses: Somewhat hard  Food Insecurity: No Food Insecurity (03/31/2023)   Hunger Vital Sign    Worried About Running Out of Food in the Last Year: Never true    Ran Out of Food in the Last Year: Never true  Transportation Needs: No Transportation Needs (03/31/2023)   PRAPARE - Administrator, Civil Service (Medical): No    Lack of Transportation (Non-Medical): No  Physical Activity: Insufficiently Active (03/31/2023)   Exercise Vital Sign    Days of Exercise per Week: 3  days    Minutes of Exercise per Session: 30 min  Stress: Stress Concern Present (03/31/2023)   [employer] of Occupational Health - Occupational Stress Questionnaire    Feeling of Stress :  To some extent  Social Connections: Socially Integrated (03/31/2023)   Social Connection and Isolation Panel    Frequency of Communication with Friends and Family: More than three times a week    Frequency of Social Gatherings with Friends and Family: More than three times a week    Attends Religious Services: More than 4 times per year    Active Member of [employer] or Organizations: Yes    Attends Banker Meetings: 1 to 4 times per year    Marital Status: Living with partner    Her Allergies Are:  Allergies  Allergen Reactions   Cephalosporins Diarrhea   Codeine Itching   Latex Itching   Pravastatin      Myalgias   Simvastatin     myalgias   Sulfa Antibiotics Diarrhea  :   Her Current Medications Are:  Outpatient Encounter Medications as of 08/07/2023  Medication Sig   cycloSPORINE (RESTASIS) 0.05 % ophthalmic emulsion Place 1 drop into both eyes 2 (two) times daily.   dexlansoprazole  (DEXILANT ) 60 MG capsule TAKE 1 CAPSULE(60 MG) BY MOUTH DAILY   DULoxetine  (CYMBALTA ) 60 MG capsule TAKE 1 CAPSULE BY MOUTH TWICE  DAILY   ezetimibe  (ZETIA ) 10 MG tablet Take 1 tablet (10 mg total) by mouth daily.   famotidine  (PEPCID ) 20 MG tablet Take 40 mg by mouth at bedtime.   meloxicam  (MOBIC ) 15 MG tablet Take 1 tablet (15 mg total) by mouth daily.   MULTIPLE VITAMIN PO Take 1 tablet by mouth daily.   Polyethyl Glycol-Propyl Glycol (SYSTANE FREE OP) Apply to eye.   pregabalin (LYRICA) 100 MG capsule Take 100 mg by mouth in the morning, at noon, and at bedtime.   psyllium (METAMUCIL) 58.6 % powder Take by mouth. 2 teaspoons nightly   RESTASIS 0.05 % ophthalmic emulsion Place 1 drop into both eyes 2 (two) times daily.   rosuvastatin  (CRESTOR ) 5 MG tablet TAKE 1 TABLET(5 MG) BY MOUTH EVERY  OTHER DAY   solifenacin  (VESICARE ) 10 MG tablet Take 1 tablet (10 mg total) by mouth daily.   valACYclovir  (VALTREX ) 500 MG tablet Take 1 tablet (500 mg total) by mouth daily.   ZINC-VITAMIN C PO Take 1 tablet by mouth daily in the afternoon.   propranolol  (INDERAL ) 20 MG tablet TAKE 1 TABLET BY MOUTH TWICE A DAY (Patient not taking: Reported on 08/07/2023)   No facility-administered encounter medications on file as of 08/07/2023.  :  Review of Systems:  Out of a complete 14 point review of systems, all are reviewed and negative with the exception of these symptoms as listed below:  Review of Systems  Neurological:        Numbness L side. Seen Dr. Malcolm leash NS.  Numbness only when lying down.  Does not want to be on drugs. Feels lyrica makes her depressed. (Takes only 100mg  at at bedtime).  Questioning sjrogrens. Dry mouth, dry eyes, nose, vagina).       Objective:  Neurological Exam  Physical Exam Physical Examination:   Vitals:   08/07/23 0939  BP: 117/75  Pulse: 72    General Examination: The patient is a very pleasant 68 y.o. female in no acute distress. She appears well-developed and well-nourished and well groomed.   HEENT: Normocephalic, atraumatic, pupils are equal, round and reactive to light, extraocular tracking is good without limitation to gaze excursion or nystagmus noted.  Corrective eyeglasses in place.  Hearing is grossly intact. Face is symmetric with normal facial animation and normal facial sensation  to light touch, temperature and vibration sense. Speech is clear with no dysarthria noted. There is no hypophonia. There is no lip, neck/head, jaw or voice tremor. Neck is supple with full range of passive and active motion. There are no carotid bruits on auscultation. Oropharynx exam reveals: moderate mouth dryness, tongue protrudes centrally and palate elevates symmetrically.    Chest: Clear to auscultation without wheezing, rhonchi or crackles noted.    Heart: S1+S2+0, regular and normal without murmurs, rubs or gallops noted.    Abdomen: Soft, non-tender and non-distended.   Extremities: There is no pitting edema in the distal lower extremities bilaterally.    Skin: Warm and dry without trophic changes noted.    Musculoskeletal: exam reveals arthritic changes in both hands, unremarkable knee replacement scars both knees.    Neurologically:  Mental status: The patient is awake, alert and oriented in all 4 spheres. Her immediate and remote memory, attention, language skills and fund of knowledge are appropriate. There is no evidence of aphasia, agnosia, apraxia or anomia. Speech is clear with normal prosody and enunciation. Thought process is linear. Mood is normal and affect is normal.  Cranial nerves II - XII are as described above under HEENT exam.  Motor exam: Normal bulk, strength and tone is noted. There is no tremor, drift or rebound.  No postural or intention tremor, no resting tremor. Romberg negative.  Reflexes are 1+ in the upper extremities, absent in both knees and both ankles, toes are downgoing bilaterally.  Cerebellar testing: No dysmetria or intention tremor. There is no truncal or gait ataxia.  Sensory exam: intact to light touch, temperature, and vibration sense in the upper and lower extremities.  Gait, station and balance: She stands without difficulty and walks without a limp or steppage.  Preserved arm swing noted, no problems turning are noted. Tandem walk is challenging but doable.                   Assessment and Plan:    In summary, Kristi Avila is a 68 year old female with an underlying complex medical history of arthritis with status post bilateral hip replacements, chronic neck and chronic low back pain, overactive bladder, obesity, hyperlipidemia, reflux disease, carpal tunnel syndrome, Barrett's esophagus, squamous cell cancer, and paresthesias, who presents for evaluation of her paresthesias affecting the left  upper and left lower extremities.  History, presentation, and description of symptoms as well as examination are not telltale for peripheral neuropathy.  I had a long discussion with the patient and her significant other today.  Below is a summary of my recommendations and our discussion points based on chart review, reviewing of test results, physical exam, neurological exam, and history.  She was given these instructions verbally during the visit and also in her MyChart after visit summary.  <<We will check blood work today and call you with the test results. We will do an EMG and nerve conduction velocity test, which is an electrical nerve and muscle test, which we will schedule. We will call you with the results. Talk to your primary care about seeing a rheumatologist for your concern about Sjogren's syndrome.  Continue with management of your pain and tingling through neurosurgery as planned/scheduled.  >>     I answered all her questions today to the patient and her SO were in agreement.  I spent 45 minutes in total face-to-face time and in reviewing records during pre-charting, more than 50% of which was spent in counseling and coordination of  care, reviewing test results, reviewing medications and treatment regimen and/or in discussing or reviewing the diagnosis of paresthesias, the prognosis and treatment options. Pertinent laboratory and imaging test results that were available during this visit with the patient were reviewed by me and considered in my medical decision making (see chart for details).

## 2023-08-10 LAB — ANA W/REFLEX: Anti Nuclear Antibody (ANA): NEGATIVE

## 2023-08-10 LAB — MULTIPLE MYELOMA PANEL, SERUM
Albumin SerPl Elph-Mcnc: 3.9 g/dL (ref 2.9–4.4)
Albumin/Glob SerPl: 1.4 (ref 0.7–1.7)
Alpha 1: 0.2 g/dL (ref 0.0–0.4)
Alpha2 Glob SerPl Elph-Mcnc: 0.6 g/dL (ref 0.4–1.0)
B-Globulin SerPl Elph-Mcnc: 1.1 g/dL (ref 0.7–1.3)
Gamma Glob SerPl Elph-Mcnc: 0.9 g/dL (ref 0.4–1.8)
Globulin, Total: 2.8 g/dL (ref 2.2–3.9)
IgA/Immunoglobulin A, Serum: 114 mg/dL (ref 87–352)
IgG (Immunoglobin G), Serum: 920 mg/dL (ref 586–1602)
IgM (Immunoglobulin M), Srm: 94 mg/dL (ref 26–217)
Total Protein: 6.7 g/dL (ref 6.0–8.5)

## 2023-08-10 LAB — HEAVY METALS PROFILE II, BLOOD
Arsenic: 1 ug/L (ref 0–9)
Cadmium: <0.5 ug/L (ref 0.0–1.2)
Lead, Blood: <1 ug/dL (ref 0.0–3.4)
Mercury: <1 ug/L (ref 0.0–14.9)

## 2023-08-12 ENCOUNTER — Ambulatory Visit: Payer: Self-pay | Admitting: Neurology

## 2023-08-14 DIAGNOSIS — R29898 Other symptoms and signs involving the musculoskeletal system: Secondary | ICD-10-CM | POA: Diagnosis not present

## 2023-08-14 DIAGNOSIS — M79641 Pain in right hand: Secondary | ICD-10-CM | POA: Diagnosis not present

## 2023-08-14 DIAGNOSIS — M79642 Pain in left hand: Secondary | ICD-10-CM | POA: Diagnosis not present

## 2023-09-03 ENCOUNTER — Other Ambulatory Visit: Payer: Self-pay

## 2023-09-03 NOTE — Progress Notes (Signed)
 This patient has appeared in a report as failing or at risk of failing a medication adherence for cholesterol measure in 2025 fiscal year.   PDC on report: 0.48  The 10-year ASCVD risk score (Arnett DK, et al., 2019) is: 4.3%   Values used to calculate the score:     Age: 68 years     Clincally relevant sex: Female     Is Non-Hispanic African American: No     Diabetic: No     Tobacco smoker: No     Systolic Blood Pressure: 117 mmHg     Is BP treated: No     HDL Cholesterol: 65 mg/dL     Total Cholesterol: 149 mg/dL   Barriers to adherence identified:  Does the patient have cost concerns?: No Does the patient have transportation concerns?: No Does the patient need assistance obtaining refills?: No Does the patient occasionally forget to take some of their prescribed medications?:No Does the patient feel like one/some of their medications make them feel poorly?: No Does the patient have questions or concerns about their medications?: No   Clinical intervention indicated? Yes  []  Pharmacotherapy dose escalation  [] Pharmacotherapy dose decrease [] Initiation of new pharmacotherapy  [] Pharmacotherapy discontinued  [x] Other: Discuss importance of therapy with patient.  Comments: Patient reported taking rosuvastatin  every other day. Last fill date 02/11/2023. Attempted to discuss medication with patient but was disconnected. Patient stated they have plenty on hand.    CPP or PCP notified? Yes  Clinic-embedded pharmacist: [name] Lindau  PCP: Alm RAMAN. Tysinger

## 2023-09-05 NOTE — Progress Notes (Signed)
 Reviewed rosuvastatin  dispensing. Patient received 100 tablets on 12/25/22 from [facility]. Received 45 tablets on 02/11/23. Overall, has received a 290 day supply of medication since 12/25/22. Should be out around 10/11/23, will follow up with patient week prior.

## 2023-09-18 ENCOUNTER — Other Ambulatory Visit: Payer: Self-pay | Admitting: Medical

## 2023-09-19 ENCOUNTER — Other Ambulatory Visit (HOSPITAL_COMMUNITY): Payer: Self-pay

## 2023-09-19 MED ORDER — MELOXICAM 15 MG PO TABS
15.0000 mg | ORAL_TABLET | Freq: Every day | ORAL | 0 refills | Status: DC
  Filled 2023-09-19: qty 90, 90d supply, fill #0

## 2023-09-19 MED ORDER — SOLIFENACIN SUCCINATE 10 MG PO TABS
10.0000 mg | ORAL_TABLET | Freq: Every day | ORAL | 0 refills | Status: DC
  Filled 2023-09-19: qty 90, 90d supply, fill #0

## 2023-09-19 MED ORDER — ROSUVASTATIN CALCIUM 5 MG PO TABS
ORAL_TABLET | ORAL | 0 refills | Status: DC
  Filled 2023-09-19: qty 45, 90d supply, fill #0
  Filled 2023-12-14: qty 45, 90d supply, fill #1

## 2023-09-19 MED ORDER — EZETIMIBE 10 MG PO TABS
10.0000 mg | ORAL_TABLET | Freq: Every day | ORAL | 0 refills | Status: DC
  Filled 2023-09-19: qty 100, 100d supply, fill #0

## 2023-09-25 DIAGNOSIS — C44729 Squamous cell carcinoma of skin of left lower limb, including hip: Secondary | ICD-10-CM | POA: Diagnosis not present

## 2023-09-25 DIAGNOSIS — L820 Inflamed seborrheic keratosis: Secondary | ICD-10-CM | POA: Diagnosis not present

## 2023-09-25 DIAGNOSIS — D485 Neoplasm of uncertain behavior of skin: Secondary | ICD-10-CM | POA: Diagnosis not present

## 2023-09-25 DIAGNOSIS — L821 Other seborrheic keratosis: Secondary | ICD-10-CM | POA: Diagnosis not present

## 2023-09-25 DIAGNOSIS — L570 Actinic keratosis: Secondary | ICD-10-CM | POA: Diagnosis not present

## 2023-09-25 DIAGNOSIS — Z85828 Personal history of other malignant neoplasm of skin: Secondary | ICD-10-CM | POA: Diagnosis not present

## 2023-09-26 DIAGNOSIS — M4316 Spondylolisthesis, lumbar region: Secondary | ICD-10-CM | POA: Diagnosis not present

## 2023-10-01 ENCOUNTER — Other Ambulatory Visit: Payer: Self-pay

## 2023-10-01 ENCOUNTER — Other Ambulatory Visit (HOSPITAL_BASED_OUTPATIENT_CLINIC_OR_DEPARTMENT_OTHER): Payer: Self-pay

## 2023-10-02 ENCOUNTER — Encounter: Admitting: Neurology

## 2023-10-03 NOTE — Progress Notes (Signed)
   10/03/2023  Patient ID: Kristi Avila, female   DOB: 1955-10-30, 68 y.o.   MRN: [id number]  Pharmacy Quality Measure Review  This patient is appearing on a report for being at risk of failing the adherence measure for cholesterol (statin) medications this calendar year.   Medication: Rosuvastatin  Last fill date: 09/19/23 for 90 day supply  Insurance report was not up to date. No action needed at this time.   Kristi Avila, PharmD Clinical Pharmacist [phone]

## 2023-10-09 ENCOUNTER — Encounter: Admitting: Obstetrics and Gynecology

## 2023-10-17 ENCOUNTER — Encounter: Payer: Self-pay | Admitting: Cardiology

## 2023-10-17 NOTE — Progress Notes (Unsigned)
 Cardiology Office Note   Date:  10/18/2023   ID:  Kristi Avila, Kristi Avila 17-Mar-1955, MRN [id number]  PCP:  Bulah Alm RAMAN, PA-C  Cardiologist:   None Referring:  Bulah Alm RAMAN, PA-C  Chief Complaint  Patient presents with   Palpitations      History of Present Illness: Kristi Avila is a 68 y.o. female who presents for evaluation of palpitations.  She has no prior cardiac history.  She has no prior cardiac history.  She works for [employer] which is a family business.  She has a lot of stress at running around and doing things for other people.  She has noticed that she has had palpitations.  She says these last for about 5 seconds at a time.  She might get them daily or a couple of times a day.  They might not happen for a few days.  She feels her heart going fast.  She does not have presyncope or syncope though she feels a little bit lightheaded after the events.  She cannot bring them on.  She is physically active and she does not get chest discomfort, neck or arm discomfort.  She does not have shortness of breath, PND or orthopnea.  She said the only time she really gets chest discomfort if she is talking to her brother or mom.  She gets some slight midsternal chest tightness but not when she is climbing stairs or doing other physical activity.  She did wear a monitor that demonstrated infrequent runs of SVT  Past Medical History:  Diagnosis Date   Arthritis    Barrett's esophagus with esophagitis 07/23/2012   EGD: CLO test results negative. pathology: [facility]: Repeat EGD 3 years   Blood transfusion without reported diagnosis    remote past   Cancer (HCC)    squamous cell CA- on legs   Depression    GERD (gastroesophageal reflux disease)    h/o peptic ulcer   High cholesterol    HSV-2 infection    anal   Obesity    Overactive bladder    Reflux     Past Surgical History:  Procedure Laterality Date   ARTHROSCOPY KNEE W/ DRILLING     X4 ON RT KNEE   CARPAL TUNNEL  RELEASE Right 2000   CARPAL TUNNEL RELEASE Right 03/12/2023   Procedure: RIGHT RECURRENT CARPAL TUNNEL RELEASE;  Surgeon: Murrell Drivers, MD;  Location: [facility];  Service: Orthopedics;  Laterality: Right;  60 MIN   CARPAL TUNNEL RELEASE Left 06/25/2023   Procedure: CARPAL TUNNEL RELEASE;  Surgeon: Murrell Drivers, MD;  [facility];  Service: Orthopedics;  Laterality: Left;   COLONOSCOPY  2017   repeat 2027   colonscopy  2007, 2017   ESOPHAGOGASTRODUODENOSCOPY  2007   Dr. Zulema, [facility]   foot fibroma surgery  all before 1985   x 3   HEMORRHOID SURGERY  04/2006   HIP FRACTURE SURGERY Right 2018   KENALOG  INJECTION Bilateral 03/12/2023   Procedure: KENALOG  INJECTION;  Surgeon: Murrell Drivers, MD;  Location:  SURGERY CENTER;  Service: Orthopedics;  Laterality: Bilateral;   KNEE ARTHROSCOPY     X2 LEFT KNEE   LIPOMA EXCISION  11/2020   SINUS EXPLORATION  1995   TOE AMPUTATION  1959   TOTAL HIP ARTHROPLASTY Left 2022   TOTAL KNEE ARTHROPLASTY Right 09/22/2012   Procedure: RIGHT TOTAL KNEE ARTHROPLASTY;  Surgeon: Dempsey LULLA Moan, MD;  Location: [facility];  Service: Orthopedics;  Laterality: Right;   TOTAL KNEE ARTHROPLASTY Left 09/28/2013   Procedure: LEFT TOTAL KNEE ARTHROPLASTY, CORTISONE INJECTION RIGHT HIP;  Surgeon: Dempsey Melodi GAILS, MD;  Location: [facility];  Service: Orthopedics;  Laterality: Left;   UPPER GASTROINTESTINAL ENDOSCOPY       Current Outpatient Medications  Medication Sig Dispense Refill   dexlansoprazole  (DEXILANT ) 60 MG capsule TAKE 1 CAPSULE(60 MG) BY MOUTH DAILY 90 capsule 3   DULoxetine  (CYMBALTA ) 60 MG capsule TAKE 1 CAPSULE BY MOUTH TWICE  DAILY 180 capsule 3   ezetimibe  (ZETIA ) 10 MG tablet Take 1 tablet (10 mg total) by mouth daily. 100 tablet 0   famotidine  (PEPCID ) 20 MG tablet Take 40 mg by mouth at bedtime.     meloxicam  (MOBIC ) 15 MG tablet Take 1 tablet (15 mg total) by mouth daily. 90 tablet 0   MULTIPLE  VITAMIN PO Take 1 tablet by mouth daily.     Polyethyl Glycol-Propyl Glycol (SYSTANE FREE OP) Apply to eye.     propranolol  (INDERAL ) 10 MG tablet Take 1 tablet (10 mg total) by mouth 3 (three) times daily. 270 tablet 3   psyllium (METAMUCIL) 58.6 % powder Take by mouth. 2 teaspoons nightly     RESTASIS 0.05 % ophthalmic emulsion Place 1 drop into both eyes 2 (two) times daily.     rosuvastatin  (CRESTOR ) 5 MG tablet TAKE 1 TABLET(5 MG) BY MOUTH EVERY OTHER DAY 90 tablet 0   solifenacin  (VESICARE ) 10 MG tablet Take 1 tablet (10 mg total) by mouth daily. 90 tablet 0   valACYclovir  (VALTREX ) 500 MG tablet Take 1 tablet (500 mg total) by mouth daily. 90 tablet 1   ZINC-VITAMIN C PO Take 1 tablet by mouth daily in the afternoon.     cycloSPORINE (RESTASIS) 0.05 % ophthalmic emulsion Place 1 drop into both eyes 2 (two) times daily.     pregabalin (LYRICA) 100 MG capsule Take 100 mg by mouth in the morning, at noon, and at bedtime.     No current facility-administered medications for this visit.    Allergies:   Cephalosporins, Codeine, Latex, Pravastatin , Simvastatin, and Sulfa antibiotics    Social History:  The patient  reports that she has never smoked. She has never used smokeless tobacco. She reports current alcohol use of about 2.0 standard drinks of alcohol per week. She reports that she does not use drugs.   Family History:  The patient's family history includes Arthritis in her mother; Cancer in her sister; Diabetes in her brother; Heart disease in her mother; Pulmonary disease in her father; Pulmonary fibrosis in her father; Stroke in her mother.    ROS:  Please see the history of present illness.   Otherwise, review of systems are positive for none.   All other systems are reviewed and negative.    PHYSICAL EXAM: VS:  BP 110/80   Pulse 83   Ht 5' 6 (1.676 m)   Wt 229 lb (103.9 kg)   LMP 12/31/2019   SpO2 95%   BMI 36.96 kg/m  , BMI Body mass index is 36.96 kg/m. GENERAL:   Well appearing HEENT:  Pupils equal round and reactive, fundi not visualized, oral mucosa unremarkable NECK:  No jugular venous distention, waveform within normal limits, carotid upstroke brisk and symmetric, no bruits, no thyromegaly LYMPHATICS:  No cervical, inguinal adenopathy LUNGS:  Clear to auscultation bilaterally BACK:  No CVA tenderness CHEST:  Unremarkable HEART:  PMI not displaced or sustained,S1 and S2 within normal  limits, no S3, no S4, no clicks, no rubs, no murmurs ABD:  Flat, positive bowel sounds normal in frequency in pitch, no bruits, no rebound, no guarding, no midline pulsatile mass, no hepatomegaly, no splenomegaly EXT:  2 plus pulses throughout, no edema, no cyanosis no clubbing SKIN:  No rashes no nodules NEURO:  Cranial nerves II through XII grossly intact, motor grossly intact throughout [facility]:  Cognitively intact, oriented to person place and time    EKG:  EKG Interpretation Date/Time:  Friday October 18 2023 08:18:54 EDT Ventricular Rate:  83 PR Interval:  166 QRS Duration:  90 QT Interval:  380 QTC Calculation: 446 R Axis:   21  Text Interpretation: Normal sinus rhythm Poor anterior R wave progression When compared with ECG of 24-May-2012 15:44, Confirmed by Lavona Agent ([id number]) on 10/18/2023 8:33:19 AM     Recent Labs: 11/30/2022: ALT 24; BUN 11; Creatinine, Ser 0.99; Potassium 4.5; Sodium 142; TSH 2.090    Lipid Panel    Component Value Date/Time   CHOL 149 11/30/2022 0812   TRIG 94 11/30/2022 0812   HDL 65 11/30/2022 0812   CHOLHDL 2.3 11/30/2022 0812   CHOLHDL 3.0 10/07/2019 0711   VLDL 18 12/06/2015 0800   LDLCALC 67 11/30/2022 0812   LDLCALC 120 (H) 10/07/2019 0711      Wt Readings from Last 3 Encounters:  10/18/23 229 lb (103.9 kg)  08/07/23 227 lb 6.4 oz (103.1 kg)  07/08/23 228 lb 6.4 oz (103.6 kg)      Other studies Reviewed: Additional studies/ records that were reviewed today include: Monitor as above. . Review of the  above records demonstrates:  Please see elsewhere in the note.     ASSESSMENT AND PLAN:   Palpitations:   The patient has SVT fairly infrequent runs short-lived which I think is what she is feeling.  She is up-to-date with blood work to include CBC, BMP and TSH.  She was given a prescription for propranolol  previously but she did not have it filled.  I have suggested that if it is reasonable to start this twice daily.  I would take it in the morning and 8 hours later.  If she finds that she needs more we can go to 3 times daily.  If she has any problems I like to suggest once in the morning.  Alternatively we talked about pill in pocket.  At this point her exam is unremarkable.  I am not suggesting further imaging but we will manage this symptomatically.  She can come back to see me as needed and let me know about how her symptoms resolved.  Current medicines are reviewed at length with the patient today.  The patient does not have concerns regarding medicines.  The following changes have been made:  no change  Labs/ tests ordered today include:   Orders Placed This Encounter  Procedures   EKG 12-Lead     Disposition:   FU with with me as needed.     Signed, Agent Lavona, MD  10/18/2023 9:00 AM    [facility]

## 2023-10-18 ENCOUNTER — Other Ambulatory Visit: Payer: Self-pay

## 2023-10-18 ENCOUNTER — Encounter: Payer: Self-pay | Admitting: Cardiology

## 2023-10-18 ENCOUNTER — Ambulatory Visit: Attending: Cardiology | Admitting: Cardiology

## 2023-10-18 ENCOUNTER — Other Ambulatory Visit (HOSPITAL_COMMUNITY): Payer: Self-pay

## 2023-10-18 VITALS — BP 110/80 | HR 83 | Ht 66.0 in | Wt 229.0 lb

## 2023-10-18 DIAGNOSIS — R0609 Other forms of dyspnea: Secondary | ICD-10-CM | POA: Diagnosis not present

## 2023-10-18 MED ORDER — PROPRANOLOL HCL 10 MG PO TABS
10.0000 mg | ORAL_TABLET | Freq: Three times a day (TID) | ORAL | 3 refills | Status: AC
  Filled 2023-10-18: qty 270, 90d supply, fill #0
  Filled 2024-01-02: qty 270, 90d supply, fill #1

## 2023-10-18 NOTE — Patient Instructions (Signed)
 Medication Instructions:  Start Propranolol  10 mg three times daily *If you need a refill on your cardiac medications before your next appointment, please call your pharmacy*  Lab Work: NONE If you have labs (blood work) drawn today and your tests are completely normal, you will receive your results only by: MyChart Message (if you have MyChart) OR A paper copy in the mail If you have any lab test that is abnormal or we need to change your treatment, we will call you to review the results.  Testing/Procedures: NONE  Follow-Up: At [facility], you and your health needs are our priority.  As part of our continuing mission to provide you with exceptional heart care, our providers are all part of one team.  This team includes your primary Cardiologist (physician) and Advanced Practice Providers or APPs (Physician Assistants and Nurse Practitioners) who all work together to provide you with the care you need, when you need it.  Your next appointment:   As needed  Provider:   Lavona, MD  We recommend signing up for the patient portal called MyChart.  Sign up information is provided on this After Visit Summary.  MyChart is used to connect with patients for Virtual Visits (Telemedicine).  Patients are able to view lab/test results, encounter notes, upcoming appointments, etc.  Non-urgent messages can be sent to your provider as well.   To learn more about what you can do with MyChart, go to [email or web address].

## 2023-10-19 MED FILL — Duloxetine HCl Enteric Coated Pellets Cap 60 MG (Base Eq): ORAL | 90 days supply | Qty: 180 | Fill #1 | Status: AC

## 2023-10-20 ENCOUNTER — Other Ambulatory Visit (HOSPITAL_COMMUNITY): Payer: Self-pay

## 2023-10-21 ENCOUNTER — Other Ambulatory Visit: Payer: Self-pay | Admitting: Medical

## 2023-10-21 ENCOUNTER — Other Ambulatory Visit: Payer: Self-pay

## 2023-11-07 DIAGNOSIS — M25512 Pain in left shoulder: Secondary | ICD-10-CM | POA: Diagnosis not present

## 2023-11-07 DIAGNOSIS — M25511 Pain in right shoulder: Secondary | ICD-10-CM | POA: Diagnosis not present

## 2023-11-11 ENCOUNTER — Other Ambulatory Visit (HOSPITAL_BASED_OUTPATIENT_CLINIC_OR_DEPARTMENT_OTHER): Payer: Self-pay

## 2023-11-14 ENCOUNTER — Other Ambulatory Visit: Payer: Self-pay

## 2023-11-18 ENCOUNTER — Other Ambulatory Visit: Payer: Self-pay | Admitting: Medical

## 2023-11-18 MED ORDER — DEXILANT 60 MG PO CPDR: 1.0000 | DELAYED_RELEASE_CAPSULE | Freq: Every day | ORAL | 0 refills | Status: DC

## 2023-11-18 MED ORDER — TRULANCE 3 MG PO TABS: 1.0000 | ORAL_TABLET | Freq: Every day | ORAL | 0 refills | Status: DC

## 2023-11-18 NOTE — Telephone Encounter (Signed)
 PA needed

## 2023-11-21 ENCOUNTER — Other Ambulatory Visit (HOSPITAL_COMMUNITY): Payer: Self-pay

## 2023-11-21 ENCOUNTER — Telehealth: Payer: Self-pay

## 2023-11-21 NOTE — Telephone Encounter (Signed)
 Request:  TRULANCE  3 MG TABLET     Determination:   Pt needed Rx called in/on file at pharmacy. Pt states she currently gets RX-trulance  via [employer] and so No P/A is needed.

## 2023-11-21 NOTE — Telephone Encounter (Signed)
  This was already sent in on 11/18/23

## 2023-11-25 ENCOUNTER — Other Ambulatory Visit (HOSPITAL_COMMUNITY): Payer: Self-pay

## 2023-12-14 ENCOUNTER — Other Ambulatory Visit (HOSPITAL_COMMUNITY): Payer: Self-pay

## 2023-12-14 ENCOUNTER — Other Ambulatory Visit: Payer: Self-pay | Admitting: Medical

## 2023-12-17 ENCOUNTER — Other Ambulatory Visit (HOSPITAL_COMMUNITY): Payer: Self-pay

## 2023-12-17 ENCOUNTER — Other Ambulatory Visit: Payer: Self-pay

## 2023-12-17 MED ORDER — MELOXICAM 15 MG PO TABS
15.0000 mg | ORAL_TABLET | Freq: Every day | ORAL | 0 refills | Status: AC
  Filled 2023-12-17: qty 90, 90d supply, fill #0

## 2023-12-17 MED ORDER — SOLIFENACIN SUCCINATE 10 MG PO TABS
10.0000 mg | ORAL_TABLET | Freq: Every day | ORAL | 0 refills | Status: AC
  Filled 2023-12-17: qty 90, 90d supply, fill #0

## 2023-12-18 ENCOUNTER — Other Ambulatory Visit: Payer: Self-pay | Admitting: Medical

## 2023-12-19 ENCOUNTER — Telehealth: Payer: Self-pay | Admitting: Internal Medicine

## 2023-12-19 ENCOUNTER — Other Ambulatory Visit (HOSPITAL_COMMUNITY): Payer: Self-pay

## 2023-12-19 NOTE — Telephone Encounter (Signed)
 Copied from CRM #[id number]. Topic: Clinical - Prescription Issue >> Dec 19, 2023  1:23 PM Teressa P wrote: Reason for CRM: Pt called asking about Karla paperwork for getting Dexilant .  She said she dropped it off the same time as the [place].  The [place] was received by the company but the other has not.  Please call patient and let her know if it is in the office.  [phone]

## 2023-12-20 ENCOUNTER — Other Ambulatory Visit: Payer: Self-pay

## 2023-12-20 ENCOUNTER — Other Ambulatory Visit (HOSPITAL_COMMUNITY): Payer: Self-pay

## 2023-12-24 ENCOUNTER — Other Ambulatory Visit: Payer: Self-pay | Admitting: Medical

## 2023-12-24 ENCOUNTER — Other Ambulatory Visit: Payer: Self-pay

## 2023-12-24 ENCOUNTER — Other Ambulatory Visit (HOSPITAL_COMMUNITY): Payer: Self-pay

## 2023-12-24 MED ORDER — EZETIMIBE 10 MG PO TABS
10.0000 mg | ORAL_TABLET | Freq: Every day | ORAL | 0 refills | Status: DC
  Filled 2023-12-24: qty 30, 30d supply, fill #0

## 2023-12-26 DIAGNOSIS — L905 Scar conditions and fibrosis of skin: Secondary | ICD-10-CM | POA: Diagnosis not present

## 2023-12-26 DIAGNOSIS — L738 Other specified follicular disorders: Secondary | ICD-10-CM | POA: Diagnosis not present

## 2023-12-26 DIAGNOSIS — L814 Other melanin hyperpigmentation: Secondary | ICD-10-CM | POA: Diagnosis not present

## 2023-12-26 DIAGNOSIS — Z85828 Personal history of other malignant neoplasm of skin: Secondary | ICD-10-CM | POA: Diagnosis not present

## 2023-12-26 DIAGNOSIS — L570 Actinic keratosis: Secondary | ICD-10-CM | POA: Diagnosis not present

## 2023-12-26 DIAGNOSIS — L821 Other seborrheic keratosis: Secondary | ICD-10-CM | POA: Diagnosis not present

## 2023-12-26 DIAGNOSIS — L820 Inflamed seborrheic keratosis: Secondary | ICD-10-CM | POA: Diagnosis not present

## 2023-12-27 ENCOUNTER — Telehealth: Payer: Self-pay | Admitting: Internal Medicine

## 2023-12-27 ENCOUNTER — Other Ambulatory Visit: Payer: Self-pay | Admitting: Medical

## 2023-12-27 DIAGNOSIS — Z131 Encounter for screening for diabetes mellitus: Secondary | ICD-10-CM

## 2023-12-27 DIAGNOSIS — E785 Hyperlipidemia, unspecified: Secondary | ICD-10-CM

## 2023-12-27 DIAGNOSIS — Z0000 Encounter for general adult medical examination without abnormal findings: Secondary | ICD-10-CM

## 2023-12-27 NOTE — Telephone Encounter (Signed)
 Copied from CRM #[id number]. Topic: Clinical - Request for Lab/Test Order >> Dec 26, 2023  3:23 PM [name] wrote: Reason for CRM: The patient scheduled fasting labs for the end of February and physical in March as that is the soonest her provider had available. She is wanting to make sure orders are in the system for when she comes in to get the labs done. Please assist patient further.

## 2023-12-27 NOTE — Telephone Encounter (Signed)
 Refaxed B[employer]ch PAP & called & informed pt

## 2023-12-30 MED ORDER — DEXILANT 60 MG PO CPDR: 1.0000 | DELAYED_RELEASE_CAPSULE | Freq: Every day | ORAL | 0 refills | Status: AC

## 2023-12-30 MED ORDER — TRULANCE 3 MG PO TABS: 1.0000 | ORAL_TABLET | Freq: Every day | ORAL | 0 refills | Status: AC

## 2023-12-30 NOTE — Telephone Encounter (Signed)
 sent

## 2023-12-30 NOTE — Addendum Note (Signed)
 Addended by: VICCI HUSBAND A on: 12/30/2023 02:43 PM   Modules accepted: Orders

## 2023-12-30 NOTE — Telephone Encounter (Signed)
 Both PAP's had been faxed but pt states Bausch wasn't received Refaxed Bausch PAP & called & informed pt  Pt needs refills Trulance  & Dexilant  for 90 days each so she can get 1 more refill on current PAP approval.  [name] can you send those in

## 2023-12-30 NOTE — Telephone Encounter (Signed)
 Patient needs a new e-rx sent for Dexilant  60mg  90ds sent to her patient assistance pharmacy.  [facility] [address]  [place] [address]  Fax: [phone]  Looks like a previous refill last month was sent to a local [facility] instead of this pharmacy. Can we please resend?

## 2023-12-30 NOTE — Telephone Encounter (Signed)
 Leita, I had already faxed this in last month when she brought it in. Received confirmation so unsure why they have not received. It is in her folder if you are able to fax in prior to me being back in office on Thursday.

## 2023-12-30 NOTE — Telephone Encounter (Signed)
 Leita or [name], please check on this

## 2023-12-31 ENCOUNTER — Ambulatory Visit: Admitting: Medical

## 2023-12-31 ENCOUNTER — Encounter: Payer: Self-pay | Admitting: Medical

## 2023-12-31 VITALS — BP 118/80 | HR 78 | Ht 65.5 in | Wt 229.4 lb

## 2023-12-31 DIAGNOSIS — F32A Depression, unspecified: Secondary | ICD-10-CM | POA: Diagnosis not present

## 2023-12-31 DIAGNOSIS — E785 Hyperlipidemia, unspecified: Secondary | ICD-10-CM | POA: Diagnosis not present

## 2023-12-31 DIAGNOSIS — F419 Anxiety disorder, unspecified: Secondary | ICD-10-CM | POA: Diagnosis not present

## 2023-12-31 DIAGNOSIS — Z136 Encounter for screening for cardiovascular disorders: Secondary | ICD-10-CM

## 2023-12-31 DIAGNOSIS — Z79899 Other long term (current) drug therapy: Secondary | ICD-10-CM | POA: Diagnosis not present

## 2023-12-31 DIAGNOSIS — R195 Other fecal abnormalities: Secondary | ICD-10-CM

## 2023-12-31 DIAGNOSIS — Z0000 Encounter for general adult medical examination without abnormal findings: Secondary | ICD-10-CM

## 2023-12-31 DIAGNOSIS — K219 Gastro-esophageal reflux disease without esophagitis: Secondary | ICD-10-CM

## 2023-12-31 DIAGNOSIS — Z7185 Encounter for immunization safety counseling: Secondary | ICD-10-CM | POA: Diagnosis not present

## 2023-12-31 DIAGNOSIS — Z1159 Encounter for screening for other viral diseases: Secondary | ICD-10-CM

## 2023-12-31 DIAGNOSIS — Z1389 Encounter for screening for other disorder: Secondary | ICD-10-CM

## 2023-12-31 DIAGNOSIS — K5909 Other constipation: Secondary | ICD-10-CM

## 2023-12-31 NOTE — Progress Notes (Signed)
 Subjective:    Kristi Avila is a 68 y.o. female who presents for Preventative Services visit and chronic medical problems/med check visit.   Chief Complaint  Patient presents with   Annual Exam    Nonfasting cpe, would like to be checked for C-diff due to having diarrhea, would like a referral to psych and therapist.     Primary Care Provider Curtina Grills, Alm RAMAN, PA-C here for primary care  Current Health Care Team: Dentist, Dr. Salina GI- Dr. Melodee GYN- Dr. Gorge Derm-Dr. Rolan Molt Ortho-Dr. Murrell, Dr. Hiram Dr. Toribio Cedar, GI Neurosurg-Dr. [facility] doctor, Dr. Georgann Eye  Kristi Avila is a 68 year old female who presents with gastrointestinal symptoms.  She experiences gastrointestinal symptoms characterized by alternating between watery stools and constipation. These symptoms began approximately three years ago after a cruise to [place], during which she consumed a significant amount of lettuce. She has not fully recovered since then. Without her medication, Trulance , she becomes constipated. She requested a C. diff test due to her ongoing gastrointestinal symptoms given increased risk on dexilant .  She is currently taking Trulance  to manage her bowel movements. Additionally, she is on propranolol , which was adjusted by her cardiologist in October, and Cymbalta  60 mg twice daily for mood management. She is considering a referral to a psychiatrist to discuss her antidepressant regimen and address stress related to family issues, including caring for her disabled mother and dealing with a challenging brother.  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 She does not have a structured exercise program but is constantly on the move. She has been in a stable relationship for five years.   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  She reports compliance with rosuvastatin  Crestor  5 mg every other day.  She also takes Zetia  10 mg daily  She continues on Vesicare   She continues on Valtrex  for prevention    Exercise Current exercise habits:  On the go regularly, takes care of rental houses, helping take care of her mother   Depression Screen    12/31/2023   11:12 AM  Depression screen PHQ 2/9  Decreased Interest 0  Down, Depressed, Hopeless 1  PHQ - 2 Score 1  Altered sleeping 0  Tired, decreased energy 1  Change in appetite 3  Feeling bad or failure about yourself  3  Trouble concentrating 3  Moving slowly or fidgety/restless 0  Suicidal thoughts 0  PHQ-9 Score 11  Difficult doing work/chores Somewhat difficult    Fall Risk Screen    12/31/2023   11:12 AM 11/27/2022    1:29 PM 06/04/2022    3:40 PM 11/23/2021   12:10 PM 11/15/2020    8:31 AM  Fall Risk   Falls in the past year? 0 0 0 0 0  Number falls in past yr: 0 0 0 0 0  Injury with Fall? 0 0  0  0  0   Risk for fall due to : No Fall Risks No Fall Risks No Fall Risks No Fall Risks No Fall Risks  Follow up Falls evaluation completed Falls evaluation completed Falls evaluation completed Falls evaluation completed  Falls evaluation completed      Data saved with a previous flowsheet row definition    Gait Assessment: Normal gait observed yes  Advanced directives  Does patient have a Health Care Power of Attorney? No Does patient have a Living Will? No  Past Medical History:  Diagnosis Date   Arthritis    Barrett's esophagus with esophagitis 07/23/2012   EGD: CLO test results negative. pathology: Barretss: Repeat EGD 3 years   Blood transfusion without reported diagnosis    remote past   Cancer (HCC)    squamous cell CA- on legs   Depression    GERD (gastroesophageal reflux disease)    h/o peptic ulcer   High cholesterol    HSV-2 infection    anal   Obesity    Overactive bladder    Reflux      Past Surgical History:  Procedure Laterality Date   ARTHROSCOPY KNEE W/ DRILLING     X4 ON RT KNEE   CARPAL TUNNEL RELEASE Right 2000   CARPAL TUNNEL RELEASE Right 03/12/2023   Procedure: RIGHT RECURRENT CARPAL TUNNEL RELEASE;  Surgeon: Murrell Drivers, MD;  Location: [facility];  Service: Orthopedics;  Laterality: Right;  60 MIN   CARPAL TUNNEL RELEASE Left 06/25/2023   Procedure: CARPAL TUNNEL RELEASE;  Surgeon: Murrell Drivers, MD;  Location: [facility];  Service: Orthopedics;  Laterality: Left;   COLONOSCOPY  2017   repeat 2027   colonscopy  2007, 2017   ESOPHAGOGASTRODUODENOSCOPY  2007   Dr. Zulema, [facility]   foot fibroma surgery  all before 1985   x 3   HEMORRHOID SURGERY  04/2006   HIP FRACTURE SURGERY Right 2018   KENALOG  INJECTION Bilateral 03/12/2023   Procedure: KENALOG  INJECTION;  Surgeon: Murrell Drivers, MD;  Location: [facility];  Service: Orthopedics;  Laterality: Bilateral;   KNEE ARTHROSCOPY     X2 LEFT KNEE   LIPOMA EXCISION  11/2020   SINUS EXPLORATION  1995   TOE AMPUTATION  1959   TOTAL HIP ARTHROPLASTY Left 2022   TOTAL KNEE ARTHROPLASTY Right 09/22/2012   Procedure: RIGHT TOTAL KNEE ARTHROPLASTY;  Surgeon: Dempsey LULLA Moan, MD;  Location: [facility];  Service: Orthopedics;  Laterality: Right;   TOTAL KNEE ARTHROPLASTY Left 09/28/2013   Procedure: LEFT TOTAL KNEE ARTHROPLASTY, CORTISONE INJECTION RIGHT HIP;  Surgeon: Dempsey Moan LULLA, MD;  Location: [facility];  Service: Orthopedics;  Laterality: Left;   UPPER GASTROINTESTINAL ENDOSCOPY      Social History   Socioeconomic History   Marital status: Divorced    Spouse name: Not on file   Number of children: 0   Years of education: Not on file   Highest education level: Bachelor's degree (e.g., BA, AB, BS)  Occupational History   Occupation: Engineer, [employer]  Tobacco Use   Smoking status: Never   Smokeless tobacco: Never  Vaping Use   Vaping  status: Never Used  Substance and Sexual Activity   Alcohol use: Yes    Alcohol/week: 2.0 standard drinks of alcohol    Types: 2 Cans of beer per week    Comment: 2 drinks a week   Drug use: No   Sexual activity: Yes    Birth control/protection: Post-menopausal  Other Topics Concern   Not on file  Social History Narrative   Boyfriend lives with her.   Retired.  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  11/2022.     Social Drivers of Health   Tobacco Use: Low Risk (10/18/2023)   Patient History  Smoking Tobacco Use: Never    Smokeless Tobacco Use: Never    Passive Exposure: Not on file  Financial Resource Strain: High Risk (12/30/2023)   Overall Financial Resource Strain (CARDIA)    Difficulty of Paying Living Expenses: Hard  Food Insecurity: No Food Insecurity (12/30/2023)   Epic    Worried About Programme Researcher, Broadcasting/film/video in the Last Year: Never true    Ran Out of Food in the Last Year: Never true  Transportation Needs: No Transportation Needs (12/30/2023)   Epic    Lack of Transportation (Medical): No    Lack of Transportation (Non-Medical): No  Physical Activity: Insufficiently Active (03/31/2023)   Exercise Vital Sign    Days of Exercise per Week: 3 days    Minutes of Exercise per Session: 30 min  Stress: No Stress Concern Present (12/30/2023)   [employer] of Occupational Health - Occupational Stress Questionnaire    Feeling of Stress: Only a little  Social Connections: Moderately Integrated (12/30/2023)   Social Connection and Isolation Panel    Frequency of Communication with Friends and Family: More than three times a week    Frequency of Social Gatherings with Friends and Family: Three times a week    Attends Religious Services: More than 4 times per year    Active Member of Clubs or Organizations: Yes    Attends Banker Meetings: 1 to 4 times per year    Marital  Status: Divorced  Intimate Partner Violence: Not on file  Depression (PHQ2-9): High Risk (12/31/2023)   Depression (PHQ2-9)    PHQ-2 Score: 11  Alcohol Screen: Low Risk (12/30/2023)   Alcohol Screen    Last Alcohol Screening Score (AUDIT): 2  Housing: Low Risk (12/30/2023)   Epic    Unable to Pay for Housing in the Last Year: No    Number of Times Moved in the Last Year: 0    Homeless in the Last Year: No  Utilities: Not on file  Health Literacy: Not on file    Family History  Problem Relation Age of Onset   Heart disease Mother    Stroke Mother    Arthritis Mother    Pulmonary fibrosis Father        pulmonary fibrosis   Pulmonary disease Father    Cancer Sister        pancreatic   Diabetes Brother    Colon cancer Neg Hx    Esophageal cancer Neg Hx    Stomach cancer Neg Hx    Rectal cancer Neg Hx      Current Outpatient Medications:    DEXILANT  60 MG capsule, Take 1 capsule (60 mg total) by mouth daily., Disp: 90 capsule, Rfl: 0   DULoxetine  (CYMBALTA ) 60 MG capsule, TAKE 1 CAPSULE BY MOUTH TWICE  DAILY, Disp: 180 capsule, Rfl: 3   ezetimibe  (ZETIA ) 10 MG tablet, Take 1 tablet (10 mg total) by mouth daily., Disp: 30 tablet, Rfl: 0   famotidine  (PEPCID ) 20 MG tablet, Take 40 mg by mouth at bedtime., Disp: , Rfl:    meloxicam  (MOBIC ) 15 MG tablet, Take 1 tablet (15 mg total) by mouth daily., Disp: 90 tablet, Rfl: 0   MULTIPLE VITAMIN PO, Take 1 tablet by mouth daily., Disp: , Rfl:    Polyethyl Glycol-Propyl Glycol (SYSTANE FREE OP), Apply to eye., Disp: , Rfl:    propranolol  (INDERAL ) 10 MG tablet, Take 1 tablet (10 mg total) by mouth 3 (three) times daily., Disp: 270 tablet, Rfl: 3  psyllium (METAMUCIL) 58.6 % powder, Take by mouth. 2 teaspoons nightly, Disp: , Rfl:    RESTASIS 0.05 % ophthalmic emulsion, Place 1 drop into both eyes 2 (two) times daily., Disp: , Rfl:    rosuvastatin  (CRESTOR ) 5 MG tablet, TAKE 1 TABLET(5 MG) BY MOUTH EVERY OTHER DAY, Disp: 90 tablet,  Rfl: 0   solifenacin  (VESICARE ) 10 MG tablet, Take 1 tablet (10 mg total) by mouth daily., Disp: 90 tablet, Rfl: 0   TRULANCE  3 MG TABS, Take 1 tablet (3 mg total) by mouth daily., Disp: 90 tablet, Rfl: 0   valACYclovir  (VALTREX ) 500 MG tablet, Take 1 tablet (500 mg total) by mouth daily., Disp: 90 tablet, Rfl: 1   ZINC-VITAMIN C PO, Take 1 tablet by mouth daily in the afternoon., Disp: , Rfl:   Allergies  Allergen Reactions   Cephalosporins Diarrhea   Codeine Itching   Latex Itching   Pravastatin      Myalgias   Simvastatin     myalgias   Sulfa Antibiotics Diarrhea    History reviewed: allergies, current medications, past family history, past medical history, past social history, past surgical history and problem list    Objective:   Biometrics BP 118/80   Pulse 78   Ht 5' 5.5 (1.664 m)   Wt 229 lb 6.4 oz (104.1 kg)   LMP 12/31/2019   BMI 37.59 kg/m   Wt Readings from Last 3 Encounters:  12/31/23 229 lb 6.4 oz (104.1 kg)  10/18/23 229 lb (103.9 kg)  08/07/23 227 lb 6.4 oz (103.1 kg)   Gen: wd, wn, nad Skin: She has several erythematous areas on her body from recent cryotherapy from dermatology including right distal forearm HEENT: normocephalic, sclerae anicteric, hearing aids present, TMs pearly, nares patent, no discharge or erythema, pharynx normal Oral cavity: MMM, no lesions Neck: supple, no lymphadenopathy, no thyromegaly, no masses, no bruits Heart: RRR, normal S1, S2, no murmurs Lungs: CTA bilaterally, no wheezes, rhonchi, or rales Abdomen: +bs, soft, non tender, non distended, no masses, no hepatomegaly, no splenomegaly Musculoskeletal: nontender, no swelling, no obvious deformity Extremities: no edema, no cyanosis, no clubbing Pulses: 2+ symmetric, upper and lower extremities, normal cap refill Neurological: alert, oriented x 3, CN2-12 intact, strength normal upper extremities and lower extremities, sensation normal throughout, DTRs 2+ throughout, no  cerebellar signs, gait normal Psychiatric: normal affect, behavior normal, pleasant  Breast/GU-deferred to GYN    Assessment:   Encounter Diagnoses  Name Primary?   Encounter for health maintenance examination in adult Yes   Vaccine counseling    Hyperlipidemia, unspecified hyperlipidemia type    Gastroesophageal reflux disease, unspecified whether esophagitis present    High risk medication use    Chronic constipation    Anxiety and depression    Encounter for hepatitis C screening test for low risk patient    Loose stools    Screening for heart disease    Screening for hematuria or proteinuria       Plan:   This visit was a preventative care visit, also known as wellness visit or routine physical.   Topics typically include healthy lifestyle, diet, exercise, preventative care, vaccinations, sick and well care, proper use of emergency dept and after hours care, as well as other concerns.     Recommendations: Continue to return yearly for your annual wellness and preventative care visits.  This gives us  a chance to discuss healthy lifestyle, exercise, vaccinations, review your chart record, and perform screenings where appropriate.  I recommend you  see your eye doctor yearly for routine vision care.  I recommend you see your dentist yearly for routine dental care including hygiene visits twice yearly.   Vaccination recommendations were reviewed Immunization History  Administered Date(s) Administered    sv, Bivalent, Protein Subunit Rsvpref,pf (Abrysvo) 08/30/2021   INFLUENZA, HIGH DOSE SEASONAL PF 09/30/2023   Influenza Inj Mdck Quad Pf 09/18/2017   Influenza Split 10/03/2021   Influenza Whole 10/28/2012   Influenza,inj,Quad PF,6+ Mos 10/12/2015, 10/10/2016, 09/08/2018   Influenza,inj,quad, With Preservative 10/15/2016   Influenza-Unspecified 11/29/2013, 10/26/2014, 10/15/2019, 10/13/2020, 10/15/2022   PFIZER Comirnaty(Gray Top)Covid-19 Tri-Sucrose Vaccine 05/04/2020    PFIZER(Purple Top)SARS-COV-2 Vaccination 02/23/2019, 03/20/2019, 10/15/2019   PNEUMOCOCCAL CONJUGATE-20 01/10/2021   Pfizer(Comirnaty)Fall Seasonal Vaccine 12 years and older 10/03/2021   Tdap 06/04/2011, 10/23/2019, 09/06/2021   Zoster Recombinant(Shingrix ) 11/28/2018, 01/28/2019   Zoster, Live 11/29/2013     Screening for cancer: Colon cancer screening: Reviewed 2017 colonoscopy report. Repeat in 2027.  Stool hemoccult negative in 2021.  FIT test negative 11/2022.  Breast cancer screening: You should perform a self breast exam monthly.   We reviewed recommendations for regular mammograms and breast cancer screening. Mammogram 02/2023 reviewed  Cervical cancer screening: We reviewed recommendations for pap smear screening. Age >32, prior pap with Dr. Gorge, gyn  Skin cancer screening: Check your skin regularly for new changes, growing lesions, or other lesions of concern Continue skin surveillance every 3 months with dermatology, Dr. Rolan Molt  Lung cancer screening: If you have a greater than 20 pack year history of tobacco use, then you may qualify for lung cancer screening with a chest CT scan.   Please call your insurance company to inquire about coverage for this test.  We currently don't have screenings for other cancers besides breast, cervical, colon, and lung cancers.  If you have a strong family history of cancer or have other cancer screening concerns, please let me know.    Bone health: Get at least 150 minutes of aerobic exercise weekly Get weight bearing exercise at least once weekly Bone density test:  A bone density test is an imaging test that uses a type of X-ray to measure the amount of calcium  and other minerals in your bones. The test may be used to diagnose or screen you for a condition that causes weak or thin bones (osteoporosis), predict your risk for a broken bone (fracture), or determine how well your osteoporosis treatment is working. The bone  density test is recommended for females 65 and older, or females or males <65 if certain risk factors such as thyroid  disease, long term use of steroids such as for asthma or rheumatological issues, vitamin D  deficiency, estrogen deficiency, family history of osteoporosis, self or family history of fragility fracture in first degree relative.  Bone density test normal 2022, repeat in 2027   Heart health: Get at least 150 minutes of aerobic exercise weekly Limit alcohol It is important to maintain a healthy blood pressure and healthy cholesterol numbers  Heart disease screening: Screening for heart disease includes screening for blood pressure, fasting lipids, glucose/diabetes screening, BMI height to weight ratio, reviewed of smoking status, physical activity, and diet.    Goals include blood pressure 120/80 or less, maintaining a healthy lipid/cholesterol profile, preventing diabetes or keeping diabetes numbers under good control, not smoking or using tobacco products, exercising most days per week or at least 150 minutes per week of exercise, and eating healthy variety of fruits and vegetables, healthy oils, and avoiding unhealthy food choices  like fried food, fast food, high sugar and high cholesterol foods.    Other tests may possibly include EKG test, CT coronary calcium  score, echocardiogram, exercise treadmill stress test.   Consider CT coronary test or referral to cardiology  I reviewed 10/2023 cardiology eval notes.   Medical care options: I recommend you continue to seek care here first for routine care.  We try really hard to have available appointments Monday through Friday daytime hours for sick visits, acute visits, and physicals.  Urgent care should be used for after hours and weekends for significant issues that cannot wait till the next day.  The emergency department should be used for significant potentially life-threatening emergencies.  The emergency department is  expensive, can often have long wait times for less significant concerns, so try to utilize primary care, urgent care, or telemedicine when possible to avoid unnecessary trips to the emergency department.  Virtual visits and telemedicine have been introduced since the pandemic started in 2020, and can be convenient ways to receive medical care.  We offer virtual appointments as well to assist you in a variety of options to seek medical care.   Advanced Directives: I recommend you consider completing a Health Care Power of Attorney and Living Will.   These documents respect your wishes and help alleviate burdens on your loved ones if you were to become terminally ill or be in a position to need those documents enforced.    You can complete Advanced Directives yourself, have them notarized, then have copies made for our office, for you and for anybody you feel should have them in safe keeping.  Or, you can have an attorney prepare these documents.   If you haven't updated your Last Will and Testament in a while, it may be worthwhile having an attorney prepare these documents together and save on some costs.       Significant issues: Palpitations -I reviewed her recent October 2025 cardiology notes.  Continue propranolol  10 mg 3 times a day  Chronic constipation -Does relatively well on Trulance  3 mg daily  Overactive bladder -Continue Vesicare  10 mg daily  Obesity  Hyperlipidemia -Continue Zetia  10 mg daily and Crestor  5 mg every other day  Depression anxiety -Continue Cymbalta  60 mg twice daily but referral to counseling and psychiatry  GERD -Continue Dexilant  60 mg daily, Pepcid  20 mg daily nightly   Labs today ordered for C. difficile screening given her increased risk on Dexilant  and concern for loose stools  She will return for fasting labs as she is not fasting today    Kristi Avila was seen today for annual exam.  Diagnoses and all orders for this visit:  Encounter for health  maintenance examination in adult -     CT CARDIAC SCORING (SELF PAY ONLY); Future -     Clostridium difficile EIA -     Urinalysis, Routine w reflex microscopic -     Hepatitis B surface antigen; Future -     Hepatitis C antibody; Future -     HIV Antibody (routine testing w rflx); Future  Vaccine counseling  Hyperlipidemia, unspecified hyperlipidemia type  Gastroesophageal reflux disease, unspecified whether esophagitis present -     Clostridium difficile EIA  High risk medication use -     Clostridium difficile EIA  Chronic constipation  Anxiety and depression -     Ambulatory referral to Psychiatry  Encounter for hepatitis C screening test for low risk patient -     Hepatitis B surface  antigen; Future -     Hepatitis C antibody; Future -     HIV Antibody (routine testing w rflx); Future  Loose stools -     Clostridium difficile EIA  Screening for heart disease -     CT CARDIAC SCORING (SELF PAY ONLY); Future  Screening for hematuria or proteinuria -     Urinalysis, Routine w reflex microscopic    Return soon for fasting labs

## 2024-01-01 ENCOUNTER — Ambulatory Visit: Payer: Self-pay | Admitting: Medical

## 2024-01-01 ENCOUNTER — Other Ambulatory Visit

## 2024-01-01 DIAGNOSIS — Z131 Encounter for screening for diabetes mellitus: Secondary | ICD-10-CM

## 2024-01-01 DIAGNOSIS — R350 Frequency of micturition: Secondary | ICD-10-CM

## 2024-01-01 DIAGNOSIS — E785 Hyperlipidemia, unspecified: Secondary | ICD-10-CM

## 2024-01-01 DIAGNOSIS — Z79899 Other long term (current) drug therapy: Secondary | ICD-10-CM

## 2024-01-01 DIAGNOSIS — Z1159 Encounter for screening for other viral diseases: Secondary | ICD-10-CM

## 2024-01-01 DIAGNOSIS — Z0000 Encounter for general adult medical examination without abnormal findings: Secondary | ICD-10-CM

## 2024-01-01 DIAGNOSIS — K219 Gastro-esophageal reflux disease without esophagitis: Secondary | ICD-10-CM

## 2024-01-01 DIAGNOSIS — R195 Other fecal abnormalities: Secondary | ICD-10-CM

## 2024-01-01 LAB — LIPID PANEL

## 2024-01-01 LAB — URINALYSIS, ROUTINE W REFLEX MICROSCOPIC
Bilirubin, UA: NEGATIVE
Glucose, UA: NEGATIVE
Ketones, UA: NEGATIVE
Leukocytes,UA: NEGATIVE
Nitrite, UA: NEGATIVE
RBC, UA: NEGATIVE
Specific Gravity, UA: 1.021 (ref 1.005–1.030)
Urobilinogen, Ur: 0.2 mg/dL (ref 0.2–1.0)
pH, UA: 6 (ref 5.0–7.5)

## 2024-01-01 LAB — MICROSCOPIC EXAMINATION
Casts: NONE SEEN /LPF
RBC, Urine: NONE SEEN /HPF (ref 0–2)
WBC, UA: NONE SEEN /HPF (ref 0–5)

## 2024-01-01 NOTE — Telephone Encounter (Signed)
 Pt has already came for labs this morning and we didn't get a urine on her. We are unable to add on a urine culture as it has to be in a special tube

## 2024-01-01 NOTE — Progress Notes (Signed)
 Results through MyChart

## 2024-01-02 ENCOUNTER — Other Ambulatory Visit (HOSPITAL_BASED_OUTPATIENT_CLINIC_OR_DEPARTMENT_OTHER): Payer: Self-pay

## 2024-01-02 ENCOUNTER — Other Ambulatory Visit: Payer: Self-pay | Admitting: Medical

## 2024-01-02 ENCOUNTER — Other Ambulatory Visit: Payer: Self-pay

## 2024-01-02 ENCOUNTER — Other Ambulatory Visit (HOSPITAL_COMMUNITY): Payer: Self-pay

## 2024-01-02 LAB — COMPREHENSIVE METABOLIC PANEL WITH GFR
ALT: 22 IU/L (ref 0–32)
AST: 18 IU/L (ref 0–40)
Albumin: 4.6 g/dL (ref 3.9–4.9)
Alkaline Phosphatase: 82 IU/L (ref 49–135)
BUN/Creatinine Ratio: 15 (ref 12–28)
BUN: 13 mg/dL (ref 8–27)
Bilirubin Total: 0.4 mg/dL (ref 0.0–1.2)
CO2: 24 mmol/L (ref 20–29)
Calcium: 9.6 mg/dL (ref 8.7–10.3)
Chloride: 103 mmol/L (ref 96–106)
Creatinine, Ser: 0.86 mg/dL (ref 0.57–1.00)
Globulin, Total: 2 g/dL (ref 1.5–4.5)
Glucose: 101 mg/dL — AB (ref 70–99)
Potassium: 4.4 mmol/L (ref 3.5–5.2)
Sodium: 141 mmol/L (ref 134–144)
Total Protein: 6.6 g/dL (ref 6.0–8.5)
eGFR: 74 mL/min/1.73 (ref >59)

## 2024-01-02 LAB — TSH: TSH: 2.74 u[IU]/mL (ref 0.450–4.500)

## 2024-01-02 LAB — CBC
Hematocrit: 41.4 % (ref 34.0–46.6)
Hemoglobin: 13.4 g/dL (ref 11.1–15.9)
MCH: 30.6 pg (ref 26.6–33.0)
MCHC: 32.4 g/dL (ref 31.5–35.7)
MCV: 95 fL (ref 79–97)
Platelets: 322 x10E3/uL (ref 150–450)
RBC: 4.38 x10E6/uL (ref 3.77–5.28)
RDW: 12.8 % (ref 11.7–15.4)
WBC: 6.5 x10E3/uL (ref 3.4–10.8)

## 2024-01-02 LAB — LIPID PANEL
Cholesterol, Total: 184 mg/dL (ref 100–199)
HDL: 56 mg/dL (ref >39)
LDL CALC COMMENT:: 3.3 ratio (ref 0.0–4.4)
LDL Chol Calc (NIH): 107 mg/dL — AB (ref 0–99)
Triglycerides: 121 mg/dL (ref 0–149)
VLDL Cholesterol Cal: 21 mg/dL (ref 5–40)

## 2024-01-02 LAB — HEPATITIS B SURFACE ANTIGEN

## 2024-01-02 LAB — HEPATITIS C ANTIBODY: Hep C Virus Ab: NONREACTIVE

## 2024-01-02 LAB — HEMOGLOBIN A1C
Est. average glucose Bld gHb Est-mCnc: 117 mg/dL
Hgb A1c MFr Bld: 5.7 % — ABNORMAL HIGH (ref 4.8–5.6)

## 2024-01-02 LAB — HIV ANTIBODY (ROUTINE TESTING W REFLEX): HIV Screen 4th Generation wRfx: NONREACTIVE

## 2024-01-02 MED ORDER — EZETIMIBE 10 MG PO TABS
10.0000 mg | ORAL_TABLET | Freq: Every day | ORAL | 0 refills | Status: AC
  Filled 2024-01-02 – 2024-01-25 (×3): qty 90, 90d supply, fill #0

## 2024-01-02 MED ORDER — ROSUVASTATIN CALCIUM 5 MG PO TABS
5.0000 mg | ORAL_TABLET | ORAL | 0 refills | Status: AC
  Filled 2024-01-02 – 2024-01-25 (×4): qty 90, 180d supply, fill #0

## 2024-01-02 MED ORDER — VALACYCLOVIR HCL 500 MG PO TABS
500.0000 mg | ORAL_TABLET | Freq: Every day | ORAL | 1 refills | Status: AC
  Filled 2024-01-02: qty 90, 90d supply, fill #0

## 2024-01-02 NOTE — Progress Notes (Signed)
 Results through MyChart

## 2024-01-03 ENCOUNTER — Other Ambulatory Visit (HOSPITAL_COMMUNITY): Payer: Self-pay

## 2024-01-03 ENCOUNTER — Other Ambulatory Visit: Payer: Self-pay

## 2024-01-03 LAB — URINE CULTURE: Organism ID, Bacteria: NO GROWTH

## 2024-01-03 LAB — CLOSTRIDIUM DIFFICILE EIA

## 2024-01-03 MED ORDER — DULOXETINE HCL 60 MG PO CPEP
60.0000 mg | ORAL_CAPSULE | Freq: Two times a day (BID) | ORAL | 3 refills | Status: AC
  Filled 2024-01-03: qty 180, 90d supply, fill #0

## 2024-01-03 NOTE — Progress Notes (Signed)
 Results through MyChart

## 2024-01-04 ENCOUNTER — Other Ambulatory Visit (HOSPITAL_COMMUNITY): Payer: Self-pay

## 2024-01-05 ENCOUNTER — Other Ambulatory Visit (HOSPITAL_COMMUNITY): Payer: Self-pay

## 2024-01-21 ENCOUNTER — Ambulatory Visit (HOSPITAL_COMMUNITY)
Admission: RE | Admit: 2024-01-21 | Discharge: 2024-01-21 | Disposition: A | Discharge status: Home / Self Care | Payer: Self-pay | Source: Ambulatory Visit | Attending: Cardiology | Admitting: Cardiology

## 2024-01-21 ENCOUNTER — Ambulatory Visit: Payer: Self-pay

## 2024-01-21 DIAGNOSIS — Z0000 Encounter for general adult medical examination without abnormal findings: Secondary | ICD-10-CM | POA: Insufficient documentation

## 2024-01-21 DIAGNOSIS — Z136 Encounter for screening for cardiovascular disorders: Secondary | ICD-10-CM | POA: Insufficient documentation

## 2024-01-23 NOTE — Progress Notes (Signed)
 Results through MyChart

## 2024-01-25 ENCOUNTER — Other Ambulatory Visit: Payer: Self-pay

## 2024-01-26 ENCOUNTER — Other Ambulatory Visit: Payer: Self-pay

## 2024-01-30 ENCOUNTER — Telehealth: Payer: Self-pay

## 2024-01-30 NOTE — Progress Notes (Signed)
" ° °  01/30/2024  Patient ID: Kristi Avila, female   DOB: 1955/09/09, 69 y.o.   MRN: [id number]  Contacted [employer] to follow up on PAP application for Dexilant  renewal for 2026. Patient has been APPROVED through 01/14/25.  Will coordinate with CPhT to follow up on Trulance  application through [employer].  [name] VEAR Lindau, PharmD Clinical Pharmacist [phone]  "

## 2024-02-04 NOTE — Telephone Encounter (Signed)
 Notified patient. She reports she mailed in the LIS denial letter to company early last week. Can we follow up with company on Thursday and see if they have received via mial yet?

## 2024-02-11 ENCOUNTER — Telehealth: Payer: Self-pay

## 2024-02-11 NOTE — Telephone Encounter (Signed)
 Received approval letter from [employer] (Trulance ) thru 01/14/2025,approval letter index.

## 2024-02-11 NOTE — Progress Notes (Signed)
" ° °  02/11/2024  Patient ID: Kristi Avila, female   DOB: 1955-09-30, 69 y.o.   MRN: [id number]  Attempted to notify patient that the Trulance  PAP company still has not received her LIS Denial letter that she mailed into them. Had to leave a voicemail, will send mychart message.  [name] VEAR Lindau, PharmD Clinical Pharmacist [phone]  "

## 2024-02-20 ENCOUNTER — Encounter: Payer: Self-pay | Admitting: Family Medicine

## 2024-02-20 ENCOUNTER — Ambulatory Visit: Admitting: Family Medicine

## 2024-02-20 VITALS — BP 120/84 | HR 71 | Temp 98.3°F | Ht 65.5 in | Wt 237.0 lb

## 2024-02-20 DIAGNOSIS — R0982 Postnasal drip: Secondary | ICD-10-CM

## 2024-02-20 DIAGNOSIS — H9202 Otalgia, left ear: Secondary | ICD-10-CM

## 2024-02-20 DIAGNOSIS — J069 Acute upper respiratory infection, unspecified: Secondary | ICD-10-CM

## 2024-02-20 DIAGNOSIS — J029 Acute pharyngitis, unspecified: Secondary | ICD-10-CM

## 2024-02-20 LAB — POCT RAPID STREP A (OFFICE): Rapid Strep A Screen: NEGATIVE

## 2024-02-20 MED ORDER — ACETIC ACID 2 % OT SOLN
3.0000 [drp] | Freq: Three times a day (TID) | OTIC | 0 refills | Status: AC | PRN
  Filled 2024-02-20: qty 15, 34d supply, fill #0

## 2024-02-20 NOTE — Progress Notes (Unsigned)
 I,Jameka J Llittleton, CMA,acting as a neurosurgeon for [employer], NP.,have documented all relevant documentation on the behalf of Bruna Creighton, NP,as directed by  Bruna Creighton, NP while in the presence of Bruna Creighton, NP.  Subjective:  Patient ID: Kristi Avila , female    DOB: 08-04-1955 , 69 y.o.   MRN: [id number]  Chief Complaint  Patient presents with   Ear Pain    Patient presents today for ear pain. She reports the pain Is in her left ear. The pain started today. She also reports her throat started hurting as well today.     HPI  HPI   Past Medical History:  Diagnosis Date   Arthritis    Barrett's esophagus with esophagitis 07/23/2012   EGD: CLO test results negative. pathology: Barretss: Repeat EGD 3 years   Blood transfusion without reported diagnosis    remote past   Cancer (HCC)    squamous cell CA- on legs   Depression    GERD (gastroesophageal reflux disease)    h/o peptic ulcer   High cholesterol    HSV-2 infection    anal   Obesity    Overactive bladder    Reflux      Family History  Problem Relation Age of Onset   Heart disease Mother    Stroke Mother    Arthritis Mother    Pulmonary fibrosis Father        pulmonary fibrosis   Pulmonary disease Father    Cancer Sister        pancreatic   Diabetes Brother    Colon cancer Neg Hx    Esophageal cancer Neg Hx    Stomach cancer Neg Hx    Rectal cancer Neg Hx     Current Medications[1]   Allergies[2]   Review of Systems   Today's Vitals   02/20/24 1605  Temp: 98.1 F (36.7 C)  TempSrc: Oral  Weight: 237 lb (107.5 kg)  Height: 5' 5.5 (1.664 m)   Body mass index is 38.84 kg/m.  Wt Readings from Last 3 Encounters:  02/20/24 237 lb (107.5 kg)  12/31/23 229 lb 6.4 oz (104.1 kg)  10/18/23 229 lb (103.9 kg)    The 10-year ASCVD risk score (Arnett DK, et al., 2019) is: 5.7%   Values used to calculate the score:     Age: 30 years     Clinically relevant sex: Female     Is Non-Hispanic African  American: No     Diabetic: No     Tobacco smoker: No     Systolic Blood Pressure: 118 mmHg     Is BP treated: No     HDL Cholesterol: 56 mg/dL     Total Cholesterol: 184 mg/dL  Objective:  Physical Exam      Assessment And Plan:   Assessment & Plan Sore throat  Left ear pain   No orders of the defined types were placed in this encounter.    Return if symptoms worsen or fail to improve.  Patient was given opportunity to ask questions. Patient verbalized understanding of the plan and was able to repeat key elements of the plan. All questions were answered to their satisfaction.    I, Bruna Creighton, NP, have reviewed all documentation for this visit. The documentation on 02/20/24 for the exam, diagnosis, procedures, and orders are all accurate and complete.   IF YOU HAVE BEEN REFERRED TO A SPECIALIST, IT MAY TAKE 1-2 WEEKS TO SCHEDULE/PROCESS THE REFERRAL. IF YOU  HAVE NOT HEARD FROM US /SPECIALIST IN TWO WEEKS, PLEASE GIVE US  A CALL AT [phone].     [1]  Current Outpatient Medications:    DEXILANT  60 MG capsule, Take 1 capsule (60 mg total) by mouth daily., Disp: 90 capsule, Rfl: 0   DULoxetine  (CYMBALTA ) 60 MG capsule, Take 1 capsule (60 mg total) by mouth 2 (two) times daily., Disp: 180 capsule, Rfl: 3   ezetimibe  (ZETIA ) 10 MG tablet, Take 1 tablet (10 mg total) by mouth daily., Disp: 90 tablet, Rfl: 0   famotidine  (PEPCID ) 20 MG tablet, Take 40 mg by mouth at bedtime., Disp: , Rfl:    meloxicam  (MOBIC ) 15 MG tablet, Take 1 tablet (15 mg total) by mouth daily., Disp: 90 tablet, Rfl: 0   MULTIPLE VITAMIN PO, Take 1 tablet by mouth daily., Disp: , Rfl:    Polyethyl Glycol-Propyl Glycol (SYSTANE FREE OP), Apply to eye., Disp: , Rfl:    propranolol  (INDERAL ) 10 MG tablet, Take 1 tablet (10 mg total) by mouth 3 (three) times daily., Disp: 270 tablet, Rfl: 3   psyllium (METAMUCIL) 58.6 % powder, Take by mouth. 2 teaspoons nightly, Disp: , Rfl:    RESTASIS 0.05 %  ophthalmic emulsion, Place 1 drop into both eyes 2 (two) times daily., Disp: , Rfl:    rosuvastatin  (CRESTOR ) 5 MG tablet, Take 1 tablet (5 mg total) by mouth every other day., Disp: 90 tablet, Rfl: 0   solifenacin  (VESICARE ) 10 MG tablet, Take 1 tablet (10 mg total) by mouth daily., Disp: 90 tablet, Rfl: 0   TRULANCE  3 MG TABS, Take 1 tablet (3 mg total) by mouth daily., Disp: 90 tablet, Rfl: 0   valACYclovir  (VALTREX ) 500 MG tablet, Take 1 tablet (500 mg total) by mouth daily., Disp: 90 tablet, Rfl: 1   ZINC-VITAMIN C PO, Take 1 tablet by mouth daily in the afternoon., Disp: , Rfl:  [2]  Allergies Allergen Reactions   Cephalosporins Diarrhea   Codeine Itching   Latex Itching   Pravastatin      Myalgias   Simvastatin     myalgias   Sulfa Antibiotics Diarrhea

## 2024-02-21 ENCOUNTER — Other Ambulatory Visit: Payer: Self-pay

## 2024-02-21 ENCOUNTER — Other Ambulatory Visit (HOSPITAL_COMMUNITY): Payer: Self-pay

## 2024-03-12 ENCOUNTER — Other Ambulatory Visit

## 2024-03-25 ENCOUNTER — Encounter: Admitting: Medical

## 2024-10-27 ENCOUNTER — Ambulatory Visit

## 2025-01-05 ENCOUNTER — Encounter: Admitting: Medical
